# Patient Record
Sex: Male | Born: 1935 | Race: White | Hispanic: No | Marital: Married | Smoking: Never smoker
Health system: Southern US, Community
[De-identification: ages and names within clinical notes are randomized; demographics above are authoritative.]

## PROBLEM LIST (undated history)

## (undated) DIAGNOSIS — I2510 Atherosclerotic heart disease of native coronary artery without angina pectoris: Secondary | ICD-10-CM

## (undated) DIAGNOSIS — D696 Thrombocytopenia, unspecified: Secondary | ICD-10-CM

## (undated) DIAGNOSIS — C4492 Squamous cell carcinoma of skin, unspecified: Secondary | ICD-10-CM

## (undated) DIAGNOSIS — C61 Malignant neoplasm of prostate: Secondary | ICD-10-CM

## (undated) DIAGNOSIS — D469 Myelodysplastic syndrome, unspecified: Secondary | ICD-10-CM

## (undated) DIAGNOSIS — D649 Anemia, unspecified: Secondary | ICD-10-CM

## (undated) DIAGNOSIS — E785 Hyperlipidemia, unspecified: Secondary | ICD-10-CM

## (undated) DIAGNOSIS — K760 Fatty (change of) liver, not elsewhere classified: Secondary | ICD-10-CM

## (undated) DIAGNOSIS — I951 Orthostatic hypotension: Secondary | ICD-10-CM

## (undated) DIAGNOSIS — I10 Essential (primary) hypertension: Secondary | ICD-10-CM

## (undated) HISTORY — DX: Squamous cell carcinoma of skin, unspecified: C44.92

## (undated) HISTORY — PX: APPENDECTOMY: SHX54

## (undated) HISTORY — DX: Essential (primary) hypertension: I10

## (undated) HISTORY — DX: Anemia, unspecified: D64.9

## (undated) HISTORY — DX: Malignant neoplasm of prostate: C61

## (undated) HISTORY — DX: Hyperlipidemia, unspecified: E78.5

## (undated) HISTORY — PX: CARDIAC SURGERY: SHX584

## (undated) HISTORY — DX: Orthostatic hypotension: I95.1

## (undated) HISTORY — PX: INGUINAL HERNIA REPAIR: SUR1180

## (undated) HISTORY — DX: Fatty (change of) liver, not elsewhere classified: K76.0

## (undated) HISTORY — DX: Thrombocytopenia, unspecified: D69.6

## (undated) HISTORY — DX: Atherosclerotic heart disease of native coronary artery without angina pectoris: I25.10

## (undated) HISTORY — PX: CORONARY ANGIOPLASTY WITH STENT PLACEMENT: SHX49

---

## 1997-11-18 ENCOUNTER — Encounter: Admission: RE | Admit: 1997-11-18 | Discharge: 1998-02-16 | Payer: Self-pay | Admitting: Radiation Oncology

## 2001-12-31 ENCOUNTER — Ambulatory Visit (HOSPITAL_COMMUNITY): Admission: RE | Admit: 2001-12-31 | Discharge: 2001-12-31 | Payer: Self-pay | Admitting: Family Medicine

## 2001-12-31 ENCOUNTER — Encounter: Payer: Self-pay | Admitting: Family Medicine

## 2002-02-15 ENCOUNTER — Emergency Department (HOSPITAL_COMMUNITY): Admission: EM | Admit: 2002-02-15 | Discharge: 2002-02-15 | Payer: Self-pay | Admitting: Emergency Medicine

## 2002-02-15 ENCOUNTER — Encounter: Payer: Self-pay | Admitting: Emergency Medicine

## 2003-09-29 ENCOUNTER — Ambulatory Visit (HOSPITAL_COMMUNITY): Admission: RE | Admit: 2003-09-29 | Discharge: 2003-09-29 | Payer: Self-pay | Admitting: Family Medicine

## 2003-10-08 ENCOUNTER — Ambulatory Visit (HOSPITAL_COMMUNITY): Admission: RE | Admit: 2003-10-08 | Discharge: 2003-10-08 | Payer: Self-pay | Admitting: Family Medicine

## 2008-02-23 ENCOUNTER — Emergency Department (HOSPITAL_COMMUNITY): Admission: EM | Admit: 2008-02-23 | Discharge: 2008-02-23 | Payer: Self-pay | Admitting: Emergency Medicine

## 2008-07-27 ENCOUNTER — Ambulatory Visit: Payer: Self-pay | Admitting: Gastroenterology

## 2008-07-29 ENCOUNTER — Ambulatory Visit: Payer: Self-pay | Admitting: Gastroenterology

## 2008-07-29 ENCOUNTER — Ambulatory Visit (HOSPITAL_COMMUNITY): Admission: RE | Admit: 2008-07-29 | Discharge: 2008-07-29 | Payer: Self-pay | Admitting: Gastroenterology

## 2008-12-06 ENCOUNTER — Encounter (INDEPENDENT_AMBULATORY_CARE_PROVIDER_SITE_OTHER): Payer: Self-pay | Admitting: *Deleted

## 2008-12-06 LAB — CONVERTED CEMR LAB
AST: 21 units/L
Albumin: 4.7 g/dL
CO2: 25 meq/L
Calcium: 10.2 mg/dL
Glucose, Bld: 104 mg/dL
LDL Cholesterol: 78 mg/dL
Potassium: 4.9 meq/L
Sodium: 144 meq/L
Total Protein: 7.2 g/dL

## 2009-10-02 ENCOUNTER — Encounter: Payer: Self-pay | Admitting: Emergency Medicine

## 2009-10-02 ENCOUNTER — Encounter (INDEPENDENT_AMBULATORY_CARE_PROVIDER_SITE_OTHER): Payer: Self-pay | Admitting: *Deleted

## 2009-10-02 ENCOUNTER — Ambulatory Visit: Payer: Self-pay | Admitting: Cardiology

## 2009-10-02 ENCOUNTER — Inpatient Hospital Stay (HOSPITAL_COMMUNITY): Admission: EM | Admit: 2009-10-02 | Discharge: 2009-10-10 | Payer: Self-pay | Admitting: Cardiology

## 2009-10-12 ENCOUNTER — Encounter: Payer: Self-pay | Admitting: Cardiology

## 2009-10-13 LAB — CONVERTED CEMR LAB
HCT: 30.5 % — ABNORMAL LOW (ref 39.0–52.0)
Hemoglobin: 10 g/dL — ABNORMAL LOW (ref 13.0–17.0)
MCHC: 32.8 g/dL (ref 30.0–36.0)
MCV: 101 fL — ABNORMAL HIGH (ref 78.0–100.0)
Platelets: 138 10*3/uL — ABNORMAL LOW (ref 150–400)
RBC: 3.02 M/uL — ABNORMAL LOW (ref 4.22–5.81)
RDW: 13.7 % (ref 11.5–15.5)
WBC: 5.1 10*3/uL (ref 4.0–10.5)

## 2009-10-20 ENCOUNTER — Encounter (INDEPENDENT_AMBULATORY_CARE_PROVIDER_SITE_OTHER): Payer: Self-pay | Admitting: *Deleted

## 2009-10-24 ENCOUNTER — Ambulatory Visit: Payer: Self-pay | Admitting: Cardiology

## 2009-10-24 ENCOUNTER — Encounter: Payer: Self-pay | Admitting: Adult Health

## 2009-10-24 DIAGNOSIS — N259 Disorder resulting from impaired renal tubular function, unspecified: Secondary | ICD-10-CM | POA: Insufficient documentation

## 2009-10-25 ENCOUNTER — Encounter: Payer: Self-pay | Admitting: Adult Health

## 2009-10-25 ENCOUNTER — Encounter (INDEPENDENT_AMBULATORY_CARE_PROVIDER_SITE_OTHER): Payer: Self-pay | Admitting: *Deleted

## 2009-10-25 ENCOUNTER — Telehealth (INDEPENDENT_AMBULATORY_CARE_PROVIDER_SITE_OTHER): Payer: Self-pay

## 2009-10-25 LAB — CONVERTED CEMR LAB
BUN: 19 mg/dL
Basophils Absolute: 0 10*3/uL (ref 0.0–0.1)
Basophils Relative: 0 % (ref 0–1)
Calcium: 9.2 mg/dL
Calcium: 9.2 mg/dL (ref 8.4–10.5)
Chloride: 106 meq/L
Creatinine, Ser: 1.04 mg/dL
Glucose, Bld: 88 mg/dL (ref 70–99)
HCT: 36.7 %
Hemoglobin: 11.9 g/dL
Hemoglobin: 11.9 g/dL — ABNORMAL LOW (ref 13.0–17.0)
Lymphocytes Relative: 37 % (ref 12–46)
MCHC: 32.4 g/dL (ref 30.0–36.0)
MCV: 100.8 fL
Monocytes Absolute: 0.5 10*3/uL (ref 0.1–1.0)
Neutro Abs: 3.1 10*3/uL (ref 1.7–7.7)
Neutrophils Relative %: 52 % (ref 43–77)
Platelets: 172 10*3/uL
Potassium: 4.6 meq/L (ref 3.5–5.3)
RBC: 3.64 M/uL — ABNORMAL LOW (ref 4.22–5.81)
RDW: 14 % (ref 11.5–15.5)
Sodium: 141 meq/L (ref 135–145)
WBC: 6.1 10*3/uL

## 2009-11-07 ENCOUNTER — Ambulatory Visit: Payer: Self-pay | Admitting: Cardiology

## 2009-11-08 ENCOUNTER — Encounter: Payer: Self-pay | Admitting: Cardiology

## 2009-11-10 ENCOUNTER — Encounter (INDEPENDENT_AMBULATORY_CARE_PROVIDER_SITE_OTHER): Payer: Self-pay | Admitting: *Deleted

## 2009-11-21 DIAGNOSIS — I447 Left bundle-branch block, unspecified: Secondary | ICD-10-CM | POA: Insufficient documentation

## 2009-11-21 DIAGNOSIS — I2510 Atherosclerotic heart disease of native coronary artery without angina pectoris: Secondary | ICD-10-CM

## 2009-11-22 ENCOUNTER — Ambulatory Visit: Payer: Self-pay | Admitting: Cardiology

## 2009-12-12 ENCOUNTER — Encounter (HOSPITAL_COMMUNITY): Admission: RE | Admit: 2009-12-12 | Discharge: 2010-01-11 | Payer: Self-pay | Admitting: Cardiology

## 2009-12-16 ENCOUNTER — Encounter: Payer: Self-pay | Admitting: Cardiology

## 2010-01-04 ENCOUNTER — Telehealth (INDEPENDENT_AMBULATORY_CARE_PROVIDER_SITE_OTHER): Payer: Self-pay | Admitting: *Deleted

## 2010-02-09 ENCOUNTER — Encounter: Payer: Self-pay | Admitting: Cardiology

## 2010-07-20 ENCOUNTER — Ambulatory Visit (HOSPITAL_COMMUNITY)
Admission: RE | Admit: 2010-07-20 | Discharge: 2010-07-20 | Payer: Self-pay | Source: Home / Self Care | Admitting: Ophthalmology

## 2010-08-03 ENCOUNTER — Ambulatory Visit (HOSPITAL_COMMUNITY)
Admission: RE | Admit: 2010-08-03 | Discharge: 2010-08-03 | Payer: Self-pay | Source: Home / Self Care | Attending: Ophthalmology | Admitting: Ophthalmology

## 2010-08-18 ENCOUNTER — Ambulatory Visit: Payer: Self-pay | Admitting: Cardiology

## 2010-08-18 ENCOUNTER — Encounter (INDEPENDENT_AMBULATORY_CARE_PROVIDER_SITE_OTHER): Payer: Self-pay | Admitting: *Deleted

## 2010-08-22 ENCOUNTER — Encounter: Payer: Self-pay | Admitting: Cardiology

## 2010-08-22 ENCOUNTER — Encounter (INDEPENDENT_AMBULATORY_CARE_PROVIDER_SITE_OTHER): Payer: Self-pay | Admitting: *Deleted

## 2010-08-22 LAB — CONVERTED CEMR LAB
ALT: 16 units/L (ref 0–53)
AST: 16 units/L (ref 0–37)
Albumin: 4.3 g/dL (ref 3.5–5.2)
BUN: 17 mg/dL
BUN: 17 mg/dL (ref 6–23)
Basophils Relative: 1 %
Basophils Relative: 1 % (ref 0–1)
CO2: 27 meq/L
CO2: 27 meq/L (ref 19–32)
Calcium: 9.9 mg/dL (ref 8.4–10.5)
Chloride: 108 meq/L
Chloride: 108 meq/L (ref 96–112)
Cholesterol: 151 mg/dL (ref 0–200)
Creatinine, Ser: 1.09 mg/dL
Eosinophils Absolute: 0.3 10*3/uL
Glucose, Bld: 108 mg/dL
HCT: 37.2 %
HDL: 32 mg/dL — ABNORMAL LOW (ref >39)
Hemoglobin: 12.3 g/dL
Hemoglobin: 12.3 g/dL — ABNORMAL LOW (ref 13.0–17.0)
Lymphocytes Relative: 38 % (ref 12–46)
MCHC: 33.1 g/dL (ref 30.0–36.0)
MCV: 103.3 fL
Monocytes Absolute: 0.4 10*3/uL
Monocytes Absolute: 0.4 10*3/uL (ref 0.1–1.0)
Monocytes Relative: 8 %
Monocytes Relative: 8 % (ref 3–12)
Neutro Abs: 2.8 10*3/uL (ref 1.7–7.7)
Neutrophils Relative %: 49 % (ref 43–77)
Potassium: 4.1 meq/L (ref 3.5–5.3)
RBC: 3.6 M/uL — ABNORMAL LOW (ref 4.22–5.81)
TSH: 4.195 microintl units/mL (ref 0.350–4.500)
WBC: 5.7 10*3/uL
WBC: 5.7 10*3/uL (ref 4.0–10.5)

## 2010-08-23 ENCOUNTER — Other Ambulatory Visit: Payer: Self-pay | Admitting: Cardiology

## 2010-08-23 ENCOUNTER — Ambulatory Visit (HOSPITAL_COMMUNITY)
Admission: RE | Admit: 2010-08-23 | Discharge: 2010-08-23 | Payer: Self-pay | Source: Home / Self Care | Attending: Cardiology | Admitting: Cardiology

## 2010-08-31 ENCOUNTER — Encounter: Payer: Self-pay | Admitting: Cardiology

## 2010-08-31 ENCOUNTER — Ambulatory Visit
Admission: RE | Admit: 2010-08-31 | Discharge: 2010-08-31 | Payer: Self-pay | Source: Home / Self Care | Attending: Cardiology | Admitting: Cardiology

## 2010-09-15 ENCOUNTER — Encounter (INDEPENDENT_AMBULATORY_CARE_PROVIDER_SITE_OTHER): Payer: Self-pay | Admitting: *Deleted

## 2010-09-18 ENCOUNTER — Ambulatory Visit
Admission: RE | Admit: 2010-09-18 | Discharge: 2010-09-18 | Payer: Self-pay | Source: Home / Self Care | Attending: Cardiology | Admitting: Cardiology

## 2010-09-18 ENCOUNTER — Encounter (INDEPENDENT_AMBULATORY_CARE_PROVIDER_SITE_OTHER): Payer: Self-pay | Admitting: *Deleted

## 2010-09-19 NOTE — Miscellaneous (Signed)
Summary: [facility] Order/Treatment Plan   [facility] Order/Treatment Plan   Imported By: Roderic Ovens 01/18/2010 11:48:54  _____________________________________________________________________  External Attachment:    Type:   Image     Comment:   External Document

## 2010-09-19 NOTE — Miscellaneous (Signed)
Summary: labs cbcd,bmp,10/25/2009  Clinical Lists Changes  Observations: Added new observation of CALCIUM: 9.2 mg/dL (16/05/9603 54:09) Added new observation of CREATININE: 1.04 mg/dL (81/19/1478 29:56) Added new observation of BUN: 19 mg/dL (21/30/8657 84:69) Added new observation of BG RANDOM: 88 mg/dL (62/95/2841 32:44) Added new observation of CO2 PLSM/SER: 22 meq/L (10/25/2009 16:14) Added new observation of CL SERUM: 106 meq/L (10/25/2009 16:14) Added new observation of K SERUM: 4.6 meq/L (10/25/2009 16:14) Added new observation of NA: 141 meq/L (10/25/2009 16:14) Added new observation of PLATELETK/UL: 172 K/uL (10/25/2009 16:14) Added new observation of MCV: 100.8 fL (10/25/2009 16:14) Added new observation of HCT: 36.7 % (10/25/2009 16:14) Added new observation of HGB: 11.9 g/dL (08/22/7251 66:44) Added new observation of WBC COUNT: 6.1 10*3/microliter (10/25/2009 16:14)

## 2010-09-19 NOTE — Assessment & Plan Note (Signed)
Summary: 2 wk nurse visit per checkout on 10/24/09/tg  Nurse Visit   Vital Signs:  Patient profile:   77 year old male Height:      72 inches Weight:      187 pounds O2 Sat:      96 % on Room air Pulse rate:   48 / minute BP sitting:   121 / 72  (left arm)  Vitals Entered By: Teressa Lower RN (November 07, 2009 4:33 PM)  O2 Flow:  Room air  Visit Type:  2 week nurse visit Primary Provider:  Dr.Mcgough   History of Present Illness: weakness pt brought bp diary 28 readings: average bp 119/76,  sbp >130:  3, dbp>85:  3   Current Medications (verified): 1)  Lipitor 40 Mg Tabs (Atorvastatin Calcium) .... Take 1 Tab Daily 2)  Coreg 3.125 Mg Tabs (Carvedilol) .... Take 1 Tab Two Times A Day 3)  Plavix 75 Mg Tabs (Clopidogrel Bisulfate) .... Take 1 Tab Daily 4)  Vitamin B-12 100 Mcg Tabs (Cyanocobalamin) .... Take 1 Tab Daily 5)  Nitrostat 0.4 Mg Subl (Nitroglycerin) .... Take As Needed For Chest Pain 6)  Allopurinol 300 Mg Tabs (Allopurinol) .... Take 1/2 Tab Daily 7)  Gnp One Daily Plus Iron  Tabs (Multiple Vitamins-Iron) .... Take 1 Tablet By Mouth Once A Day 8)  Ambien 10 Mg Tabs (Zolpidem Tartrate) .... Take 1 Tab At Bedtime 9)  Centrum Silver  Tabs (Multiple Vitamins-Minerals) .... Take 1 Tablet By Mouth Once A Day  Allergies (verified): 1)  ! Aspir-Trin (Aspirin)

## 2010-09-19 NOTE — Miscellaneous (Signed)
Summary: DISCHARGE SUMMARY  DISCHARGE SUMMARY   Imported By: Faythe Ghee 02/09/2010 10:01:06  _____________________________________________________________________  External Attachment:    Type:   Image     Comment:   External Document

## 2010-09-19 NOTE — Progress Notes (Signed)
Summary: Lab results  Phone Note Outgoing Call   Call placed by: Larita Fife Via LPN,  October 25, 2009 8:41 AM Reason for Call: Discuss lab or test results Summary of Call: Patient's daughter advised of Mr. Sequeira improved lab results, no med change and not to drive until after OV with Dr. Dietrich Pates on 11-22-09. She states she understands info. given and will notify  pt.

## 2010-09-19 NOTE — Miscellaneous (Signed)
Summary: LABS CMP,LIPIDS 12/06/2008  Clinical Lists Changes  Observations: Added new observation of CALCIUM: 10.2 mg/dL (16/05/9603 54:09) Added new observation of ALBUMIN: 4.7 g/dL (81/19/1478 29:56) Added new observation of PROTEIN, TOT: 7.2 g/dL (21/30/8657 84:69) Added new observation of SGPT (ALT): 22 units/L (12/06/2008 16:37) Added new observation of SGOT (AST): 21 units/L (12/06/2008 16:37) Added new observation of ALK PHOS: 70 units/L (12/06/2008 16:37) Added new observation of CREATININE: 1.05 mg/dL (62/95/2841 32:44) Added new observation of BUN: 18 mg/dL (08/22/7251 66:44) Added new observation of BG RANDOM: 104 mg/dL (03/47/4259 56:38) Added new observation of CO2 PLSM/SER: 25 meq/L (12/06/2008 16:37) Added new observation of CL SERUM: 105 meq/L (12/06/2008 16:37) Added new observation of K SERUM: 4.9 meq/L (12/06/2008 16:37) Added new observation of NA: 144 meq/L (12/06/2008 16:37) Added new observation of LDL: 78 mg/dL (75/64/3329 51:88) Added new observation of HDL: 36 mg/dL (41/66/0630 16:01) Added new observation of TRIGLYC TOT: 271 mg/dL (09/32/3557 32:20) Added new observation of CHOLESTEROL: 168 mg/dL (25/42/7062 37:62)

## 2010-09-19 NOTE — Letter (Signed)
Summary: BP READINGS  BP READINGS   Imported By: Faythe Ghee 11/08/2009 10:19:28  _____________________________________________________________________  External Attachment:    Type:   Image     Comment:   External Document

## 2010-09-19 NOTE — Assessment & Plan Note (Signed)
Summary: 1 mth f/u per checkout on 10/24/09/tg   Visit Type:  Follow-up Primary Provider:  Dr. Karleen Hampshire   History of Present Illness: Return visit as scheduled for this very pleasant 77 year old gentleman with coronary artery disease, ischemic cardiomyopathy and recent admission for multivessel percutaneous intervention.  He continues to do well with no cardiopulmonary symptoms.  He has refrained from driving per instructions given to him at the time of hospital discharge.  Lifestyle is sedentary.  Peri-procedure complications included a mild elevation in creatinine following intravenous contrast and a transient thrombocytopenia.  Current Medications (verified): 1)  Lipitor 40 Mg Tabs (Atorvastatin Calcium) .... Take 1 Tab Daily 2)  Plavix 75 Mg Tabs (Clopidogrel Bisulfate) .... Take 1 Tab Daily 3)  Vitamin B-12 100 Mcg Tabs (Cyanocobalamin) .... Take 1 Tab Daily 4)  Nitrostat 0.4 Mg Subl (Nitroglycerin) .... Take As Needed For Chest Pain 5)  Allopurinol 300 Mg Tabs (Allopurinol) .... Take 1/2 Tab Daily 6)  Gnp One Daily Plus Iron  Tabs (Multiple Vitamins-Iron) .... Take 1 Tablet By Mouth Once A Day 7)  Ambien 10 Mg Tabs (Zolpidem Tartrate) .... Take 1 Tab At Bedtime 8)  Centrum Silver  Tabs (Multiple Vitamins-Minerals) .... Take 1 Tablet By Mouth Once A Day  Allergies (verified): 1)  ! Aspir-Trin (Aspirin)  Past History:  PMH, FH, and Social History reviewed and updated.  Review of Systems       The patient complains of decreased hearing.  The patient denies weight loss, weight gain, vision loss, hoarseness, chest pain, syncope, dyspnea on exertion, peripheral edema, prolonged cough, headaches, hemoptysis, abdominal pain, melena, and hematochezia.    Vital Signs:  Patient profile:   77 year old male Weight:      188 pounds Pulse rate:   76 / minute Pulse (ortho):   83 / minute BP sitting:   129 / 73  (right arm) BP standing:   89 / 64  Vitals Entered By: Dreama Saa,  CNA (November 22, 2009 12:59 PM)  Serial Vital Signs/Assessments:  Time      Position  BP       Pulse  Resp  Temp     By 1:58 PM   Lying LA  124/80   76                    Lynn Via LPN 1:91 PM   Sitting   127/72   65                    Lynn Via LPN 4:78 PM   Standing  89/64    83                    Lynn Via LPN  Comments: 2:95 PM no s/s By: Larita Fife Via LPN    Physical Exam  General:    Mildly overweight; well developed; no acute distress:   Neck-No JVD; no carotid bruits: Lungs-No tachypnea, no rales; no rhonchi; no wheezes: Cardiovascular-normal PMI; normal S1 and widely split S2; modest basilar systolic ejection murmur. Abdomen-BS normal; soft and non-tender without masses or organomegaly:  Musculoskeletal-No deformities, no cyanosis or clubbing: Neurologic-Normal cranial nerves; symmetric strength and tone:  Skin-Warm, no significant lesions; multiple keratoses Extremities-Nl distal pulses on the right; 1+ pulses on the left; no edema:     Impression & Recommendations:  Problem # 1:  ORTHOSTATIC HYPOTENSION (ICD-458.0) There is a substantial decrease in systolic blood pressure when patient  assumes the standing position, but no symptoms.  He denies any dizziness or syncope since hospital discharge.  I instructed him in the need to immediately assumed a sitting or lying position should such symptoms develop.  We discussed the need to maintain a reasonable salt and a good fluid intake.  Carvedilol, which is being given in a subtherapeutic dosage, will be discontinued.  Problem # 2:  ATHEROSCLEROTIC CARDIOVASCULAR DISEASE (ICD-429.2) Mr. Boardley has been quite stable since his stents were placed.  Therapy will continue on clopidogrel alone due to his aspirin allergy.  He will call for any recurrent symptoms.  Problem # 3:  RENAL INSUFFICIENCY (ICD-588.9)  Repeat BUN and creatinine one month ago were normal.  Problem # 4:  Thrombocytopenia Repeat platelet count was normal at  172,000 one month ago.  Problem # 5:  HYPERLIPIDEMIA (ICD-272.4) Lipid profile was good except for increased triglycerides one month ago.  I will reassess this nice gentleman in 6 months.  Patient Instructions: 1)  Your physician recommends that you schedule a follow-up appointment in: 6 months 2)  Your physician has recommended you make the following change in your medication: stop coreg 3)  [address]

## 2010-09-19 NOTE — Progress Notes (Signed)
Summary: lipitor causing weakness  Phone Note Call from Patient Call back at [phone]   Caller: bonnie Bartley "daughter" Reason for Call: Talk to Nurse Summary of Call: S: pt was put on lipitor not to long ago and now he is weak and having muscle pains. He just doesnt feel like doing anything and is hard for him to dp rehab. Is there another drug he can try? Initial call taken by: Faythe Ghee,  Jan 04, 2010 2:29 PM  Follow-up for Phone Call        B:pt was placed on lipitor 40mg  daily in Feb 2011 A:  leg cramps and weakness, unable to do his rehab, would like to be place on something else for his cholesterol R: Follow-up by: Teressa Lower RN,  Jan 04, 2010 5:38 PM  Additional Follow-up for Phone Call Additional follow up Details #1::        Change to pravachol 20 mg QD Additional Follow-up by: Kathlen Brunswick, MD, [facility],  Jan 05, 2010 8:17 AM    New/Updated Medications: PRAVASTATIN SODIUM 20 MG TABS (PRAVASTATIN SODIUM) Take one tablet by mouth daily at bedtime Prescriptions: PRAVASTATIN SODIUM 20 MG TABS (PRAVASTATIN SODIUM) Take one tablet by mouth daily at bedtime  #30 x 3   Entered by:   Teressa Lower RN   Authorized by:   Kathlen Brunswick, MD, [employer]   Signed by:   Teressa Lower RN on 01/05/2010   Method used:   Electronically to        [facility] (retail)       [address]       [place], [place]  [address]       Ph: [phone]       Fax: [phone]   RxID:   [id number]

## 2010-09-19 NOTE — Miscellaneous (Signed)
Summary: LABS TSH 10/02/2009  Clinical Lists Changes  Observations: Added new observation of TSH: 2.318 microintl units/mL (10/02/2009 16:36)

## 2010-09-19 NOTE — Assessment & Plan Note (Signed)
Summary: POST HOSP [facility] PER CHRIS BERGE/TG   Visit Type:  Follow-up Primary Provider:  Dr.Mcgough  CC:  real fatigued.  History of Present Illness: Mr. Mutschler is a pleasant 77 CM we are seeing on follow-up after admission for chest discomfort with cardiac cath showing severe CAD.  He underwent Left heart cardiac catheterization, October 03, 2009, revealing multivessel disease including a 99% stenosis in the proximal LAD,subtotal occlusion of a small ostial ramus intermedius, 90/99% stenosis in a large obtuse marginal, 95% mid and 95% distal stenoses in a small right coronary artery.  EF 35% with anterior  distal and apical akinesis. On October 03, 2009, PCI and stenting of the LAD with placement of  2.5 x a 18-mm PROMUS drug-eluting stent  On October 08, 2009, successful PCI and stenting of the first obtuse marginal with placement of a 2.5 x 18-mm PROMUS drug-elutin stent.  PCI and stenting of the mid-right coronary artery with  placement of a 2.5 x 18-mm PROMUS drug-eluting stent.  PTCA of the distal RCA as interventional team was unable to pass stents.  He was also to found to have thrombocytopenia but negative HIT.   He was found to have some iron deficiency and has been placed on iron supplements per Dr. Regino Schultze.  He has taken a fall about 2 weeks ago after returning home.  He "lost his step" while trying to sit on the commonde and fell forward onto tub.  Bruised up his face, forhead and eye orbits.  He denied loss of conciousness and dizziness.  He did not seek medical attention for this.  He said he "saw stars" for a few minutes. Has not had headache or dizziness, blurred vision of difficulty in gait since that time.  His main complaint is fatigue and being tired.  I have asked for orthostatic BP's here in the office.  His Lying BP is 107/70 HR 91:  Sitting 107/68 HR 91; Standing 89/58 HR 92.  He was asymptomatic with lowest BP.      Current Medications (verified): 1)  Lipitor 40 Mg Tabs  (Atorvastatin Calcium) .... Take 1 Tab Daily 2)  Coreg 3.125 Mg Tabs (Carvedilol) .... Take 1 Tab Two Times A Day 3)  Plavix 75 Mg Tabs (Clopidogrel Bisulfate) .... Take 1 Tab Daily 4)  Vitamin B-12 100 Mcg Tabs (Cyanocobalamin) .... Take 1 Tab Daily 5)  Nitrostat 0.4 Mg Subl (Nitroglycerin) .... Take As Needed For Chest Pain 6)  Allopurinol 300 Mg Tabs (Allopurinol) .... Take 1/2 Tab Daily 7)  Daily Multi  Tabs (Multiple Vitamins-Minerals) .... Take 1 Tab Daily 8)  Ambien 10 Mg Tabs (Zolpidem Tartrate) .... Take 1 Tab At Bedtime  Allergies (verified): No Known Drug Allergies  Past History:  Past medical, surgical, family and social histories (including risk factors) reviewed, and no changes noted (except as noted below).  Past Medical History: Reviewed history from 10/20/2009 and no changes required. Current Problems:  HYPERLIPIDEMIA (ICD-272.4) HYPERTENSION (ICD-401.9)  Past Surgical History: Reviewed history from 10/20/2009 and no changes required. Right Inguinal hernia repair appendectomy coronary angioplasty with placement of a drug eluting stent coronary angioplasty with balloon angioplasty only of the distal right coronary artery  Family History: Reviewed history and no changes required.  Social History: Reviewed history from 10/20/2009 and no changes required. Retired  Married  Tobacco Use - No.  Alcohol Use - no Regular Exercise - no Drug Use - no  Review of Systems       Fatigue  All  other systems have been reviewed and are negative unless stated above.   Vital Signs:  Patient profile:   77 year old male Height:      72 inches Weight:      188 pounds Pulse rate:   86 / minute BP sitting:   95 / 61  (right arm)  Vitals Entered By: Dreama Saa, CNA (October 24, 2009 12:48 PM)  Physical Exam  General:  Well developed, well nourished, in no acute distress. Head:  Normalcephalic. Eyes:  Ecchymosis around eye orbits bilaterally and on cheeks. Nose:   Bridge of nose with ecchymosis. Lungs:  Clear bilaterally to auscultation and percussion. Heart:  Non-displaced PMI, chest non-tender; regular rate and rhythm, S1, S2 without murmurs, rubs or gallops. Carotid upstroke normal, no bruit. Normal abdominal aortic size, no bruits. Femorals normal pulses, no bruits. Pedals normal pulses. No edema, no varicosities. Abdomen:  Bowel sounds positive; abdomen soft and non-tender without masses, organomegaly, or hernias noted. No hepatosplenomegaly. Msk:  See facial assessment Neurologic:  Alert and oriented x 3. Psych:  Normal affect.   Impression & Recommendations:  Problem # 1:  ACUTE COR OCCLUSION WITHOUT MYOCARDIAL INFARCT (ICD-411.81) Assessment Deteriorated NSTEMI in February of 2011 with PCI to LAD, Mid and distal RCA and OM.  He has been placed on plavix.  Unable to take aspirin.  I am concerned about his fall onto tub with facial trauma on Plavix.  I have counselled him on being careful with use of antithrombotic.  He is asymptomatic concerning sob or chest pain.  Problem # 2:  ORTHOSTATIC HYPOTENSION (ICD-458.0) With known history of anemia and fatigue, I want to check a CBC and evaluate for low levels.  We may need to think about lowering BB dose or changing to lopressor 12.5mg  daily.  His HR remains elevated in the 90's.,  This may be to hypovolemia.  He is given Rx for blood pressure machine, and told be very careful with postional changes.  He is not to drive until this has been evaluated further.  Problem # 3:  RENAL INSUFFICIENCY (ICD-588.9) BMET for follow-up with be completed. Orders: T-Basic Metabolic Panel [id number])  Other Orders: T-CBC w/Diff ([id number]) Durable Medical Equipment (DME)  Patient Instructions: 1)  Your physician recommends that you schedule a follow-up appointment in:2 weeks for blood pressure check and in  1 month with MD 2)  Your physician recommends that you return for lab work in: today

## 2010-09-21 NOTE — Letter (Signed)
Summary: [facility] Work [facility], agricultural at [employer]  [address] [place] [address]   Phone: [phone]  Fax: [phone]     August 18, 2010 MRN: [id number]   Lee Ramirez [address] [place], [place]  [address]      YOUR LAB WORK IS DUE   TUESDAY, August 22, 2010  Please go to [facility], located across the street from [facility] on the second floor.  Hours are Monday - Friday 7am until 7:30pm         Saturday 8am until 12noon    _X_  DO NOT EAT OR DRINK AFTER MIDNIGHT EVENING PRIOR TO LABWORK

## 2010-09-21 NOTE — Letter (Signed)
Summary: [place] [facility], agricultural at [facility]  [address] [place] [address]   Phone: [phone]  Fax: [phone]      August 22, 2010 MRN: [id number]   Lee Ramirez [address] [place], [place]  [address]   Dear [name],  Your test ordered by Selena Batten has been reviewed by your physician (or physician assistant) and was found to be normal or stable. Your physician (or physician assistant) felt no changes were needed at this time.  ____ Echocardiogram  ____ Cardiac Stress Test  __X__ Lab Work  ____ Peripheral vascular study of arms, legs or neck  ____ CT scan or X-ray  ____ Lung or Breathing test  ____ Other:  No change in medical treatment at this time, per Dr. Dietrich Pates.  Thank you, Ashdon Gillson Allyne Gee RN    Corinth Bing, MD, [facility] Arena.C.Gaylord Shih, MD, F.A.C.C Lewayne Bunting, MD, F.A.C.C Nona Dell, MD, F.A.C.C Charlton Haws, MD, [facility]ise Arena.C.C

## 2010-09-21 NOTE — Assessment & Plan Note (Signed)
Summary: NURSE VISIT ORTHO AND VIT/TMJ  Nurse Visit   Vital Signs:  Patient profile:   77 year old male Weight:      191 pounds O2 Sat:      96 % Pulse rate:   82 / minute Pulse (ortho):   86 / minute BP sitting:   148 / 85  (left arm) BP standing:   105 / 69  Vitals Entered ByLarita Fife Via LPN (August 31, 2010 4:26 PM)  Serial Vital Signs/Assessments:  Time      Position  BP       Pulse  Resp  Temp     By 4:54 PM   Lying LA  140/81   78                    Lynn Via LPN 1:61 PM   Sitting   139/77   76                    Lynn Via LPN 0:96 PM   Standing  105/69   86                    Lynn Via LPN  Comments: 0:45 PM no s/s By: Larita Fife Via LPN    Current Medications (verified): 1)  Pravastatin Sodium 20 Mg Tabs (Pravastatin Sodium) .... Take One Tablet By Mouth Daily At Bedtime 2)  Plavix 75 Mg Tabs (Clopidogrel Bisulfate) .... Take 1 Tab Daily 3)  Vitamin B-12 100 Mcg Tabs (Cyanocobalamin) .... Take 1 Tab Daily 4)  Nitrostat 0.4 Mg Subl (Nitroglycerin) .... Take As Needed For Chest Pain 5)  Allopurinol 300 Mg Tabs (Allopurinol) .... Take 1/2 Tab Daily 6)  Ambien 10 Mg Tabs (Zolpidem Tartrate) .... Take 1 Tab At Bedtime 7)  Centrum Silver  Tabs (Multiple Vitamins-Minerals) .... Take 1 Tablet By Mouth Once A Day 8)  Fludrocortisone Acetate 0.1 Mg Tabs (Fludrocortisone Acetate) .... Take 1 Tablet By Mouth Once A Day  Allergies (verified): 1)  ! Aspir-Trin  Primary Provider:  Dr. Karleen Hampshire   History of Present Illness: S: Pt. arrives in office for 2 week BP and weight check with nurse. B: On last OV with Dr. Dietrich Pates on 08-18-10 pt. was advised to start taking Fludrocortisone0.1mg  once daily for orthostatic hypotention and to monitor and record BP and weights daily.  A: Pt. brought in BP and weight diary (scanned into chart). BP today =148/85 and on 08-18-10 BP was 111/68. Weight is 191lbs. today and was 191lbs. on 12-30. Pt. c/o slight dizziness but states s/s are much  improved since starting Fludrocortisone (orthostatic BP's scanned into chart), otherwise he has no complaints at this time. Pt. is scheduled to f/u with Dr. Dietrich Pates on 09-18-10. R: Pt. advised that we will contact him with Dr. Marvel Plan recommendations, if any.   09/02/2010  Note and BP log reviewed.  Systolics above 150 approximately 50% of time with peak of 177.  Pt is still orthostatic, but sxs are acceptable.  Continue current Rx.  McKenzie Bing, M.D.   Appended Document: NURSE VISIT ORTHO AND VIT/TMJ Pt. advised.     {USER.REALNAME}  {DATETIMESTAMP()}

## 2010-09-21 NOTE — Miscellaneous (Signed)
Summary: labs cbcd,cmp,lipids,08/22/2010  Clinical Lists Changes  Observations: Added new observation of CALCIUM: 9.9 mg/dL (16/05/9603 54:09) Added new observation of ALBUMIN: 4.3 g/dL (81/19/1478 29:56) Added new observation of PROTEIN, TOT: 6.9 g/dL (21/30/8657 84:69) Added new observation of SGPT (ALT): 16 units/L (08/22/2010 15:40) Added new observation of SGOT (AST): 16 units/L (08/22/2010 15:40) Added new observation of ALK PHOS: 48 units/L (08/22/2010 15:40) Added new observation of CREATININE: 1.09 mg/dL (62/95/2841 32:44) Added new observation of BUN: 17 mg/dL (08/22/7251 66:44) Added new observation of BG RANDOM: 108 mg/dL (03/47/4259 56:38) Added new observation of CO2 PLSM/SER: 27 meq/L (08/22/2010 15:40) Added new observation of CL SERUM: 108 meq/L (08/22/2010 15:40) Added new observation of K SERUM: 4.1 meq/L (08/22/2010 15:40) Added new observation of NA: 145 meq/L (08/22/2010 15:40) Added new observation of HDL: 32 mg/dL (75/64/3329 51:88) Added new observation of TRIGLYC TOT: 411 mg/dL (41/66/0630 16:01) Added new observation of CHOLESTEROL: 151 mg/dL (09/32/3557 32:20) Added new observation of ABSOLUTE BAS: 0.0 K/uL (08/22/2010 15:40) Added new observation of BASOPHIL %: 1 % (08/22/2010 15:40) Added new observation of EOS ABSLT: 0.3 K/uL (08/22/2010 15:40) Added new observation of % EOS AUTO: 5 % (08/22/2010 15:40) Added new observation of ABSOLUTE MON: 0.4 K/uL (08/22/2010 15:40) Added new observation of MONOCYTE %: 8 % (08/22/2010 15:40) Added new observation of ABS LYMPHOCY: 2.1 K/uL (08/22/2010 15:40) Added new observation of LYMPHS %: 38 % (08/22/2010 15:40) Added new observation of PLATELETK/UL: 114 K/uL (08/22/2010 15:40) Added new observation of RDW: 13.7 % (08/22/2010 15:40) Added new observation of MCHC RBC: 33.1 g/dL (25/42/7062 37:62) Added new observation of MCV: 103.3 fL (08/22/2010 15:40) Added new observation of HCT: 37.2 % (08/22/2010 15:40) Added  new observation of HGB: 12.3 g/dL (83/15/1761 60:73) Added new observation of RBC M/UL: 3.60 M/uL (08/22/2010 15:40) Added new observation of WBC COUNT: 5.7 10*3/microliter (08/22/2010 15:40)

## 2010-09-21 NOTE — Letter (Signed)
Summary: BP LOG  BP LOG   Imported By: Faythe Ghee 08/31/2010 16:28:31  _____________________________________________________________________  External Attachment:    Type:   Image     Comment:   External Document

## 2010-09-21 NOTE — Assessment & Plan Note (Signed)
Summary: Z6X   Visit Type:  Follow-up Primary Lee Ramirez:  Dr. Karleen Hampshire   History of Present Illness: [name] returns to the office as scheduled for continued assessment and treatment of severe coronary artery disease.  He has done remarkably well following a complex multivessel multilesion intervention, denying any chest pain or dyspnea.  Lifestyle is fairly sedentary.  He experiences weakness when he walks up a flight of stairs but no breathlessness.  His major symptom continues to be orthostatic dizziness and orthostatic weakness.  Fortunately, he has experienced no loss of consciousness or falls.  He carries nitroglycerin but has not needed to use it.  Current Medications (verified): 1)  Pravastatin Sodium 20 Mg Tabs (Pravastatin Sodium) .... Take One Tablet By Mouth Daily At Bedtime 2)  Plavix 75 Mg Tabs (Clopidogrel Bisulfate) .... Take 1 Tab Daily 3)  Vitamin B-12 100 Mcg Tabs (Cyanocobalamin) .... Take 1 Tab Daily 4)  Nitrostat 0.4 Mg Subl (Nitroglycerin) .... Take As Needed For Chest Pain 5)  Allopurinol 300 Mg Tabs (Allopurinol) .... Take 1/2 Tab Daily 6)  Ambien 10 Mg Tabs (Zolpidem Tartrate) .... Take 1 Tab At Bedtime 7)  Centrum Silver  Tabs (Multiple Vitamins-Minerals) .... Take 1 Tablet By Mouth Once A Day 8)  Fludrocortisone Acetate 0.1 Mg Tabs (Fludrocortisone Acetate) .... Take 1 Tablet By Mouth Once A Day  Allergies (verified): 1)  ! Aspir-Trin  Comments:  Nurse/Medical Assistant: patient brought all meds he uses Estate agent for pharmacy  Past History:  PMH, FH, and Social History reviewed and updated.  Past Medical History: ASCVD: Sizable non-Q. myocardial infarction in 09/2009; severe three-vessel disease with an ejection fraction of 35%; three-vessel DES complicated by thrombocytopenia and mild renal insufficiency HYPERLIPIDEMIA (ICD-272.4) HYPERTENSION (ICD-401.9) Anemia Carcinoma of the prostate-status post radiation therapy  complicated by proctitis Orthostatic hypotension with lightheadedness  Review of Systems       See history of present illness.  Vital Signs:  Patient profile:   77 year old male Weight:      191 pounds BMI:     26.00 O2 Sat:      96 % on Room air Pulse rate:   80 / minute Pulse (ortho):   89 / minute BP sitting:   111 / 68  (right arm) BP standing:   97 / 63  Vitals Entered By: Dreama Saa, CNA (August 18, 2010 11:35 AM)  O2 Flow:  Room air  Serial Vital Signs/Assessments:  Time      Position  BP       Pulse  Resp  Temp     By 11:48 AM  Lying LA  150/82   81                    Lynn Via LPN 09:60 AM  Sitting   146/86   54                    Lynn Via LPN 45:40 AM  Standing  97/63    89                    Lynn Via LPN  Comments: 98:11 AM pt. co/ dizziness when standing from sitting position. By: Larita Fife Via LPN    Physical Exam  General:    Mildly overweight; well developed; no acute distress; pallor noted   Neck-No JVD; no carotid bruits: Lungs-No tachypnea, no rales; no rhonchi; no wheezes: Cardiovascular-normal PMI; normal S1& S2;  modest basilar systolic ejection murmur. Abdomen-BS normal; soft and non-tender without masses or organomegaly:  Musculoskeletal-No deformities, no cyanosis or clubbing: Neurologic-Normal cranial nerves; symmetric strength and tone:  Skin-Warm, no significant lesions; multiple keratoses Extremities-distal pulses intact; no edema:     Impression & Recommendations:  Problem # 1:  ATHEROSCLEROTIC CARDIOVASCULAR DISEASE (ICD-429.2) Patient remains asymptomatic, now 9 months following percutaneous intervention.  His antiplatelet regimen will be continued for at least a total of one year. An echocardiogram will be performed to reassess left ventricular systolic function now that adequate blood flow has been restored.  Problem # 2:  ORTHOSTATIC HYPOTENSION (ICD-458.0) Patient remains orthostatic and symptomatic.  Unfortunately, he is  currently being treated with no medications that would likely exacerbate this process except for p.r.n. nitroglycerin.  Florinef 0.1 mg q.d. will be added to his medical regime.  Salt will be liberalized his diet.  I discussed at length the importance of assuming a sitting position whenever he feels weak or dizzy and not walking until he is improved.  He recalls instructions to only take nitroglycerin if sitting or lying.  Laboratory studies will be checked including a metabolic profile, CBC, lipid profile and TSH level.  He will followup with the nurses in 2 weeks and with me in one month.  Problem # 3:  RENAL INSUFFICIENCY (ICD-588.9) BUN and creatinine will be reassessed.  Problem # 4:  ANEMIA (ICD-285.9) CBC will be reassessed.  His hemoglobin has normalized, and iron supplement can be discontinued.  Problem # 5:  HYPERTENSION (ICD-401.9) Systolic blood pressure is on the high side, but probably needs to be even a bit higher.  We will attempt to avoid significant supine hypertension while treating his orthostatic hypotension.  Other Orders: 2-D Echocardiogram (2D Echo) Future Orders: T-CBC w/Diff ([id number]) ... 08/22/2010 T-Lipid Profile [phone]) ... 08/22/2010 T-TSH [id number]) ... 08/22/2010 T-Comprehensive Metabolic Panel [id number]) ... 08/22/2010  Patient Instructions: 1)  Your physician recommends that you schedule a follow-up appointment in: 1 month 2)  Your physician recommends that you return for lab work in: next week 3)  Your physician has recommended you make the following change in your medication: Florinef 0.1mg  daily 4)  You have been referred to a nurse visit in 2 weeks.  Please bring BP and weight diary to visit. 5)  Your physician has requested that you have an echocardiogram.  Echocardiography is a painless test that uses sound waves to create images of your heart. It provides your doctor with information about the size and shape of your heart and how well  your heart's chambers and valves are working.  This procedure takes approximately one hour. There are no restrictions for this procedure. 6)  Your physician has requested that you regularly monitor and record your blood pressure readings at home.  Please use the same machine at the same time of day to check your readings and record them to bring to your follow-up visit. 7)  Your physician recommends that you weigh, daily, at the same time every day, and in the same amount of clothing.  Please record your daily weights on the handout provided and bring it to your next appointment. CALL FOR INCREASE IN WEIGHT OF GREATER THAN 5 POUNDS. 8)  NO SALT RESTRICTIONS IN YOUR DIET Prescriptions: FLUDROCORTISONE ACETATE 0.1 MG TABS (FLUDROCORTISONE ACETATE) Take 1 tablet by mouth once a day  #30 x 3   Entered by:   Fuller Plan CMA   Authorized by:   Kathlen Brunswick, MD, [facility]  Signed by:   Fuller Plan CMA on 08/18/2010   Method used:   Electronically to        [facility] (retail)       [address]       [place], [place]  [address]       Ph: [phone]       Fax: [phone]   RxID:   [id number]

## 2010-09-27 NOTE — Assessment & Plan Note (Signed)
Summary: F1M   Visit Type:  Follow-up Primary Provider:  Dr. Karleen Hampshire   History of Present Illness: [name] returns to the office for continued assessment and treatment of coronary disease, hypertension and orthostatic hypotension.  Since his last visit one month ago, he has done quite well.  He denies chest discomfort, dyspnea, syncope or falls.  He has had some minor orthostatic lightheadedness, but is generally satisfied with his current functional status and symptomatology.     Current Medications (verified): 1)  Pravastatin Sodium 40 Mg Tabs (Pravastatin Sodium) .... Take One Tablet By Mouth Daily At Bedtime 2)  Plavix 75 Mg Tabs (Clopidogrel Bisulfate) .... Take 1 Tab Daily 3)  Vitamin B-12 100 Mcg Tabs (Cyanocobalamin) .... Take 1 Tab Daily 4)  Nitrostat 0.4 Mg Subl (Nitroglycerin) .... Take As Needed For Chest Pain 5)  Allopurinol 300 Mg Tabs (Allopurinol) .... Take 1/2 Tab Daily 6)  Ambien 10 Mg Tabs (Zolpidem Tartrate) .... Take 1 Tab At Bedtime 7)  Centrum Silver  Tabs (Multiple Vitamins-Minerals) .... Take 1 Tablet By Mouth Once A Day 8)  Fludrocortisone Acetate 0.1 Mg Tabs (Fludrocortisone Acetate) .... Take 1 Tablet By Mouth Once A Day  Allergies (verified): 1)  ! Aspirin  Comments:  Nurse/Medical Assistant: patient brought meds reviewded with patient and last ov pharmacy is Estate agent  Past History:  PMH, FH, and Social History reviewed and updated.  Review of Systems       See history of present illness.  Vital Signs:  Patient profile:   77 year old male Weight:      193 pounds BMI:     26.27 Pulse rate:   79 / minute Pulse (ortho):   79 / minute BP sitting:   146 / 82  (left arm) BP standing:   130 / 75  Vitals Entered By: Dreama Saa, CNA (September 18, 2010 12:56 PM)  Serial Vital Signs/Assessments:  Time      Position  BP       Pulse  Resp  Temp     By 1:46 PM   Lying LA  159/94   75                    Tammy Sanders  RN 1:46 PM   Sitting   151/94   79                    Tammy Sanders RN 1:46 PM   Standing  130/75   79                    Tammy Sanders RN  Comments: 1:46 PM lightheaded lying and sitting, no c/o upon standing By: Teressa Lower RN    Physical Exam  General:  Proportionate weight and height; well developed; no acute distress:   Neck-No JVD; no carotid bruits: Lungs-No tachypnea, no rales; no rhonchi; no wheezes: Cardiovascular-normal PMI; normal S1 and S2; modest systolic murmur Abdomen-BS normal; soft and non-tender without masses or organomegaly:  Musculoskeletal-No deformities, no cyanosis or clubbing: Neurologic-Normal cranial nerves; symmetric strength and tone:  Skin-Warm, no significant lesions: Extremities-Distal pulses intact; no edema:     Impression & Recommendations:  Problem # 1:  ATHEROSCLEROTIC CARDIOVASCULAR DISEASE (ICD-429.2) Patient continues to do well following complex multivessel percutaneous intervention.  We will attempt to optimally control risk factors in order to maximize the likelihood that this state of affairs will continue.  Problem # 2:  ORTHOSTATIC  HYPOTENSION (ICD-458.0) Blood pressure values are improved with low dose Florinef.  Symptoms persist, but are mild and tolerable.  Patient is advised to call the office or seek immediate medical attention should he develop chest discomfort, dyspnea or experienced severe dizziness or syncope.  Problem # 3:  HYPERLIPIDEMIA (ICD-272.4) Recent lipid profile is generally good except for a moderately elevated level of triglycerides, which prevents accurate calculation of LDL cholesterol.  Patient's dose of statin will be doubled and a lipid profile as well as a direct measurement of LDL repeated in one month.  CHOL: 151 (08/22/2010)   LDL: See Comment mg/dL (81/19/1478)   HDL: 32 (08/22/2010)   TG: 411 (08/22/2010)  Problem # 4:  HYPERTENSION (ICD-401.9) Systolic blood pressures are mildly elevated, but  tighter control cannot be achieved, as this would create symptomatic orthostasis.  Patient is currently taking no antihypertensive medications and does not appear to require drugs from that class.  I will plan to reassess this nice gentleman in 3 months.  If he experiences any problems in the interim, he is encouraged to call.  Other Orders: Future Orders: T-Lipid Profile ([id number]) ... 10/18/2010 T-Basic Metabolic Panel [id number]) ... 10/18/2010 T- * Misc. Laboratory test [id number]) ... 10/18/2010  Patient Instructions: 1)  Your physician recommends that you schedule a follow-up appointment in: 3 months 2)  Your physician recommends that you return for lab work in:1 month 3)  Your physician has recommended you make the following change in your medication: increase pravastatin to 40mg  daily Prescriptions: PRAVASTATIN SODIUM 40 MG TABS (PRAVASTATIN SODIUM) Take one tablet by mouth daily at bedtime  #30 x 3   Entered by:   Teressa Lower RN   Authorized by:   Kathlen Brunswick, MD, [facility]   Signed by:   Teressa Lower RN on 09/18/2010   Method used:   Electronically to        [facility] (retail)       [address]       [place], [place]  [address]       Ph: [phone]       Fax: [phone]   RxID:   [id number]

## 2010-09-27 NOTE — Letter (Signed)
Summary: [facility], [facility] at [employer]  [address] [place] [address]   Phone: [phone]  Fax: [phone]     September 18, 2010 MRN: [id number]   Lee Ramirez [address] [place], [place]  [address]      YOUR LAB WORK IS DUE   October 18, 2010  Please go to [facility], located across the street from [facility] on the second floor.  Hours are Monday - Friday 7am until 7:30pm         Saturday 8am until 12noon    _X_  DO NOT EAT OR DRINK AFTER MIDNIGHT EVENING PRIOR TO LABWORK

## 2010-10-18 ENCOUNTER — Encounter: Payer: Self-pay | Admitting: Cardiology

## 2010-10-18 LAB — CONVERTED CEMR LAB
BUN: 19 mg/dL (ref 6–23)
Calcium: 9.8 mg/dL (ref 8.4–10.5)
Cholesterol: 169 mg/dL (ref 0–200)
Creatinine, Ser: 1.27 mg/dL (ref 0.40–1.50)
Glucose, Bld: 109 mg/dL — ABNORMAL HIGH (ref 70–99)
Triglycerides: 256 mg/dL — ABNORMAL HIGH (ref <150)
VLDL: 51 mg/dL — ABNORMAL HIGH (ref 0–40)

## 2010-10-31 LAB — BASIC METABOLIC PANEL
CO2: 29 mEq/L (ref 19–32)
Calcium: 9.6 mg/dL (ref 8.4–10.5)
Chloride: 106 mEq/L (ref 96–112)
GFR calc Af Amer: >60 mL/min (ref >60)
Glucose, Bld: 98 mg/dL (ref 70–99)
Potassium: 4.1 mEq/L (ref 3.5–5.1)
Sodium: 144 mEq/L (ref 135–145)

## 2010-10-31 LAB — HEMOGLOBIN AND HEMATOCRIT, BLOOD: HCT: 33.4 % — ABNORMAL LOW (ref 39.0–52.0)

## 2010-11-09 LAB — BASIC METABOLIC PANEL
BUN: 15 mg/dL (ref 6–23)
BUN: 18 mg/dL (ref 6–23)
BUN: 22 mg/dL (ref 6–23)
BUN: 22 mg/dL (ref 6–23)
CO2: 25 mEq/L (ref 19–32)
CO2: 26 mEq/L (ref 19–32)
CO2: 26 mEq/L (ref 19–32)
CO2: 27 mEq/L (ref 19–32)
CO2: 27 mEq/L (ref 19–32)
Calcium: 8.6 mg/dL (ref 8.4–10.5)
Calcium: 8.7 mg/dL (ref 8.4–10.5)
Calcium: 8.7 mg/dL (ref 8.4–10.5)
Calcium: 8.9 mg/dL (ref 8.4–10.5)
Calcium: 8.9 mg/dL (ref 8.4–10.5)
Calcium: 8.9 mg/dL (ref 8.4–10.5)
Chloride: 102 mEq/L (ref 96–112)
Chloride: 105 mEq/L (ref 96–112)
Chloride: 107 mEq/L (ref 96–112)
Chloride: 108 mEq/L (ref 96–112)
Creatinine, Ser: 1.09 mg/dL (ref 0.4–1.5)
Creatinine, Ser: 1.24 mg/dL (ref 0.4–1.5)
Creatinine, Ser: 1.32 mg/dL (ref 0.4–1.5)
Creatinine, Ser: 1.51 mg/dL — ABNORMAL HIGH (ref 0.4–1.5)
Creatinine, Ser: 1.66 mg/dL — ABNORMAL HIGH (ref 0.4–1.5)
GFR calc Af Amer: 48 mL/min — ABNORMAL LOW (ref >60)
GFR calc Af Amer: >60 mL/min (ref >60)
GFR calc Af Amer: >60 mL/min (ref >60)
GFR calc Af Amer: >60 mL/min (ref >60)
GFR calc non Af Amer: 44 mL/min — ABNORMAL LOW (ref >60)
GFR calc non Af Amer: 48 mL/min — ABNORMAL LOW (ref >60)
GFR calc non Af Amer: 53 mL/min — ABNORMAL LOW (ref >60)
GFR calc non Af Amer: 55 mL/min — ABNORMAL LOW (ref >60)
GFR calc non Af Amer: >60 mL/min (ref >60)
Glucose, Bld: 111 mg/dL — ABNORMAL HIGH (ref 70–99)
Glucose, Bld: 111 mg/dL — ABNORMAL HIGH (ref 70–99)
Glucose, Bld: 114 mg/dL — ABNORMAL HIGH (ref 70–99)
Glucose, Bld: 117 mg/dL — ABNORMAL HIGH (ref 70–99)
Glucose, Bld: 119 mg/dL — ABNORMAL HIGH (ref 70–99)
Glucose, Bld: 119 mg/dL — ABNORMAL HIGH (ref 70–99)
Glucose, Bld: 123 mg/dL — ABNORMAL HIGH (ref 70–99)
Potassium: 3.8 mEq/L (ref 3.5–5.1)
Potassium: 3.9 mEq/L (ref 3.5–5.1)
Potassium: 4.1 mEq/L (ref 3.5–5.1)
Potassium: 4.9 mEq/L (ref 3.5–5.1)
Sodium: 136 mEq/L (ref 135–145)
Sodium: 137 mEq/L (ref 135–145)
Sodium: 138 mEq/L (ref 135–145)
Sodium: 140 mEq/L (ref 135–145)
Sodium: 140 mEq/L (ref 135–145)
Sodium: 141 mEq/L (ref 135–145)
Sodium: 141 mEq/L (ref 135–145)

## 2010-11-09 LAB — CARDIAC PANEL(CRET KIN+CKTOT+MB+TROPI)
CK, MB: 2.1 ng/mL (ref 0.3–4.0)
CK, MB: 39.4 ng/mL (ref 0.3–4.0)
CK, MB: 64 ng/mL (ref 0.3–4.0)
Relative Index: 12.1 — ABNORMAL HIGH (ref 0.0–2.5)
Relative Index: 13.6 — ABNORMAL HIGH (ref 0.0–2.5)
Relative Index: 15.5 — ABNORMAL HIGH (ref 0.0–2.5)
Total CK: 289 U/L — ABNORMAL HIGH (ref 7–232)
Total CK: 529 U/L — ABNORMAL HIGH (ref 7–232)
Total CK: 91 U/L (ref 7–232)
Troponin I: 1.93 ng/mL (ref 0.00–0.06)
Troponin I: 10.99 ng/mL (ref 0.00–0.06)
Troponin I: 3.57 ng/mL (ref 0.00–0.06)

## 2010-11-09 LAB — GLUCOSE, CAPILLARY: Glucose-Capillary: 112 mg/dL — ABNORMAL HIGH (ref 70–99)

## 2010-11-09 LAB — FOLATE: Folate: >20 ng/mL

## 2010-11-09 LAB — CBC
HCT: 25.8 % — ABNORMAL LOW (ref 39.0–52.0)
HCT: 28.9 % — ABNORMAL LOW (ref 39.0–52.0)
HCT: 31.3 % — ABNORMAL LOW (ref 39.0–52.0)
HCT: 33 % — ABNORMAL LOW (ref 39.0–52.0)
Hemoglobin: 10.1 g/dL — ABNORMAL LOW (ref 13.0–17.0)
Hemoglobin: 10.9 g/dL — ABNORMAL LOW (ref 13.0–17.0)
Hemoglobin: 11.3 g/dL — ABNORMAL LOW (ref 13.0–17.0)
Hemoglobin: 11.7 g/dL — ABNORMAL LOW (ref 13.0–17.0)
Hemoglobin: 8.5 g/dL — ABNORMAL LOW (ref 13.0–17.0)
Hemoglobin: 8.8 g/dL — ABNORMAL LOW (ref 13.0–17.0)
Hemoglobin: 9.7 g/dL — ABNORMAL LOW (ref 13.0–17.0)
Hemoglobin: 9.9 g/dL — ABNORMAL LOW (ref 13.0–17.0)
MCHC: 34 g/dL (ref 30.0–36.0)
MCHC: 34.2 g/dL (ref 30.0–36.0)
MCHC: 34.5 g/dL (ref 30.0–36.0)
MCHC: 34.8 g/dL (ref 30.0–36.0)
MCHC: 34.9 g/dL (ref 30.0–36.0)
MCHC: 35 g/dL (ref 30.0–36.0)
MCHC: 35 g/dL (ref 30.0–36.0)
MCV: 103.3 fL — ABNORMAL HIGH (ref 78.0–100.0)
MCV: 103.4 fL — ABNORMAL HIGH (ref 78.0–100.0)
MCV: 103.7 fL — ABNORMAL HIGH (ref 78.0–100.0)
MCV: 103.8 fL — ABNORMAL HIGH (ref 78.0–100.0)
MCV: 103.9 fL — ABNORMAL HIGH (ref 78.0–100.0)
MCV: 104 fL — ABNORMAL HIGH (ref 78.0–100.0)
MCV: 104.6 fL — ABNORMAL HIGH (ref 78.0–100.0)
Platelets: 116 10*3/uL — ABNORMAL LOW (ref 150–400)
Platelets: 57 10*3/uL — ABNORMAL LOW (ref 150–400)
Platelets: 78 10*3/uL — ABNORMAL LOW (ref 150–400)
Platelets: 79 10*3/uL — ABNORMAL LOW (ref 150–400)
Platelets: 81 10*3/uL — ABNORMAL LOW (ref 150–400)
Platelets: 89 10*3/uL — ABNORMAL LOW (ref 150–400)
Platelets: 89 10*3/uL — ABNORMAL LOW (ref 150–400)
RBC: 2.46 MIL/uL — ABNORMAL LOW (ref 4.22–5.81)
RBC: 2.74 MIL/uL — ABNORMAL LOW (ref 4.22–5.81)
RBC: 2.78 MIL/uL — ABNORMAL LOW (ref 4.22–5.81)
RBC: 3.01 MIL/uL — ABNORMAL LOW (ref 4.22–5.81)
RBC: 3.16 MIL/uL — ABNORMAL LOW (ref 4.22–5.81)
RBC: 3.31 MIL/uL — ABNORMAL LOW (ref 4.22–5.81)
RDW: 14.2 % (ref 11.5–15.5)
RDW: 14.3 % (ref 11.5–15.5)
RDW: 14.3 % (ref 11.5–15.5)
RDW: 14.4 % (ref 11.5–15.5)
RDW: 14.4 % (ref 11.5–15.5)
RDW: 14.4 % (ref 11.5–15.5)
RDW: 14.5 % (ref 11.5–15.5)
RDW: 14.5 % (ref 11.5–15.5)
WBC: 5.9 10*3/uL (ref 4.0–10.5)
WBC: 6.1 10*3/uL (ref 4.0–10.5)
WBC: 7.3 10*3/uL (ref 4.0–10.5)
WBC: 7.6 10*3/uL (ref 4.0–10.5)
WBC: 8.6 10*3/uL (ref 4.0–10.5)

## 2010-11-09 LAB — DIFFERENTIAL
Basophils Absolute: 0 10*3/uL (ref 0.0–0.1)
Basophils Absolute: 0 10*3/uL (ref 0.0–0.1)
Basophils Absolute: 0 10*3/uL (ref 0.0–0.1)
Basophils Absolute: 0.1 10*3/uL (ref 0.0–0.1)
Basophils Absolute: 0.1 10*3/uL (ref 0.0–0.1)
Basophils Relative: 0 % (ref 0–1)
Basophils Relative: 0 % (ref 0–1)
Basophils Relative: 0 % (ref 0–1)
Basophils Relative: 0 % (ref 0–1)
Basophils Relative: 1 % (ref 0–1)
Eosinophils Absolute: 0.1 10*3/uL (ref 0.0–0.7)
Eosinophils Absolute: 0.2 10*3/uL (ref 0.0–0.7)
Eosinophils Absolute: 0.2 10*3/uL (ref 0.0–0.7)
Eosinophils Absolute: 0.2 10*3/uL (ref 0.0–0.7)
Eosinophils Absolute: 0.2 10*3/uL (ref 0.0–0.7)
Eosinophils Relative: 1 % (ref 0–5)
Eosinophils Relative: 3 % (ref 0–5)
Eosinophils Relative: 4 % (ref 0–5)
Eosinophils Relative: 4 % (ref 0–5)
Lymphocytes Relative: 28 % (ref 12–46)
Lymphocytes Relative: 30 % (ref 12–46)
Lymphocytes Relative: 36 % (ref 12–46)
Lymphocytes Relative: 46 % (ref 12–46)
Lymphs Abs: 1.5 10*3/uL (ref 0.7–4.0)
Lymphs Abs: 2.1 10*3/uL (ref 0.7–4.0)
Monocytes Absolute: 0.5 10*3/uL (ref 0.1–1.0)
Monocytes Absolute: 0.5 10*3/uL (ref 0.1–1.0)
Monocytes Absolute: 0.5 10*3/uL (ref 0.1–1.0)
Monocytes Absolute: 0.6 10*3/uL (ref 0.1–1.0)
Monocytes Relative: 12 % (ref 3–12)
Monocytes Relative: 8 % (ref 3–12)
Monocytes Relative: 9 % (ref 3–12)
Neutro Abs: 2.6 10*3/uL (ref 1.7–7.7)
Neutro Abs: 3.3 10*3/uL (ref 1.7–7.7)
Neutro Abs: 3.4 10*3/uL (ref 1.7–7.7)
Neutro Abs: 3.6 10*3/uL (ref 1.7–7.7)
Neutro Abs: 3.7 10*3/uL (ref 1.7–7.7)
Neutro Abs: 4.1 10*3/uL (ref 1.7–7.7)
Neutrophils Relative %: 43 % (ref 43–77)
Neutrophils Relative %: 49 % (ref 43–77)
Neutrophils Relative %: 51 % (ref 43–77)
Neutrophils Relative %: 60 % (ref 43–77)
Neutrophils Relative %: 64 % (ref 43–77)

## 2010-11-09 LAB — HEPARIN LEVEL (UNFRACTIONATED)
Heparin Unfractionated: 0.28 IU/mL — ABNORMAL LOW (ref 0.30–0.70)
Heparin Unfractionated: <0.1 IU/mL — ABNORMAL LOW (ref 0.30–0.70)

## 2010-11-09 LAB — COMPREHENSIVE METABOLIC PANEL
BUN: 18 mg/dL (ref 6–23)
Calcium: 9.1 mg/dL (ref 8.4–10.5)
Glucose, Bld: 118 mg/dL — ABNORMAL HIGH (ref 70–99)
Total Protein: 6.4 g/dL (ref 6.0–8.3)

## 2010-11-09 LAB — PROTIME-INR
INR: 1.01 (ref 0.00–1.49)
Prothrombin Time: 13.2 seconds (ref 11.6–15.2)

## 2010-11-09 LAB — HEPARIN INDUCED THROMBOCYTOPENIA PNL
Heparin Induced Plt Ab: NEGATIVE
Patient O.D.: 0.165

## 2010-11-09 LAB — RETICULOCYTES: Retic Ct Pct: 1.2 % (ref 0.4–3.1)

## 2010-11-09 LAB — POCT CARDIAC MARKERS: Myoglobin, poc: 482 ng/mL (ref 12–200)

## 2010-11-09 LAB — VITAMIN B12
Vitamin B-12: 252 pg/mL (ref 211–911)
Vitamin B-12: 263 pg/mL (ref 211–911)

## 2010-11-09 LAB — TSH: TSH: 2.318 u[IU]/mL (ref 0.350–4.500)

## 2010-11-09 LAB — FOLATE RBC: RBC Folate: 763 ng/mL — ABNORMAL HIGH (ref 180–600)

## 2010-11-09 LAB — MRSA PCR SCREENING: MRSA by PCR: NEGATIVE

## 2010-11-09 LAB — LIPID PANEL
Cholesterol: 151 mg/dL (ref 0–200)
HDL: 30 mg/dL — ABNORMAL LOW (ref >39)
Triglycerides: 190 mg/dL — ABNORMAL HIGH (ref <150)

## 2010-11-09 LAB — TECHNOLOGIST SMEAR REVIEW

## 2010-11-09 LAB — FERRITIN: Ferritin: 166 ng/mL (ref 22–322)

## 2010-12-01 ENCOUNTER — Other Ambulatory Visit: Payer: Self-pay | Admitting: Cardiology

## 2010-12-05 ENCOUNTER — Ambulatory Visit: Payer: Self-pay | Admitting: Cardiology

## 2010-12-05 ENCOUNTER — Encounter: Payer: Self-pay | Admitting: Cardiology

## 2010-12-21 ENCOUNTER — Ambulatory Visit (INDEPENDENT_AMBULATORY_CARE_PROVIDER_SITE_OTHER): Payer: Medicare Other | Admitting: Cardiology

## 2010-12-21 ENCOUNTER — Encounter: Payer: Self-pay | Admitting: Cardiology

## 2010-12-21 DIAGNOSIS — I447 Left bundle-branch block, unspecified: Secondary | ICD-10-CM

## 2010-12-21 DIAGNOSIS — D6959 Other secondary thrombocytopenia: Secondary | ICD-10-CM

## 2010-12-21 DIAGNOSIS — I951 Orthostatic hypotension: Secondary | ICD-10-CM | POA: Insufficient documentation

## 2010-12-21 DIAGNOSIS — I2510 Atherosclerotic heart disease of native coronary artery without angina pectoris: Secondary | ICD-10-CM

## 2010-12-21 DIAGNOSIS — I10 Essential (primary) hypertension: Secondary | ICD-10-CM

## 2010-12-21 DIAGNOSIS — D539 Nutritional anemia, unspecified: Secondary | ICD-10-CM | POA: Insufficient documentation

## 2010-12-21 DIAGNOSIS — C61 Malignant neoplasm of prostate: Secondary | ICD-10-CM

## 2010-12-21 DIAGNOSIS — D649 Anemia, unspecified: Secondary | ICD-10-CM

## 2010-12-21 DIAGNOSIS — E785 Hyperlipidemia, unspecified: Secondary | ICD-10-CM

## 2010-12-21 DIAGNOSIS — D696 Thrombocytopenia, unspecified: Secondary | ICD-10-CM | POA: Insufficient documentation

## 2010-12-21 NOTE — Assessment & Plan Note (Signed)
Patient has good exercise tolerance and no symptoms to suggest myocardial ischemia.  It appears that his complex percutaneous procedure was quite successful.  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

## 2010-12-21 NOTE — Assessment & Plan Note (Signed)
Patient is essentially asymptomatic with respect to this problem, which appears very well controlled with a minimal dose of medication.  Since he is experiencing no adverse effects, fludrocortisone will be continued.  A recent chemistry profile shows normal serum sodium and potassium as well as normal BUN and creatinine.

## 2010-12-21 NOTE — Progress Notes (Signed)
HPI : [name] returns to the office as scheduled for continued assessment and treatment of coronary disease, cardiovascular risk factors and orthostatic hypotension.  Since his last visit, he has done quite well.  He remains active for his age without any dyspnea, chest discomfort or lightheadedness.  He does experience dizziness when he bends over for a prolonged period of time, but has had no falls and no apparent loss of consciousness.  Current Outpatient Prescriptions on File Prior to Visit  Medication Sig Dispense Refill  . allopurinol (ZYLOPRIM) 300 MG tablet Take 300 mg by mouth daily.        . Multiple Vitamins-Minerals (MULTIVITAMIN WITH MINERALS) tablet Take 1 tablet by mouth daily.        . nitroGLYCERIN (NITROSTAT) 0.4 MG SL tablet Place 0.4 mg under the tongue every 5 (five) minutes as needed.        Marland Kitchen PLAVIX 75 MG tablet TAKE 1 TABLET BY MOUTH   ONCE A DAY WITH A MEAL.  30 each  1  . pravastatin (PRAVACHOL) 40 MG tablet Take 40 mg by mouth daily.        Marland Kitchen zolpidem (AMBIEN) 10 MG tablet Take 10 mg by mouth at bedtime as needed.        Marland Kitchen DISCONTD: cyanocobalamin 100 MCG tablet Take 100 mcg by mouth daily.        Marland Kitchen DISCONTD: fludrocortisone (FLORINEF) 0.1 MG tablet Take 0.1 mg by mouth daily.           Allergies  Allergen Reactions  . Aspirin       Past medical history, social history, and family history reviewed and updated.  ROS: See history of present illness.  PHYSICAL EXAM: BP 125/72  Pulse 108  Ht 6\' 1"  (1.854 m)  Wt 190 lb (86.183 kg)  BMI 25.07 kg/m2  SpO2 95% ; no significant orthostatic change in blood pressure General-Well developed; no acute distress; impaired hearing Body habitus-proportionate weight and height Neck-No JVD; no carotid bruits Lungs-clear lung fields; resonant to percussion Cardiovascular-normal PMI; normal S1; increased intensity of S2; modest early systolic ejection murmur Abdomen-normal bowel sounds; soft and non-tender without masses  or organomegaly Musculoskeletal-No deformities, no cyanosis or clubbing Neurologic-Normal cranial nerves; symmetric strength and tone Skin-Warm, no significant lesions Extremities-distal pulses intact; trace edema  EKG (Rhythm Strip): Sinus tachycardia with first degree AV block and a heart rate of 104 bpm; IVCD  ASSESSMENT AND PLAN:

## 2010-12-21 NOTE — Patient Instructions (Signed)
Your physician recommends that you schedule a follow-up appointment in: 9 months Stool cards- follow instructions

## 2010-12-21 NOTE — Assessment & Plan Note (Signed)
Blood pressure control is good despite treatment with fludrocortisone for orthostatic hypotension.

## 2010-12-21 NOTE — Assessment & Plan Note (Addendum)
Lipids improved dramatically with an increase from 20 to 40 mg of pravastatin, probably more than would be expected from the pharmacologic effect of this agent.  Nonetheless, his most recent profile is good, and current therapy will be continued.

## 2010-12-25 ENCOUNTER — Encounter: Payer: Self-pay | Admitting: Cardiology

## 2010-12-29 ENCOUNTER — Other Ambulatory Visit: Payer: Self-pay | Admitting: *Deleted

## 2010-12-29 MED ORDER — NITROGLYCERIN 0.4 MG SL SUBL: 0.4000 mg | SUBLINGUAL_TABLET | SUBLINGUAL | Status: DC | PRN

## 2011-01-02 NOTE — Consult Note (Signed)
NAME:  JAIMES, ECKERT              ACCOUNT NO.:  [id number]   MEDICAL RECORD NO.:  [id number]          PATIENT TYPE:  AMB   LOCATION:  DAY                           FACILITY:  [facility]   PHYSICIAN:  Kassie Mends, M.D.      DATE OF BIRTH:  11-23-1921   DATE OF CONSULTATION:  DATE OF DISCHARGE:                                 CONSULTATION   REFERRING PHYSICIAN:  [name], M.D.   REASON FOR CONSULTATION:  Rectal bleeding.   HISTORY OF PRESENT ILLNESS:  Mr. Gerke is an 77 year old male who had  prostate cancer 5 years ago treated with radiation.  He has never had a  colonoscopy.  He has been having intermittent blood in his stools since  November of 2009.  He does complain of some abdominal pain across the  top of his abdomen.  He also complains of a change in his bowel habits.  He had loose bowels all his life but now he has problems with  constipation.  He describes this constipation as his bowels become  locked up and he has hard stools like a brick.  He denies any nausea,  vomiting, or problems swallowing.  He has heartburn 2 to 3 times a week  and eats Tums.  His appetite has been good.  He denies any weight  loss.  He has had some mild rectal discomfort after bowel movements.  He  initially thought it was hemorrhoids.  He was seen by Dr. Regino Schultze and  given suppositories.   PAST MEDICAL HISTORY:  Per the HPI.   PAST SURGICAL HISTORY:  1. Bilateral hernia repair.  2. Appendectomy in the 1940s.   ALLERGIES:  ASPIRIN causes his lips and tongue to swell.   MEDICATIONS:  None.   FAMILY HISTORY:  Denies any family history of colon cancer or colon  polyps, ulcerative colitis, or Crohn's disease.   SOCIAL HISTORY:  He has been married for 63 years.  He is retired from  [employer].  He used to make [employer].  He denies any tobacco or  alcohol use.   REVIEW OF SYSTEMS:  As per the HPI; otherwise, all systems are negative.   PHYSICAL EXAMINATION:  VITAL SIGNS:   Weight 195 pounds, height 6 feet 2  inches, BMI 25.  Temperature 97.4, blood pressure 140/80, pulse  72.GENERAL:  He is in no apparent distress, alert and oriented x4.HEENT:  Atraumatic and normocephalic.  Pupils equal and reactive to light.  Mouth:  No oral lesions.  Posterior pharynx:  Without erythema or  exudates.  NECK:  Full range of motion.  No lymphadenopathy.LUNGS:  Clear to auscultation bilaterally.CARDIOVASCULAR:  Regular rhythm,  unable to appreciate a murmur.  Normal S1 and S2.ABDOMEN:  Bowel sounds  are present, soft, nontender, nondistended, no rebound or guarding, no  hepatosplenomegaly, no abdominal bruits.  EXTREMITIES:  No cyanosis or edema. NEUROLOGIC:  He has no focal  neurologic deficits.   ASSESSMENT:  Mr. Ripple is an 77 year old male who presents with  intermittent rectal bleeding associated with mild abdominal pain and  rectal discomfort.  The differential  diagnosis includes colorectal polyp  or cancer, radiation proctitis, inflammatory bowel disease.  Should also  consider ischemic colitis. Thank you for allowing me to see Mr. Swiatek  in consultation.  My recommendations follow.   RECOMMENDATIONS:  1. We will perform colonoscopy with probable APC on Thursday, December      the 10th.  He will have a HalfLytely bowel prep.  Could consider      EGD if no source for his rectal bleeding has been identified.  2. We will add Prilosec once daily after his colonoscopy is complete.  3. We will schedule his follow-up appointment after the results of his      endoscopy is known.      Kassie Mends, M.D.  Electronically Signed     SM/MEDQ  D:  07/27/2008  T:  07/27/2008  Job:  [id number]   cc:   [name], M.D.  Fax: [phone]

## 2011-01-02 NOTE — Op Note (Signed)
NAME:  Lee Ramirez, Lee Ramirez              ACCOUNT NO.:  [id number]   MEDICAL RECORD NO.:  [id number]          PATIENT TYPE:  AMB   LOCATION:  DAY                           FACILITY:  [facility]   PHYSICIAN:  Kassie Mends, M.D.      DATE OF BIRTH:  November 19, 1921   DATE OF PROCEDURE:  07/29/2008  DATE OF DISCHARGE:                               OPERATIVE REPORT   REFERRING Lurae Hornbrook:  [name], MD   PROCEDURE:  Ileocolonoscopy with ablation of radiation proctitis via  BICAP.   INDICATION FOR EXAM:  Mr. Antonini is an 77 year old male who presents  with intermittent rectal bleeding.  He was given suppositories by Dr.  Regino Schultze, and has a significant past medical history of prostate cancer  requiring radiation 5 years ago.   FINDINGS:  1. Multiple areas of hypervascularity seen in the rectum.  The areas      were ablated using a 7-French BICAP probe (25 watts).  Moderate      internal hemorrhoids.  2. Frequent sigmoid colon diverticula.  Otherwise, no polyps, masses,      inflammatory changes, or AVMs.  3. Normal distal terminal ileum, approximately 5 cm visualized.   DIAGNOSES:  1. Radiation proctitis, ablated.  2. Moderate internal hemorrhoids.   RECOMMENDATIONS:  1. No aspirin, NSAIDs, or anticoagulation for 7 days.  2. He should follow a high-fiber diet.  He was given a handout on high-      fiber diet, diverticulosis, and hemorrhoids.   MEDICATIONS:  1. Demerol 50 mg IV.  2. Versed 4 mg IV.   PROCEDURE TECHNIQUE:  Physical exam was performed.  Informed consent was  obtained from the patient after explaining the benefits, risks, and  alternatives to the procedure.  The patient was connected to the monitor  and placed in the left lateral position.  Continuous oxygen was provided  by nasal cannula and IV medicine administered through an indwelling  cannula.  After administration of sedation and rectal exam, the  patient's rectum was intubated  and the scope was advanced under  direct visualization to the distal  terminal ileum.  The scope was removed slowly by carefully examining the  color, texture, anatomy, and integrity of the mucosa on the way out.  The patient was recovered in endoscopy and discharged to home in  satisfactory condition.      Kassie Mends, M.D.  Electronically Signed     SM/MEDQ  D:  07/29/2008  T:  07/30/2008  Job:  [id number]   cc:   [name], M.D.  Fax: [phone]

## 2011-01-26 ENCOUNTER — Other Ambulatory Visit: Payer: Self-pay | Admitting: Cardiology

## 2011-03-22 ENCOUNTER — Other Ambulatory Visit: Payer: Self-pay | Admitting: Cardiology

## 2011-06-12 ENCOUNTER — Encounter: Payer: Self-pay | Admitting: Adult Health

## 2011-06-12 ENCOUNTER — Ambulatory Visit (INDEPENDENT_AMBULATORY_CARE_PROVIDER_SITE_OTHER): Payer: Medicare Other | Admitting: Adult Health

## 2011-06-12 VITALS — BP 89/63 | HR 89 | Ht 73.0 in | Wt 187.0 lb

## 2011-06-12 DIAGNOSIS — I2510 Atherosclerotic heart disease of native coronary artery without angina pectoris: Secondary | ICD-10-CM

## 2011-06-12 DIAGNOSIS — D649 Anemia, unspecified: Secondary | ICD-10-CM

## 2011-06-12 DIAGNOSIS — I951 Orthostatic hypotension: Secondary | ICD-10-CM

## 2011-06-12 MED ORDER — ZOLPIDEM TARTRATE 5 MG PO TABS: 5.0000 mg | ORAL_TABLET | Freq: Every evening | ORAL | Status: DC | PRN

## 2011-06-12 NOTE — Assessment & Plan Note (Signed)
He is asymptomatic with chest discomfort but continues DOE.  This may be related to the ischemic systolic dysfx. He is advised to take his time and not to over exert himself if he becomes tired.  Will repeat ECHO soon for re-evaluation of his LV fx.

## 2011-06-12 NOTE — Patient Instructions (Signed)
Your physician has recommended you make the following change in your medication: decrease Ambien to 5 mg at bedtime as needed  Your physician recommends that you return for lab work in: when you have labs drawn at Dr. Edison Simon office  Your physician recommends that you schedule a follow-up appointment in: 1 month

## 2011-06-12 NOTE — Assessment & Plan Note (Signed)
These BP are checked in the office today. He remains orthostatic. He is not on any medications that will affect BP with the exception of the allopurinol. Will need to decrease this does if CBC is not indicative of significant anemia.  Consider TED hose on next visit as well.  His dizziness may be part of the orthostasis, but also from the [place]. I have asked him to decrease the dose to 5 mg HS to avoid the next day sluggishness for someone his age.  He will follow-up with Dr. Dietrich Pates.

## 2011-06-12 NOTE — Assessment & Plan Note (Signed)
I have reviewed most recent labs that were drawn in July of 2012.  Hgb:11.5 Hct: 34.1.  Platlets 110. MCV 103.6, MCH 35.0 RBC's 3.29.  I will have another CBC done. He denies bleeding. I have given him hemoccult cards to return to [place].

## 2011-06-12 NOTE — Progress Notes (Signed)
HPI: Lee Ramirez is a very pleasant 77 y/o patient of Dr. Dietrich Pates we are seeing for ongoing assessment and treatment of CAD with history of ischemic cardimyopathy with EF of 35% per cath in 2011, stents to RCA and mid LAD in 2011, orthostatic hypotension, and hypercholesterolemia.  He has been doing well with the exception of chronic dizziness and weakness which occurs on a daily basis, most notably with standing, bending or climbing steps. He is not on any antihypertensives or BB.  No chest pain, no severe DOE, no syncope or edema.    Allergies  Allergen Reactions  . Aspirin     Current Outpatient Prescriptions  Medication Sig Dispense Refill  . allopurinol (ZYLOPRIM) 300 MG tablet Take 300 mg by mouth daily.        . meclizine (ANTIVERT) 12.5 MG tablet Take 12.5 mg by mouth every 6 (six) hours as needed.        . Multiple Vitamins-Minerals (MULTIVITAMIN WITH MINERALS) tablet Take 1 tablet by mouth daily.        . nitroGLYCERIN (NITROSTAT) 0.4 MG SL tablet Place 1 tablet (0.4 mg total) under the tongue every 5 (five) minutes as needed.  25 tablet  10  . PLAVIX 75 MG tablet TAKE 1 TABLET BY MOUTH   ONCE A DAY WITH A MEAL.  30 each  8  . PRAVACHOL 40 MG tablet TAKE (1) TABLET BY MOUTH AT BEDTIME FORCHOLESTEROL.  30 each  6  . zolpidem (AMBIEN) 5 MG tablet Take 1 tablet (5 mg total) by mouth at bedtime as needed. Dose decrease/pt. Does not need refill at this time  30 tablet  3    Past Medical History  Diagnosis Date  . ASCVD (arteriosclerotic cardiovascular disease)     Sizable non-Q. myocardial infarction in 09/2009; severe three-vessel disease with an ejection fraction of 35%; three-vessel DES complicated by thrombocytopenia and mild renal insufficiency  . Hyperlipidemia     09/2010: TC-151, TG-411, H.-32, L.-not calculated   . Anemia     H/H -12.3/37.2; MCV-103; platelets-114 (08/2010)   . 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 Hypertension     . Orthostatic hypotension     Lightheaded; no syncope  . Thrombocytopenia, acquired     09/2010-114,000    Past Surgical History  Procedure Date  . Inguinal hernia repair     Right  . Appendectomy     ZOX:WRUEAV of systems complete and found to be negative unless listed above PHYSICAL EXAM BP 89/63  Pulse 89  Ht 6\' 1"  (1.854 m)  Wt 187 lb (84.823 kg)  BMI 24.67 kg/m2  SpO2 95%  General: Well developed, well nourished, in no acute distress. Skin is pale, he is HOH. Head: Eyes PERRLA, No xanthomas.   Normal cephalic and atramatic  Lungs: Clear bilaterally to auscultation and percussion. Heart: HRRR S1 S2, 1/6 systolic.  Pulses are 2+ & equal.            No carotid bruit. No JVD.  No abdominal bruits. No femoral bruits. Abdomen: Bowel sounds are positive, abdomen soft and non-tender without masses or                  Hernia's noted. Msk:  Back normal, normal gait.  Diminished strength and tone for age. Extremities: No clubbing, cyanosis or edema.  DP +1 Neuro: Alert and oriented X 3. Psych:  Good affect, responds appropriately  ASSESSMENT AND PLAN

## 2011-06-13 LAB — CBC WITH DIFFERENTIAL/PLATELET
Eosinophils Relative: 4 % (ref 0–5)
HCT: 34 % — ABNORMAL LOW (ref 39.0–52.0)
Hemoglobin: 11.3 g/dL — ABNORMAL LOW (ref 13.0–17.0)
Lymphocytes Relative: 60 % — ABNORMAL HIGH (ref 12–46)
Lymphs Abs: 5 10*3/uL — ABNORMAL HIGH (ref 0.7–4.0)
MCV: 109.3 fL — ABNORMAL HIGH (ref 78.0–100.0)
Monocytes Absolute: 0.5 10*3/uL (ref 0.1–1.0)
Monocytes Relative: 6 % (ref 3–12)
Neutro Abs: 2.6 10*3/uL (ref 1.7–7.7)
RBC: 3.11 MIL/uL — ABNORMAL LOW (ref 4.22–5.81)
WBC: 8.4 10*3/uL (ref 4.0–10.5)

## 2011-07-03 ENCOUNTER — Ambulatory Visit (INDEPENDENT_AMBULATORY_CARE_PROVIDER_SITE_OTHER): Payer: Medicare Other

## 2011-07-03 DIAGNOSIS — D649 Anemia, unspecified: Secondary | ICD-10-CM

## 2011-07-05 ENCOUNTER — Encounter: Payer: Self-pay | Admitting: Cardiology

## 2011-07-16 ENCOUNTER — Ambulatory Visit (INDEPENDENT_AMBULATORY_CARE_PROVIDER_SITE_OTHER): Payer: Medicare Other | Admitting: Cardiology

## 2011-07-16 ENCOUNTER — Encounter: Payer: Self-pay | Admitting: Cardiology

## 2011-07-16 VITALS — BP 96/65 | HR 94 | Ht 73.0 in | Wt 189.0 lb

## 2011-07-16 DIAGNOSIS — D649 Anemia, unspecified: Secondary | ICD-10-CM

## 2011-07-16 DIAGNOSIS — I10 Essential (primary) hypertension: Secondary | ICD-10-CM

## 2011-07-16 DIAGNOSIS — E785 Hyperlipidemia, unspecified: Secondary | ICD-10-CM

## 2011-07-16 DIAGNOSIS — D696 Thrombocytopenia, unspecified: Secondary | ICD-10-CM

## 2011-07-16 DIAGNOSIS — I447 Left bundle-branch block, unspecified: Secondary | ICD-10-CM

## 2011-07-16 DIAGNOSIS — I951 Orthostatic hypotension: Secondary | ICD-10-CM

## 2011-07-16 DIAGNOSIS — I2510 Atherosclerotic heart disease of native coronary artery without angina pectoris: Secondary | ICD-10-CM

## 2011-07-16 MED ORDER — ZOLPIDEM TARTRATE 5 MG PO TABS: 5.0000 mg | ORAL_TABLET | Freq: Every evening | ORAL | Status: DC | PRN

## 2011-07-16 MED ORDER — FLUDROCORTISONE 0.1 MG/ML ORAL SUSPENSION: 0.1000 mg | Freq: Every day | ORAL | Status: DC

## 2011-07-16 NOTE — Assessment & Plan Note (Signed)
Mild thrombocytopenia is stable to improved.

## 2011-07-16 NOTE — Patient Instructions (Addendum)
Your physician recommends that you schedule a follow-up appointment in: 4 months.  Your physician has recommended you make the following change in your medication: discontinue Plavix, discontinue meclizine (Antivert).  Start taking Florinef 0.1 mg once daily.  Use Ambien as needed rather than each night.  Your physician recommends that you return for lab work in 6 weeks for a Basic Metabolic Panel.

## 2011-07-16 NOTE — Assessment & Plan Note (Signed)
With recurrent mild anemia, macrocytosis and thrombocytopenia as well as a lymphocytosis, evaluation by a hematologist may be warranted.

## 2011-07-16 NOTE — Assessment & Plan Note (Addendum)
No symptoms at present to suggest symptomatic coronary artery disease.  Our focus will continue to be on optimal control of cardiovascular risk factors.  Plavix typically could be discontinued after a 2 year course following MI and three-vessel intervention, but will be necessary indefinitely due to aspirin allergy.

## 2011-07-16 NOTE — Assessment & Plan Note (Addendum)
Blood pressure is only slightly elevated in the absence of any antihypertensive therapy.  It does not appear that addition of a pharmacologic agent to lower blood pressure is necessary.

## 2011-07-16 NOTE — Progress Notes (Signed)
Patient ID: Lee Ramirez, male   DOB: 12-Oct-1921, 77 y.o.   MRN: [id number] HPI: Lee Ramirez returns the office as scheduled for continued assessment and treatment of near syncope with orthostatic hypotension, left bundle branch block and multiple additional medical problems.  Since his last visit, he has done fairly well.  He reports episodes of lightheadedness and weakness without falls or syncope occurring a few times per week.  He is no longer taking fludrocortisone and does not know how this medication came to be stopped.  He describes no seizure activity, no incontinence, no injury including oral injury and no orthopnea nor PND.  Sitting blood pressures at home have all been less than 140 systolic and less than 90 diastolic, but no low systolics have been identified.  Prior to Admission medications   Medication Sig Start Date End Date Taking? Authorizing Provider  allopurinol (ZYLOPRIM) 300 MG tablet Take 300 mg by mouth daily.     Yes Historical Provider, MD  cyanocobalamin 100 MCG tablet Take 100 mcg by mouth daily.     Yes Historical Provider, MD  Multiple Vitamins-Minerals (MULTIVITAMIN WITH MINERALS) tablet Take 1 tablet by mouth daily.     Yes Historical Provider, MD  nitroGLYCERIN (NITROSTAT) 0.4 MG SL tablet Place 1 tablet (0.4 mg total) under the tongue every 5 (five) minutes as needed. 12/29/10  Yes Micheline Chapman, MD  PRAVACHOL 40 MG tablet TAKE (1) TABLET BY MOUTH AT BEDTIME FORCHOLESTEROL. 03/22/11  Yes Gerrit Friends. Tuana Hoheisel, MD  zolpidem (AMBIEN) 5 MG tablet Take 1 tablet (5 mg total) by mouth at bedtime as needed. Dose decrease/pt. Does not need refill at this time 07/16/11  Yes Gerrit Friends. Kahlel Peake, MD  fludrocortisone (FLORINEF) 0.1mg /mL SUSP Take 1 mL (0.1 mg total) by mouth daily. 07/16/11   Gerrit Friends. Dietrich Pates, MD   Allergies  Allergen Reactions  . Aspirin   Past medical history, social history, and family history reviewed and updated.  ROS: See history of present  illness  PHYSICAL EXAM: BP 96/65  Pulse 94  Ht 6\' 1"  (1.854 m)  Wt 85.73 kg (189 lb)  BMI 24.94 kg/m2; blood pressure 135/90 lying and 95/65 standing with a 10 bpm increase in heart rate General-Well developed; no acute distress Body habitus-proportionate weight and height Neck-No JVD; no carotid bruits Lungs-clear lung fields; resonant to percussion Cardiovascular-normal PMI; normal S1 and S2; minor systolic ejection murmur Abdomen-normal bowel sounds; soft and non-tender without masses or organomegaly Musculoskeletal-No deformities, no cyanosis or clubbing Neurologic-Normal cranial nerves; symmetric strength and tone Skin-Warm, no significant lesions Extremities-distal pulses intact; no edema  Rhythm Strip: Normal sinus rhythm, first degree AV block, IVCD.  ASSESSMENT AND PLAN:  [place] Bing, MD 07/16/2011 7:15 PM

## 2011-07-16 NOTE — Assessment & Plan Note (Signed)
Orthostatic hypotension has become more severe and more symptomatic following discontinuation of fludrocortisone for unclear reasons.  That medication will be resumed at the prior effective dose of only 0.1 mg per day.

## 2011-07-16 NOTE — Assessment & Plan Note (Signed)
Lipid profile is relatively good with moderate dose pravastatin.  Consideration will be given to a dose increase or change to a more potent lipid-lowering agent.

## 2011-07-18 ENCOUNTER — Encounter: Payer: Self-pay | Admitting: Cardiology

## 2011-07-18 ENCOUNTER — Other Ambulatory Visit: Payer: Self-pay | Admitting: *Deleted

## 2011-07-18 DIAGNOSIS — I454 Nonspecific intraventricular block: Secondary | ICD-10-CM

## 2011-08-09 ENCOUNTER — Other Ambulatory Visit: Payer: Self-pay | Admitting: Cardiology

## 2011-08-10 LAB — BASIC METABOLIC PANEL
BUN: 11 mg/dL (ref 6–23)
CO2: 25 mEq/L (ref 19–32)
Chloride: 107 mEq/L (ref 96–112)
Creat: 1.11 mg/dL (ref 0.50–1.35)

## 2011-08-21 DIAGNOSIS — K760 Fatty (change of) liver, not elsewhere classified: Secondary | ICD-10-CM

## 2011-08-21 HISTORY — DX: Fatty (change of) liver, not elsewhere classified: K76.0

## 2011-09-04 ENCOUNTER — Encounter: Payer: Self-pay | Admitting: *Deleted

## 2011-09-07 ENCOUNTER — Telehealth: Payer: Self-pay | Admitting: Cardiology

## 2011-09-07 NOTE — Telephone Encounter (Signed)
States he received letter saying that he is due for lab work but there was no order form in it.  I can not find where he is due for any. / tg

## 2011-09-10 ENCOUNTER — Telehealth: Payer: Self-pay | Admitting: *Deleted

## 2011-09-10 NOTE — Telephone Encounter (Signed)
Advised patient to disregard letter, as it was not done when expected, however results were received on 15th.

## 2011-10-18 ENCOUNTER — Emergency Department (HOSPITAL_COMMUNITY): Payer: Medicare Other

## 2011-10-18 ENCOUNTER — Encounter (HOSPITAL_COMMUNITY): Payer: Self-pay

## 2011-10-18 ENCOUNTER — Emergency Department (HOSPITAL_COMMUNITY)
Admission: EM | Admit: 2011-10-18 | Discharge: 2011-10-18 | Disposition: A | Discharge status: Home / Self Care | Payer: Medicare Other | Attending: Emergency Medicine | Admitting: Emergency Medicine

## 2011-10-18 ENCOUNTER — Other Ambulatory Visit: Payer: Self-pay

## 2011-10-18 DIAGNOSIS — R05 Cough: Secondary | ICD-10-CM | POA: Insufficient documentation

## 2011-10-18 DIAGNOSIS — R110 Nausea: Secondary | ICD-10-CM | POA: Insufficient documentation

## 2011-10-18 DIAGNOSIS — R5381 Other malaise: Secondary | ICD-10-CM | POA: Insufficient documentation

## 2011-10-18 DIAGNOSIS — J189 Pneumonia, unspecified organism: Secondary | ICD-10-CM | POA: Insufficient documentation

## 2011-10-18 DIAGNOSIS — R0789 Other chest pain: Secondary | ICD-10-CM

## 2011-10-18 DIAGNOSIS — I2510 Atherosclerotic heart disease of native coronary artery without angina pectoris: Secondary | ICD-10-CM | POA: Insufficient documentation

## 2011-10-18 DIAGNOSIS — E785 Hyperlipidemia, unspecified: Secondary | ICD-10-CM | POA: Insufficient documentation

## 2011-10-18 DIAGNOSIS — R059 Cough, unspecified: Secondary | ICD-10-CM | POA: Insufficient documentation

## 2011-10-18 DIAGNOSIS — R42 Dizziness and giddiness: Secondary | ICD-10-CM | POA: Insufficient documentation

## 2011-10-18 LAB — COMPREHENSIVE METABOLIC PANEL
AST: 15 U/L (ref 0–37)
Albumin: 3.7 g/dL (ref 3.5–5.2)
Calcium: 10.1 mg/dL (ref 8.4–10.5)
Creatinine, Ser: 1.17 mg/dL (ref 0.50–1.35)

## 2011-10-18 LAB — CARDIAC PANEL(CRET KIN+CKTOT+MB+TROPI)
CK, MB: 2 ng/mL (ref 0.3–4.0)
Total CK: 66 U/L (ref 7–232)
Troponin I: <0.3 ng/mL (ref <0.30)

## 2011-10-18 LAB — CBC
MCH: 37.5 pg — ABNORMAL HIGH (ref 26.0–34.0)
MCHC: 34.6 g/dL (ref 30.0–36.0)
Platelets: 88 10*3/uL — ABNORMAL LOW (ref 150–400)

## 2011-10-18 LAB — POCT I-STAT TROPONIN I: Troponin i, poc: 0.01 ng/mL (ref 0.00–0.08)

## 2011-10-18 MED ORDER — ONDANSETRON HCL 4 MG PO TABS: 4.0000 mg | ORAL_TABLET | Freq: Four times a day (QID) | ORAL | Status: AC

## 2011-10-18 MED ORDER — AZITHROMYCIN 250 MG PO TABS: 250.0000 mg | ORAL_TABLET | Freq: Every day | ORAL | Status: AC

## 2011-10-18 NOTE — Discharge Instructions (Signed)
Chest Pain (Nonspecific) It is often hard to give a specific diagnosis for the cause of chest pain. There is always a chance that your pain could be related to something serious, such as a heart attack or a blood clot in the lungs. You need to follow up with your caregiver for further evaluation. CAUSES   Heartburn.   Pneumonia or bronchitis.   Anxiety and stress.   Inflammation around your heart (pericarditis) or lung (pleuritis or pleurisy).   A blood clot in the lung.   A collapsed lung (pneumothorax). It can develop suddenly on its own (spontaneous pneumothorax) or from injury (trauma) to the chest.  The chest wall is composed of bones, muscles, and cartilage. Any of these can be the source of the pain.  The bones can be bruised by injury.   The muscles or cartilage can be strained by coughing or overwork.   The cartilage can be affected by inflammation and become sore (costochondritis).  DIAGNOSIS  Lab tests or other studies, such as X-rays, an EKG, stress testing, or cardiac imaging, may be needed to find the cause of your pain.  TREATMENT   Treatment depends on what may be causing your chest pain. Treatment may include:   Acid blockers for heartburn.   Anti-inflammatory medicine.   Pain medicine for inflammatory conditions.   Antibiotics if an infection is present.   You may be advised to change lifestyle habits. This includes stopping smoking and avoiding caffeine and chocolate.   You may be advised to keep your head raised (elevated) when sleeping. This reduces the chance of acid going backward from your stomach into your esophagus.   Most of the time, nonspecific chest pain will improve within 2 to 3 days with rest and mild pain medicine.  HOME CARE INSTRUCTIONS   If antibiotics were prescribed, take the full amount even if you start to feel better.   For the next few days, avoid physical activities that bring on chest pain. Continue physical activities as  directed.   Do not smoke cigarettes or drink alcohol until your symptoms are gone.   Only take over-the-counter or prescription medicine for pain, discomfort, or fever as directed by your caregiver.   Follow your caregiver's suggestions for further testing if your chest pain does not go away.   Keep any follow-up appointments you made. If you do not go to an appointment, you could develop lasting (chronic) problems with pain. If there is any problem keeping an appointment, you must call to reschedule.  SEEK MEDICAL CARE IF:   You think you are having problems from the medicine you are taking. Read your medicine instructions carefully.   Your chest pain does not go away, even after treatment.   You develop a rash with blisters on your chest.  SEEK IMMEDIATE MEDICAL CARE IF:   You have increased chest pain or pain that spreads to your arm, neck, jaw, back, or belly (abdomen).   You develop shortness of breath, an increasing cough, or you are coughing up blood.   You have severe back or abdominal pain, feel sick to your stomach (nauseous) or throw up (vomit).   You develop severe weakness, fainting, or chills.   You have an oral temperature above 102 F (38.9 C), not controlled by medicine.  THIS IS AN EMERGENCY. Do not wait to see if the pain will go away. Get medical help at once. Call your local emergency services (911 in [place] Do not drive yourself to   the hospital. MAKE SURE YOU:   Understand these instructions.   Will watch your condition.   Will get help right away if you are not doing well or get worse.  Document Released: 05/16/2005 Document Revised: 04/18/2011 Document Reviewed: 03/11/2008 [employer] Patient Information 2012 [employer]. 

## 2011-10-18 NOTE — ED Notes (Signed)
Complain of chest pain and SOB

## 2011-10-18 NOTE — ED Notes (Signed)
Pt reports eating spicy pizza for dinner last night, awoke at midnight with mild chest pain and SOB. Pt states was diaphoretic secondary to pain, and "spit-up " his coffee this morning. Pt has history of prior MI's and stents placed.  Pt appears to be in no distress at this time. VS WNL.

## 2011-10-18 NOTE — ED Provider Notes (Signed)
History     CSN: [id number]  Arrival date & time 10/18/11  1135   First MD Initiated Contact with Patient 10/18/11 1325      Chief Complaint  Patient presents with  . Chest Pain    (Consider location/radiation/quality/duration/timing/severity/associated sxs/prior treatment) HPI Comments: Patient has history of CAD, with stents.  Started feeling badly last night.  Patient is a 78 y.o. male presenting with chest pain. The history is provided by the patient.  Chest Pain The chest pain began yesterday. Chest pain occurs constantly. The chest pain is unchanged. The severity of the pain is moderate. The quality of the pain is described as aching. The pain does not radiate. Chest pain is worsened by certain positions and deep breathing. Primary symptoms include fatigue, cough, nausea and dizziness. Pertinent negatives for primary symptoms include no fever, no wheezing, no palpitations and no vomiting.  Dizziness also occurs with nausea. Dizziness does not occur with vomiting or diaphoresis.   Pertinent negatives for associated symptoms include no diaphoresis.     Past Medical History  Diagnosis Date  . ASCVD (arteriosclerotic cardiovascular disease)     Sizable non-Q. myocardial infarction in 09/2009; severe three-vessel disease with an ejection fraction of 35%; three-vessel DES complicated by thrombocytopenia and mild renal insufficiency  . Hyperlipidemia     09/2010: TC-151, TG-411, H.-32, L.-not calculated   . Anemia     H/H -12.3/37.2; MCV-103; platelets-114 (08/2010)   . 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 Hypertension   . Orthostatic hypotension     Lightheaded; no syncope  . Thrombocytopenia, acquired     09/2010-114,000    Past Surgical History  Procedure Date  . Inguinal hernia repair     Right  . Appendectomy     No family history on file.  History  Substance Use Topics  . Smoking status: Never Smoker   . Smokeless  tobacco: Never Used  . Alcohol Use: No      Review of Systems  Constitutional: Positive for fatigue. Negative for fever, chills and diaphoresis.  Respiratory: Positive for cough. Negative for wheezing.   Cardiovascular: Positive for chest pain. Negative for palpitations.  Gastrointestinal: Positive for nausea. Negative for vomiting.  Neurological: Positive for dizziness.  All other systems reviewed and are negative.    Allergies  Aspirin  Home Medications   Current Outpatient Rx  Name Route Sig Dispense Refill  . ACETAMINOPHEN 500 MG PO TABS Oral Take 1,000 mg by mouth every 6 (six) hours as needed. Pain    . ALLOPURINOL 300 MG PO TABS Oral Take 300 mg by mouth daily.      . CYANOCOBALAMIN 100 MCG PO TABS Oral Take 100 mcg by mouth daily.      Marland Kitchen FLUDROCORTISONE ACETATE 0.1 MG PO TABS Oral Take 0.1 mg by mouth daily.    Marland Kitchen MECLIZINE HCL 12.5 MG PO TABS Oral Take 12.5 mg by mouth 3 (three) times daily as needed. Dizziness    . MULTI-VITAMIN/MINERALS PO TABS Oral Take 1 tablet by mouth daily.     Marland Kitchen PRAVASTATIN SODIUM 40 MG PO TABS      . TRAVOPROST (BAK FREE) 0.004 % OP SOLN  1 drop at bedtime.    Marland Kitchen ZOLPIDEM TARTRATE 5 MG PO TABS Oral Take 5 mg by mouth at bedtime as needed. Sleep    . NITROGLYCERIN 0.4 MG SL SUBL Sublingual Place 1 tablet (0.4 mg total) under the tongue every  5 (five) minutes as needed. 25 tablet 10    BP 151/90  Pulse 96  Temp(Src) 98.4 F (36.9 C) (Oral)  Resp 16  Ht 6\' 2"  (1.88 m)  Wt 183 lb (83.008 kg)  BMI 23.50 kg/m2  SpO2 96%  Physical Exam  Nursing note and vitals reviewed. Constitutional: He is oriented to person, place, and time. He appears well-developed and well-nourished. No distress.  HENT:  Head: Normocephalic and atraumatic.  Mouth/Throat: Oropharynx is clear and moist.  Eyes: Pupils are equal, round, and reactive to light.  Neck: Normal range of motion. Neck supple.  Cardiovascular: Normal rate and regular rhythm.   No murmur  heard. Pulmonary/Chest: Effort normal and breath sounds normal. No respiratory distress. He has no wheezes.  Abdominal: Soft. Bowel sounds are normal. He exhibits no distension. There is no tenderness.  Musculoskeletal: Normal range of motion. He exhibits no edema.  Neurological: He is alert and oriented to person, place, and time.  Skin: Skin is warm and dry. He is not diaphoretic.    ED Course  Procedures (including critical care time)  Labs Reviewed  CBC - Abnormal; Notable for the following:    RBC 2.69 (*)    Hemoglobin 10.1 (*)    HCT 29.2 (*)    MCV 108.6 (*)    MCH 37.5 (*)    Platelets 88 (*)    All other components within normal limits  COMPREHENSIVE METABOLIC PANEL - Abnormal; Notable for the following:    Potassium 3.1 (*)    Glucose, Bld 112 (*)    GFR calc non Af Amer 54 (*)    GFR calc Af Amer 63 (*)    All other components within normal limits  CARDIAC PANEL(CRET KIN+CKTOT+MB+TROPI)  POCT I-STAT TROPONIN I   Dg Chest Portable 1 View  10/18/2011  *RADIOLOGY REPORT*  Clinical Data: Chest pain, cough, dizziness, history prostate cancer, hypertension  PORTABLE CHEST - 1 VIEW  Comparison: Portable exam 1208 hours compared to 10/02/2009  Findings: Enlargement of cardiac silhouette. Slight pulmonary vascular congestion. Atherosclerotic calcification of thoracic aorta. Chronic bronchitic changes. Right basilar atelectasis with left lower lobe atelectasis versus infiltrate. Upper lungs clear. Bones demineralized.  IMPRESSION: Enlargement of cardiac silhouette. Chronic bronchitic changes with right basilar atelectasis and either atelectasis or infiltrate in left lower lobe.  Original Report Authenticated By: Lollie Marrow, M.D.     No diagnosis found.   Date: 10/18/2011  Rate: 103  Rhythm: sinus tachycardia  QRS Axis: left  Intervals: normal  ST/T Wave abnormalities: nonspecific ST changes  Conduction Disutrbances:nonspecific intraventricular conduction delay   Narrative Interpretation:   Old EKG Reviewed: unchanged    MDM  The patient appears to have a pneumonia on his xray.  He is afebrile and oxygen saturations are adequate.  He has a history of CAD with stents but the ekg and enzymes are normal.  He tells me his symptoms are unlike his prior cardiac sytmptoms.  I had discussed admission with the patient and daughter, however they tell me he has a wife with dementia he absolutely needs to be home with.  I will prescribe zithromax and zofran.  He promises to return if his symptoms worsen.        Geoffery Lyons, MD 10/18/11 [phone]

## 2011-10-21 ENCOUNTER — Emergency Department (HOSPITAL_COMMUNITY): Payer: Medicare Other

## 2011-10-21 ENCOUNTER — Encounter (HOSPITAL_COMMUNITY): Payer: Self-pay

## 2011-10-21 ENCOUNTER — Emergency Department (HOSPITAL_COMMUNITY)
Admission: EM | Admit: 2011-10-21 | Discharge: 2011-10-21 | Disposition: A | Discharge status: Home / Self Care | Payer: Medicare Other | Attending: Emergency Medicine | Admitting: Emergency Medicine

## 2011-10-21 DIAGNOSIS — R0602 Shortness of breath: Secondary | ICD-10-CM | POA: Insufficient documentation

## 2011-10-21 DIAGNOSIS — M25519 Pain in unspecified shoulder: Secondary | ICD-10-CM | POA: Insufficient documentation

## 2011-10-21 DIAGNOSIS — I2510 Atherosclerotic heart disease of native coronary artery without angina pectoris: Secondary | ICD-10-CM | POA: Insufficient documentation

## 2011-10-21 DIAGNOSIS — I10 Essential (primary) hypertension: Secondary | ICD-10-CM | POA: Insufficient documentation

## 2011-10-21 DIAGNOSIS — Z8546 Personal history of malignant neoplasm of prostate: Secondary | ICD-10-CM | POA: Insufficient documentation

## 2011-10-21 DIAGNOSIS — Z79899 Other long term (current) drug therapy: Secondary | ICD-10-CM | POA: Insufficient documentation

## 2011-10-21 DIAGNOSIS — E785 Hyperlipidemia, unspecified: Secondary | ICD-10-CM | POA: Insufficient documentation

## 2011-10-21 DIAGNOSIS — M25511 Pain in right shoulder: Secondary | ICD-10-CM

## 2011-10-21 DIAGNOSIS — R05 Cough: Secondary | ICD-10-CM | POA: Insufficient documentation

## 2011-10-21 DIAGNOSIS — R059 Cough, unspecified: Secondary | ICD-10-CM | POA: Insufficient documentation

## 2011-10-21 DIAGNOSIS — J40 Bronchitis, not specified as acute or chronic: Secondary | ICD-10-CM

## 2011-10-21 MED ORDER — IPRATROPIUM BROMIDE 0.02 % IN SOLN
0.5000 mg | Freq: Once | RESPIRATORY_TRACT | Status: AC
  Administered 2011-10-21: 0.5 mg via RESPIRATORY_TRACT
  Filled 2011-10-21: qty 2.5

## 2011-10-21 MED ORDER — ALBUTEROL SULFATE HFA 108 (90 BASE) MCG/ACT IN AERS
2.0000 | INHALATION_SPRAY | RESPIRATORY_TRACT | Status: DC | PRN
  Filled 2011-10-21: qty 6.7

## 2011-10-21 MED ORDER — HYDROCODONE-ACETAMINOPHEN 5-325 MG PO TABS: ORAL_TABLET | ORAL | Status: DC

## 2011-10-21 MED ORDER — ALBUTEROL SULFATE HFA 108 (90 BASE) MCG/ACT IN AERS
1.0000 | INHALATION_SPRAY | Freq: Four times a day (QID) | RESPIRATORY_TRACT | Status: DC | PRN
  Administered 2011-10-21: 2 via RESPIRATORY_TRACT

## 2011-10-21 MED ORDER — DOCUSATE SODIUM 100 MG PO CAPS: 100.0000 mg | ORAL_CAPSULE | Freq: Two times a day (BID) | ORAL | Status: AC

## 2011-10-21 MED ORDER — ALBUTEROL SULFATE (5 MG/ML) 0.5% IN NEBU
5.0000 mg | INHALATION_SOLUTION | Freq: Once | RESPIRATORY_TRACT | Status: AC
  Administered 2011-10-21: 5 mg via RESPIRATORY_TRACT
  Filled 2011-10-21: qty 1

## 2011-10-21 MED ORDER — HYDROCODONE-ACETAMINOPHEN 5-325 MG PO TABS
1.0000 | ORAL_TABLET | Freq: Once | ORAL | Status: AC
  Administered 2011-10-21: 1 via ORAL
  Filled 2011-10-21: qty 1

## 2011-10-21 NOTE — Discharge Instructions (Signed)
 Bronchitis Bronchitis is the body's way of reacting to injury and/or infection (inflammation) of the bronchi. Bronchi are the air tubes that extend from the windpipe into the lungs. If the inflammation becomes severe, it may cause shortness of breath. CAUSES  Inflammation may be caused by:  A virus.   Germs (bacteria).   Dust.   Allergens.   Pollutants and many other irritants.  The cells lining the bronchial tree are covered with tiny hairs (cilia). These constantly beat upward, away from the lungs, toward the mouth. This keeps the lungs free of pollutants. When these cells become too irritated and are unable to do their job, mucus begins to develop. This causes the characteristic cough of bronchitis. The cough clears the lungs when the cilia are unable to do their job. Without either of these protective mechanisms, the mucus would settle in the lungs. Then you would develop pneumonia. Smoking is a common cause of bronchitis and can contribute to pneumonia. Stopping this habit is the single most important thing you can do to help yourself. TREATMENT   Your caregiver may prescribe an antibiotic if the cough is caused by bacteria. Also, medicines that open up your airways make it easier to breathe. Your caregiver may also recommend or prescribe an expectorant. It will loosen the mucus to be coughed up. Only take over-the-counter or prescription medicines for pain, discomfort, or fever as directed by your caregiver.   Removing whatever causes the problem (smoking, for example) is critical to preventing the problem from getting worse.   Cough suppressants may be prescribed for relief of cough symptoms.   Inhaled medicines may be prescribed to help with symptoms now and to help prevent problems from returning.   For those with recurrent (chronic) bronchitis, there may be a need for steroid medicines.  SEEK IMMEDIATE MEDICAL CARE IF:   During treatment, you develop more pus-like mucus  (purulent sputum).   You have a fever.   Your baby is older than 3 months with a rectal temperature of 102 F (38.9 C) or higher.   Your baby is 75 months old or younger with a rectal temperature of 100.4 F (38 C) or higher.   You become progressively more ill.   You have increased difficulty breathing, wheezing, or shortness of breath.  It is necessary to seek immediate medical care if you are elderly or sick from any other disease. MAKE SURE YOU:   Understand these instructions.   Will watch your condition.   Will get help right away if you are not doing well or get worse.  Document Released: 08/06/2005 Document Revised: 07/26/2011 Document Reviewed: 06/15/2008 [facility] Patient Information 2012 Agency, [place].   Narcotic and benzodiazepine use may cause drowsiness, slowed breathing or dependence.  Please use with caution and do not drive, operate machinery or watch young children alone while taking them.  Taking combinations of these medications or drinking alcohol will potentiate these effects.  I suggest taking a stool softener while taking narcotic pain medications.

## 2011-10-21 NOTE — ED Notes (Signed)
Ambulated to bathroom.  Beginning Sat 94-95 had just removed 02.  Lowest Sat 92% which returned to 94% when back in room sitting on stretcher.  Tolerated well.

## 2011-10-21 NOTE — ED Notes (Signed)
RT in to educate patient on inhaler with spacer.  DC home with family.  Feels much better

## 2011-10-21 NOTE — ED Notes (Addendum)
Pt presents with SOB since this afternoon. Pt states he took someone else pain medication and SOB started 2 hrs after taking pain medication. Pt also has right sided shoulder pain.

## 2011-10-21 NOTE — ED Notes (Signed)
Paged RT.

## 2011-10-21 NOTE — ED Notes (Signed)
Pt c/o some sob yesterday and today.  Pt reports being dx with pna here last week.  Pt reports taking his antibiotics as prescribed at home.  nad noted

## 2011-10-21 NOTE — ED Provider Notes (Signed)
History     CSN: [id number]  Arrival date & time 10/21/11  1704   First MD Initiated Contact with Patient 10/21/11 1740      Chief Complaint  Patient presents with  . Shortness of Breath    (Consider location/radiation/quality/duration/timing/severity/associated sxs/prior treatment) HPI Comments: Patient developed coughing without chest pain and a little shortness of breath about 3 days ago, was seen in the emergency department and had a chest x-ray which suggested pneumonia. The patient did not want to be admitted at the time primarily because he takes care of his wife who has Alzheimer's disease. He felt well enough to be go home. He was given a prescription for a Z-Pak. He has taken a couple days of antibiotic. However he has also been complaining of about 2 or 3 days of intense right shoulder pain which is sharp in nature and worse with certain movements of his shoulder. He has never had pain in his shoulder in the past. He does have a history of gout but always affects his great toe. He had taken some Tylenol for the pain without significant improvement. He denied any chest pain again denied sweating or vomiting. He denies numbness or weakness to his right arm or hand. He denied neck pain. He denies any recent trauma. He did take one of his family members Percocet which didn't relieve his pain. However today following the second dose of Percocet he did develop some shortness of breath and therefore was brought to the emergency department for evaluation. He does not have a history of asthma or emphysema, no history of congestive heart failure. He does not use home oxygen. He denies any pleurisy. He denies any lower leg extremity swelling or weakness. Of note, about a week ago he did have some skin cancer lesions removed. One of these lesions removed was in his posterior right shoulder, however that area is not the area of discomfort per the patient.  Patient is a 78 y.o. male presenting with  shortness of breath. The history is provided by the patient and a relative.  Shortness of Breath  Associated symptoms include shortness of breath.    Past Medical History  Diagnosis Date  . ASCVD (arteriosclerotic cardiovascular disease)     Sizable non-Q. myocardial infarction in 09/2009; severe three-vessel disease with an ejection fraction of 35%; three-vessel DES complicated by thrombocytopenia and mild renal insufficiency  . Hyperlipidemia     09/2010: TC-151, TG-411, H.-32, L.-not calculated   . Anemia     H/H -12.3/37.2; MCV-103; platelets-114 (08/2010)   . 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 Hypertension   . Orthostatic hypotension     Lightheaded; no syncope  . Thrombocytopenia, acquired     09/2010-114,000    Past Surgical History  Procedure Date  . Inguinal hernia repair     Right  . Appendectomy     History reviewed. No pertinent family history.  History  Substance Use Topics  . Smoking status: Never Smoker   . Smokeless tobacco: Never Used  . Alcohol Use: No      Review of Systems  Constitutional: Negative.   HENT: Negative for neck pain.   Respiratory: Positive for shortness of breath. Negative for chest tightness.   Musculoskeletal: Positive for arthralgias. Negative for back pain and joint swelling.  Skin: Positive for wound. Negative for color change and rash.  Neurological: Negative for weakness, numbness and headaches.    Allergies  Aspirin  Home Medications   Current Outpatient Rx  Name Route Sig Dispense Refill  . ACETAMINOPHEN 500 MG PO TABS Oral Take 1,000 mg by mouth every 6 (six) hours as needed. Pain    . ALLOPURINOL 300 MG PO TABS Oral Take 300 mg by mouth daily.      . AZITHROMYCIN 250 MG PO TABS Oral Take 1 tablet (250 mg total) by mouth daily. Take first 2 tablets together, then 1 every day until finished. 6 tablet 0  . CYANOCOBALAMIN 100 MCG PO TABS Oral Take 100 mcg by mouth daily.       Marland Kitchen FLUDROCORTISONE ACETATE 0.1 MG PO TABS Oral Take 0.1 mg by mouth daily.    Marland Kitchen MECLIZINE HCL 12.5 MG PO TABS Oral Take 12.5 mg by mouth 3 (three) times daily as needed. Dizziness    . MULTI-VITAMIN/MINERALS PO TABS Oral Take 1 tablet by mouth daily.     Marland Kitchen NITROGLYCERIN 0.4 MG SL SUBL Sublingual Place 1 tablet (0.4 mg total) under the tongue every 5 (five) minutes as needed. 25 tablet 10  . ONDANSETRON HCL 4 MG PO TABS Oral Take 1 tablet (4 mg total) by mouth every 6 (six) hours. 12 tablet 0  . PRAVASTATIN SODIUM 40 MG PO TABS      . TRAVOPROST (BAK FREE) 0.004 % OP SOLN  1 drop at bedtime.    Marland Kitchen ZOLPIDEM TARTRATE 5 MG PO TABS Oral Take 5 mg by mouth at bedtime as needed. Sleep      BP 144/94  Pulse 90  Temp(Src) 97.6 F (36.4 C) (Oral)  Resp 18  Ht 6\' 2"  (1.88 m)  Wt 185 lb (83.915 kg)  BMI 23.75 kg/m2  SpO2 94%  Physical Exam  Nursing note and vitals reviewed. Constitutional: He appears well-developed and well-nourished.  HENT:  Head: Normocephalic and atraumatic.  Eyes: Conjunctivae are normal. Pupils are equal, round, and reactive to light.  Neck: Normal range of motion. Neck supple.  Cardiovascular: Normal rate and regular rhythm.   Pulmonary/Chest: Effort normal and breath sounds normal.  Abdominal: Soft. Bowel sounds are normal.  Musculoskeletal: He exhibits tenderness. He exhibits no edema.       Arms:      Area of skin CA removed from posterior right shoulder is not warm, no redness, some discharge is noted locally, but not locally tender  Neurological: He is alert.  Skin: Skin is warm and dry.    ED Course  Procedures (including critical care time)  Labs Reviewed - No data to display Dg Chest 2 View  10/21/2011  *RADIOLOGY REPORT*  Clinical Data: Shortness of breath.  Right shoulder pain.  CHEST - 2 VIEW  Comparison: 10/18/2011 and chest CT dated 10/09/2003  Findings: The patient has chronic but progressive interstitial lung disease at both lung bases.  There is  a vague 9 mm area of ill-defined density in the left midzone.  This may represent a confluence of normal vascular structures but the possibility of a subtle nodule should be considered.  IT Is not visible on the prior exams.  No acute osseous abnormality.  Heart size and pulmonary vascularity are normal.  IMPRESSION:  1.  Probable chronic progressive interstitial disease primarily at the lung bases. 2.  Possible 9 mm nodule in the left midzone.  Original Report Authenticated By: Gwynn Burly, M.D.   Dg Shoulder Right  10/21/2011  *RADIOLOGY REPORT*  Clinical Data: Right shoulder pain.  RIGHT SHOULDER - 2+ VIEW  Comparison:  None.  Findings: There is no fracture, dislocation, soft tissue calcification or significant arthritis.  IMPRESSION: No acute abnormality of the right shoulder.  Original Report Authenticated By: Gwynn Burly, M.D.     No diagnosis found.    MDM   Patient was given a nebulizer treatment and he reports that the shortness of breath is much improved.  Pt is able to ambulate and holds his O2 sats on RA at 93-96%.  He felt fine walking.  Also norco given and shoulder pain is improved.  I reviewed CXR and shoulder Xray myself.  Pt is comfortable and family ok with d/c home with inhaler and instructions for close follow up.          Gavin Pound. Oletta Lamas, MD 10/21/11 2101

## 2011-10-21 NOTE — ED Notes (Signed)
Respiratory at bedside for treatment.  No distress noted.  Pt denies complaints at present time.

## 2011-10-24 ENCOUNTER — Other Ambulatory Visit (HOSPITAL_COMMUNITY): Payer: Self-pay | Admitting: Internal Medicine

## 2011-10-24 DIAGNOSIS — R9389 Abnormal findings on diagnostic imaging of other specified body structures: Secondary | ICD-10-CM

## 2011-10-24 DIAGNOSIS — J189 Pneumonia, unspecified organism: Secondary | ICD-10-CM

## 2011-10-29 ENCOUNTER — Ambulatory Visit (HOSPITAL_COMMUNITY)
Admission: RE | Admit: 2011-10-29 | Discharge: 2011-10-29 | Disposition: A | Discharge status: Home / Self Care | Payer: Medicare Other | Source: Ambulatory Visit | Attending: Internal Medicine | Admitting: Internal Medicine

## 2011-10-29 DIAGNOSIS — R9389 Abnormal findings on diagnostic imaging of other specified body structures: Secondary | ICD-10-CM

## 2011-10-29 DIAGNOSIS — R918 Other nonspecific abnormal finding of lung field: Secondary | ICD-10-CM | POA: Insufficient documentation

## 2011-10-29 DIAGNOSIS — J90 Pleural effusion, not elsewhere classified: Secondary | ICD-10-CM | POA: Insufficient documentation

## 2011-10-29 DIAGNOSIS — J189 Pneumonia, unspecified organism: Secondary | ICD-10-CM | POA: Insufficient documentation

## 2011-10-29 MED ORDER — IOHEXOL 300 MG/ML  SOLN
80.0000 mL | Freq: Once | INTRAMUSCULAR | Status: AC | PRN
  Administered 2011-10-29: 80 mL via INTRAVENOUS

## 2011-11-09 ENCOUNTER — Emergency Department (HOSPITAL_COMMUNITY): Payer: Medicare Other

## 2011-11-09 ENCOUNTER — Emergency Department (HOSPITAL_COMMUNITY)
Admission: EM | Admit: 2011-11-09 | Discharge: 2011-11-09 | Disposition: A | Discharge status: Home / Self Care | Payer: Medicare Other | Attending: Emergency Medicine | Admitting: Emergency Medicine

## 2011-11-09 ENCOUNTER — Encounter (HOSPITAL_COMMUNITY): Payer: Self-pay | Admitting: *Deleted

## 2011-11-09 DIAGNOSIS — E785 Hyperlipidemia, unspecified: Secondary | ICD-10-CM | POA: Insufficient documentation

## 2011-11-09 DIAGNOSIS — J90 Pleural effusion, not elsewhere classified: Secondary | ICD-10-CM | POA: Insufficient documentation

## 2011-11-09 DIAGNOSIS — I517 Cardiomegaly: Secondary | ICD-10-CM | POA: Insufficient documentation

## 2011-11-09 DIAGNOSIS — M549 Dorsalgia, unspecified: Secondary | ICD-10-CM | POA: Insufficient documentation

## 2011-11-09 DIAGNOSIS — K5900 Constipation, unspecified: Secondary | ICD-10-CM | POA: Insufficient documentation

## 2011-11-09 DIAGNOSIS — I10 Essential (primary) hypertension: Secondary | ICD-10-CM | POA: Insufficient documentation

## 2011-11-09 DIAGNOSIS — R109 Unspecified abdominal pain: Secondary | ICD-10-CM | POA: Insufficient documentation

## 2011-11-09 DIAGNOSIS — Z79899 Other long term (current) drug therapy: Secondary | ICD-10-CM | POA: Insufficient documentation

## 2011-11-09 LAB — URINALYSIS, ROUTINE W REFLEX MICROSCOPIC
Bilirubin Urine: NEGATIVE
Hgb urine dipstick: NEGATIVE
Ketones, ur: NEGATIVE mg/dL
Nitrite: NEGATIVE
Protein, ur: NEGATIVE mg/dL
Specific Gravity, Urine: 1.01 (ref 1.005–1.030)
Urobilinogen, UA: 1 mg/dL (ref 0.0–1.0)

## 2011-11-09 LAB — CBC
HCT: 31.4 % — ABNORMAL LOW (ref 39.0–52.0)
MCH: 36.9 pg — ABNORMAL HIGH (ref 26.0–34.0)
MCHC: 32.8 g/dL (ref 30.0–36.0)
RDW: 14.2 % (ref 11.5–15.5)

## 2011-11-09 LAB — BASIC METABOLIC PANEL
BUN: 14 mg/dL (ref 6–23)
Calcium: 9.9 mg/dL (ref 8.4–10.5)
GFR calc Af Amer: 58 mL/min — ABNORMAL LOW (ref >90)
GFR calc non Af Amer: 50 mL/min — ABNORMAL LOW (ref >90)
Glucose, Bld: 113 mg/dL — ABNORMAL HIGH (ref 70–99)
Potassium: 3.9 mEq/L (ref 3.5–5.1)

## 2011-11-09 LAB — DIFFERENTIAL
Basophils Absolute: 0 10*3/uL (ref 0.0–0.1)
Basophils Relative: 1 % (ref 0–1)
Eosinophils Absolute: 0.2 10*3/uL (ref 0.0–0.7)
Monocytes Absolute: 0.2 10*3/uL (ref 0.1–1.0)
Neutro Abs: 2.2 10*3/uL (ref 1.7–7.7)
Neutrophils Relative %: 55 % (ref 43–77)

## 2011-11-09 NOTE — ED Provider Notes (Signed)
This chart was scribed for Lee Melter, MD by Williemae Natter. The patient was seen in room [facility] at 2:46 PM.  CSN: [id number]  Arrival date & time 11/09/11  1142   First MD Initiated Contact with Patient 11/09/11 1435      Chief Complaint  Patient presents with  . Abdominal Pain    (Consider location/radiation/quality/duration/timing/severity/associated sxs/prior treatment) Patient is a 78 y.o. male presenting with abdominal pain. The history is provided by the patient and a relative.  Abdominal Pain The primary symptoms of the illness include abdominal pain. The current episode started more than 2 days ago. The onset of the illness was sudden. The problem has not changed since onset. The illness is associated with laxative use. The patient has had a change in bowel habit. Risk factors for an acute abdominal problem include being elderly and a history of abdominal surgery. Additional symptoms associated with the illness include constipation and back pain. Significant associated medical issues do not include PUD, GERD or diabetes.   Lee Ramirez is a 78 y.o. male who presents to the Emergency Department complaining of abdominal pain and decreased stool for 2 weeks. Iron and potassium was low on last visit to ED. Pt has been constipated and treated with Metamucil. Last bm was yesterday evening. Some dizziness and trouble walking. Pt lives with wife and daughter. Intermittent brief episodes of chest pain today PCP-Dr. Regino Schultze Past Medical History  Diagnosis Date  . ASCVD (arteriosclerotic cardiovascular disease)     Sizable non-Q. myocardial infarction in 09/2009; severe three-vessel disease with an ejection fraction of 35%; three-vessel DES complicated by thrombocytopenia and mild renal insufficiency  . Hyperlipidemia     09/2010: TC-151, TG-411, H.-32, L.-not calculated   . Anemia     H/H -12.3/37.2; MCV-103; platelets-114 (08/2010)   . Malignant neoplasm of prostate     s/p  radiation therapy complicated by proctitis  . Hypertension   . Orthostatic hypotension     Lightheaded; no syncope  . Thrombocytopenia, acquired     09/2010-114,000    Past Surgical History  Procedure Date  . Inguinal hernia repair     Right  . Appendectomy   . Coronary angioplasty with stent placement     History reviewed. No pertinent family history.  History  Substance Use Topics  . Smoking status: Never Smoker   . Smokeless tobacco: Never Used  . Alcohol Use: No      Review of Systems  Gastrointestinal: Positive for abdominal pain and constipation.  Musculoskeletal: Positive for back pain.  10 Systems reviewed and are negative for acute change except as noted in the HPI.   Allergies  Aspirin  Home Medications   Current Outpatient Rx  Name Route Sig Dispense Refill  . ALBUTEROL SULFATE HFA 108 (90 BASE) MCG/ACT IN AERS Inhalation Inhale 2 puffs into the lungs every 6 (six) hours as needed. For shortness of breath    . ALLOPURINOL 300 MG PO TABS Oral Take 300 mg by mouth daily.      Marland Kitchen CRANBERRY PO Oral Take 1 capsule by mouth daily.    . CYANOCOBALAMIN 100 MCG PO TABS Oral Take 100 mcg by mouth daily.      . IRON 28 MG PO TABS Oral Take 1 tablet by mouth daily.    Marland Kitchen FLUDROCORTISONE ACETATE 0.1 MG PO TABS Oral Take 0.1 mg by mouth daily.    Marland Kitchen HYDROCODONE-ACETAMINOPHEN 5-325 MG PO TABS  1-2 tablets po q 6 hours prn moderate  to severe pain 20 tablet 0  . MULTI-VITAMIN/MINERALS PO TABS Oral Take 1 tablet by mouth daily.     Marland Kitchen NITROGLYCERIN 0.4 MG SL SUBL Sublingual Place 1 tablet (0.4 mg total) under the tongue every 5 (five) minutes as needed. 25 tablet 10  . OMEPRAZOLE 20 MG PO CPDR Oral Take 20 mg by mouth daily.    Marland Kitchen ONDANSETRON HCL 4 MG PO TABS Oral Take 4 mg by mouth every 6 (six) hours.    Marland Kitchen PRAVASTATIN SODIUM 40 MG PO TABS Oral Take 40 mg by mouth at bedtime.     . PSYLLIUM 95 % PO PACK Oral Take 1 packet by mouth daily.    . TRAVOPROST (BAK FREE) 0.004 % OP  SOLN Both Eyes Place 1 drop into both eyes at bedtime.     Marland Kitchen ZOLPIDEM TARTRATE 10 MG PO TABS Oral Take 5 mg by mouth at bedtime as needed. For sleep    . ACETAMINOPHEN 500 MG PO TABS Oral Take 1,000 mg by mouth every 6 (six) hours as needed. Pain    . MECLIZINE HCL 12.5 MG PO TABS Oral Take 12.5 mg by mouth 3 (three) times daily as needed. Dizziness      BP 141/85  Pulse 83  Temp(Src) 97.9 F (36.6 C) (Oral)  Resp 18  Ht 6\' 2"  (1.88 m)  Wt 195 lb (88.451 kg)  BMI 25.04 kg/m2  SpO2 98%  Physical Exam  Nursing note and vitals reviewed. Constitutional: He is oriented to person, place, and time. He appears well-developed and well-nourished.  HENT:  Head: Normocephalic and atraumatic.  Neck: Normal range of motion. Neck supple.  Cardiovascular: Normal rate, regular rhythm and normal heart sounds.   Pulmonary/Chest: Effort normal. He has rales (rales bilaterally).       tender on left posterior chest  Abdominal: Soft. Bowel sounds are normal. There is tenderness (diffuse abdominal tenderness).       Left lumbar tenderness  Genitourinary:       Possible swollen bladder Penis and left testicle are normal. Right testicle is not palpable Rectal exam-No rectal mass, brown stool, normal sphincter tone. No impaction  Neurological: He is alert and oriented to person, place, and time.  Skin: Skin is warm and dry.  Psychiatric: He has a normal mood and affect. His behavior is normal. Judgment and thought content normal.    ED Course  Procedures (including critical care time) DIAGNOSTIC STUDIES: Oxygen Saturation is 98% on room air, normal by my interpretation.    6:18 PM Reevaluation with update and discussion. After initial assessment and treatment, an updated evaluation reveals  patient is eating, and comfortable. Repeat vital signs, normal.. Teaghan Formica L     COORDINATION OF CARE:  Medications  albuterol (PROAIR HFA) 108 (90 BASE) MCG/ACT inhaler (not administered)  ondansetron  (ZOFRAN) 4 MG tablet (not administered)  omeprazole (PRILOSEC) 20 MG capsule (not administered)  zolpidem (AMBIEN) 10 MG tablet (not administered)  CRANBERRY PO (not administered)  Ferrous Sulfate (IRON) 28 MG TABS (not administered)  psyllium (HYDROCIL/METAMUCIL) 95 % PACK (not administered)      Labs Reviewed  CBC - Abnormal; Notable for the following:    WBC 3.9 (*)    RBC 2.79 (*)    Hemoglobin 10.3 (*)    HCT 31.4 (*)    MCV 112.5 (*)    MCH 36.9 (*)    Platelets 83 (*)    All other components within normal limits  BASIC METABOLIC PANEL - Abnormal;  Notable for the following:    Glucose, Bld 113 (*)    GFR calc non Af Amer 50 (*)    GFR calc Af Amer 58 (*)    All other components within normal limits  URINALYSIS, ROUTINE W REFLEX MICROSCOPIC - Abnormal; Notable for the following:    Color, Urine AMBER (*) BIOCHEMICALS MAY BE AFFECTED BY COLOR   All other components within normal limits  DIFFERENTIAL  URINE CULTURE   Component     Latest Ref Rng 06/12/2011 10/18/2011 11/09/2011  WBC     4.0 - 10.5 K/uL 8.4 5.4 3.9 (L)  RBC     4.22 - 5.81 MIL/uL 3.11 (L) 2.69 (L) 2.79 (L)  HGB     13.0 - 17.0 g/dL 16.1 (L) 09.6 (L) 04.5 (L)  HCT     39.0 - 52.0 % 34.0 (L) 29.2 (L) 31.4 (L)  MCV     78.0 - 100.0 fL 109.3 (H) 108.6 (H) 112.5 (H)  MCH     26.0 - 34.0 pg 36.3 (H) 37.5 (H) 36.9 (H)  MCHC     30.0 - 36.0 g/dL 40.9 81.1 91.4  RDW     11.5 - 15.5 % 13.7 14.1 14.2  Platelets     150 - 400 K/uL 125 (L) 88 (L) 83 (L)      Dg Chest 2 View  11/09/2011  *RADIOLOGY REPORT*  Clinical Data: Chest pain.  Abdominal pain.  History of prostate cancer.  CHEST - 2 VIEW 11/09/2011:  Comparison: CT chest 10/29/2011, 10/08/2003 [facility]. Two-view chest x-ray 10/21/2011 [facility].  Findings: Cardiac silhouette enlarged but stable.  Thoracic aorta tortuous atherosclerotic, unchanged.  Pulmonary vascularity normal without evidence of interstitial pulmonary edema  currently. Persistent bilateral pleural effusions, stable to slightly decreased in size in the interval.  Associated passive atelectasis in the lower lobes, unchanged.  No new pulmonary parenchymal abnormalities.  Degenerative changes throughout the thoracic spine.  IMPRESSION: Bilateral pleural effusions, left greater than right, and associated passive atelectasis in the lower lobes, unchanged and minimally improved since the examination 3 weeks ago.  No new abnormalities.  Stable cardiomegaly without pulmonary edema.  Original Report Authenticated By: Arnell Sieving, M.D.   Dg Abd 2 Views  11/09/2011  *RADIOLOGY REPORT*  Clinical Data: Abdominal pain for 2 weeks, mid chest pain, history of prostate carcinoma  ABDOMEN - 2 VIEW  Comparison: CT abdomen pelvis of 09/29/2003  Findings: Supine and erect views the abdomen show no bowel obstruction.  No free air is seen.  No opaque calculi noted.  There are degenerative changes throughout the lumbar spine.  There is opacity at the left lung base most consistent with left effusion and possibly atelectasis.  IMPRESSION:  1.  No bowel obstruction.  No free air. 2.  Probable left pleural effusion and left basilar atelectasis.  Original Report Authenticated By: Juline Patch, M.D.     1. Abdominal pain   2. Constipation       MDM  Nonspecific pain, with reassuring. Evaluation ED. Hemoglobin is stable, low. Patient stable for discharge with outpatient management. Doubt Colitis, metabolic instability, vascular collapse, occult infection,      I personally performed the services described in this documentation, which was scribed in my presence. The recorded information has been reviewed and considered.          Lee Melter, MD 11/09/11 [phone]

## 2011-11-09 NOTE — ED Notes (Signed)
Family at bedside. Patient is stating he feels a little better but still want to be seen.

## 2011-11-09 NOTE — ED Notes (Signed)
Patient is not feeling good. States she spits up blood. Patient is comfortable at this time.

## 2011-11-09 NOTE — ED Notes (Signed)
Family at bedside. Patient states he is still hurting at this time

## 2011-11-09 NOTE — ED Notes (Signed)
abd pain for 2 weeks.  sm bm yesterday, problem with constipation.  No vomiting, Nausea for 2 weeks.  Recent pneumonia.

## 2011-11-09 NOTE — Discharge Instructions (Signed)
Get repeat of rest and drink a lot of fluids.  Use a stool softener twice a day for one week. See your PCP next week for a checkup. Return here if needed for problems.  Abdominal Pain Abdominal pain can be caused by many things. Your caregiver decides the seriousness of your pain by an examination and possibly blood tests and X-rays. Many cases can be observed and treated at home. Most abdominal pain is not caused by a disease and will probably improve without treatment. However, in many cases, more time must pass before a clear cause of the pain can be found. Before that point, it may not be known if you need more testing, or if hospitalization or surgery is needed. HOME CARE INSTRUCTIONS   Do not take laxatives unless directed by your caregiver.   Take pain medicine only as directed by your caregiver.   Only take over-the-counter or prescription medicines for pain, discomfort, or fever as directed by your caregiver.   Try a clear liquid diet (broth, tea, or water) for as long as directed by your caregiver. Slowly move to a bland diet as tolerated.  SEEK IMMEDIATE MEDICAL CARE IF:   The pain does not go away.   You have a fever.   You keep throwing up (vomiting).   The pain is felt only in portions of the abdomen. Pain in the right side could possibly be appendicitis. In an adult, pain in the left lower portion of the abdomen could be colitis or diverticulitis.   You pass bloody or black tarry stools.  MAKE SURE YOU:   Understand these instructions.   Will watch your condition.   Will get help right away if you are not doing well or get worse.  Document Released: 05/16/2005 Document Revised: 07/26/2011 Document Reviewed: 03/24/2008 [facility] Patient Information 2012 [place], [place].Constipation in Adults Constipation is having fewer than 2 bowel movements per week. Usually, the stools are hard. As we grow older, constipation is more common. If you try to fix constipation with  laxatives, the problem may get worse. This is because laxatives taken over a long period of time make the colon muscles weaker. A low-fiber diet, not taking in enough fluids, and taking some medicines may make these problems worse. MEDICATIONS THAT MAY CAUSE CONSTIPATION  Water pills (diuretics).   Calcium channel blockers (used to control blood pressure and for the heart).   Certain pain medicines (narcotics).   Anticholinergics.   Anti-inflammatory agents.   Antacids that contain aluminum.  DISEASES THAT CONTRIBUTE TO CONSTIPATION  Diabetes.   Parkinson's disease.   Dementia.   Stroke.   Depression.   Illnesses that cause problems with salt and water metabolism.  HOME CARE INSTRUCTIONS   Constipation is usually best cared for without medicines. Increasing dietary fiber and eating more fruits and vegetables is the best way to manage constipation.   Slowly increase fiber intake to 25 to 38 grams per day. Whole grains, fruits, vegetables, and legumes are good sources of fiber. A dietitian can further help you incorporate high-fiber foods into your diet.   Drink enough water and fluids to keep your urine clear or pale yellow.   A fiber supplement may be added to your diet if you cannot get enough fiber from foods.   Increasing your activities also helps improve regularity.   Suppositories, as suggested by your caregiver, will also help. If you are using antacids, such as aluminum or calcium containing products, it will be helpful to switch  to products containing magnesium if your caregiver says it is okay.   If you have been given a liquid injection (enema) today, this is only a temporary measure. It should not be relied on for treatment of longstanding (chronic) constipation.   Stronger measures, such as magnesium sulfate, should be avoided if possible. This may cause uncontrollable diarrhea. Using magnesium sulfate may not allow you time to make it to the bathroom.  SEEK  IMMEDIATE MEDICAL CARE IF:   There is bright red blood in the stool.   The constipation stays for more than 4 days.   There is belly (abdominal) or rectal pain.   You do not seem to be getting better.   You have any questions or concerns.  MAKE SURE YOU:   Understand these instructions.   Will watch your condition.   Will get help right away if you are not doing well or get worse.  Document Released: 05/04/2004 Document Revised: 07/26/2011 Document Reviewed: 07/10/2011 [facility] Patient Information 2012 [place], [place].

## 2011-11-11 LAB — URINE CULTURE
Colony Count: NO GROWTH
Culture: NO GROWTH

## 2011-11-15 ENCOUNTER — Encounter: Payer: Self-pay | Admitting: Cardiology

## 2011-11-15 ENCOUNTER — Ambulatory Visit (INDEPENDENT_AMBULATORY_CARE_PROVIDER_SITE_OTHER): Payer: Medicare Other | Admitting: Cardiology

## 2011-11-15 VITALS — BP 140/84

## 2011-11-15 DIAGNOSIS — D649 Anemia, unspecified: Secondary | ICD-10-CM

## 2011-11-15 DIAGNOSIS — G4700 Insomnia, unspecified: Secondary | ICD-10-CM | POA: Insufficient documentation

## 2011-11-15 DIAGNOSIS — D696 Thrombocytopenia, unspecified: Secondary | ICD-10-CM

## 2011-11-15 DIAGNOSIS — D6959 Other secondary thrombocytopenia: Secondary | ICD-10-CM

## 2011-11-15 DIAGNOSIS — E785 Hyperlipidemia, unspecified: Secondary | ICD-10-CM

## 2011-11-15 DIAGNOSIS — I10 Essential (primary) hypertension: Secondary | ICD-10-CM

## 2011-11-15 DIAGNOSIS — I2510 Atherosclerotic heart disease of native coronary artery without angina pectoris: Secondary | ICD-10-CM

## 2011-11-15 DIAGNOSIS — I951 Orthostatic hypotension: Secondary | ICD-10-CM

## 2011-11-15 NOTE — Assessment & Plan Note (Addendum)
Anemia persists.  The patient has never undergone screening colonoscopy.  He developed severe constipation, which may have resulted from use of iron or from narcotic analgesics.  Previous iron studies indicate possible iron deficiency, but ferritin was well within the normal range.  Platelets continue to decrease.  The presence of accompanying thrombocytopenia suggests a possible bone marrow disorder.  Referral to a hematologist and gastroenterologist may be necessary.

## 2011-11-15 NOTE — Patient Instructions (Signed)
Your physician recommends that you schedule a follow-up appointment in: 9 MONTHS  STOOLS X 3 FOR BLOOD AND RETURN TO OFFICE ASAP  USE AMBIEN AS INFREQUENTLY AS YOU POSSIBLY CAN.  STARTING WITH 2.5 MG, TAKING NO MORE THAN 5 MG  Your physician recommends that you return for lab work in: TODAY

## 2011-11-15 NOTE — Progress Notes (Signed)
Patient ID: Lee Ramirez, male   DOB: [birth year], 78 y.o.   MRN: [id number] HPI: Scheduled return visit for this delightful 78 year old gentleman with a history of coronary disease, most recently followed by me for orthostatic hypotension.  Since his last visit, he has required multiple visits to the emergency department.  On one occasion, he was diagnosed with pneumonia, but refused admission and was successfully treated as an outpatient.  6 days ago, he was seen for constipation.  A few days ago, he suffered a fall at home resulting in contusions, particularly over the left upper arm.  He does not believe that he lost consciousness and attributes the episode to use of Ambien.  He experiences occasional very brief episodes of lightheadedness, sometimes associated with assuming the standing position.  At his recent emergency department evaluation, orthostatic vital signs were excellent.  Prior to Admission medications   Medication Sig Start Date End Date Taking? Authorizing Provider  acetaminophen (TYLENOL) 500 MG tablet Take 1,000 mg by mouth every 6 (six) hours as needed. Pain   Yes Historical Provider, MD  albuterol (PROAIR HFA) 108 (90 BASE) MCG/ACT inhaler Inhale 2 puffs into the lungs every 6 (six) hours as needed. For shortness of breath   Yes Historical Provider, MD  allopurinol (ZYLOPRIM) 300 MG tablet Take 150 mg by mouth daily.    Yes Historical Provider, MD  CRANBERRY PO Take 1 capsule by mouth daily.   Yes Historical Provider, MD  cyanocobalamin 100 MCG tablet Take 100 mcg by mouth daily.     Yes Historical Provider, MD  Ferrous Sulfate (IRON) 28 MG TABS Take 1 tablet by mouth daily.   Yes Historical Provider, MD  fludrocortisone (FLORINEF) 0.1 MG tablet Take 0.1 mg by mouth daily.   Yes Historical Provider, MD  meclizine (ANTIVERT) 12.5 MG tablet Take 12.5 mg by mouth 3 (three) times daily as needed. Dizziness   Yes Historical Provider, MD  Multiple Vitamins-Minerals (MULTIVITAMIN WITH  MINERALS) tablet Take 1 tablet by mouth daily.    Yes Historical Provider, MD  nitroGLYCERIN (NITROSTAT) 0.4 MG SL tablet Place 1 tablet (0.4 mg total) under the tongue every 5 (five) minutes as needed. 12/29/10  Yes Tonny Bollman, MD  omeprazole (PRILOSEC) 20 MG capsule Take 20 mg by mouth daily.   Yes Historical Provider, MD  ondansetron (ZOFRAN) 4 MG tablet Take 4 mg by mouth every 6 (six) hours.   Yes Historical Provider, MD  pravastatin (PRAVACHOL) 40 MG tablet Take 40 mg by mouth at bedtime.  03/22/11  Yes Kathlen Brunswick, MD  Travoprost, BAK Free, (TRAVATAN) 0.004 % SOLN ophthalmic solution Place 1 drop into both eyes at bedtime.    Yes Historical Provider, MD  zolpidem (AMBIEN) 10 MG tablet Take 5 mg by mouth at bedtime as needed. For sleep   Yes Historical Provider, MD   Allergies  Allergen Reactions  . Aspirin Anaphylaxis     Past medical history, social history, and family history reviewed and updated.  ROS: Denies chest discomfort, dyspnea, orthopnea or PND.  He has noted mild ankle edema over the past few days.  Although the systems reviewed and are negative.  PHYSICAL EXAM: General-Well developed; no acute distress Body habitus-proportionate weight and height Neck-No JVD; no carotid bruits Lungs-clear lung fields; resonant to percussion Cardiovascular-normal PMI; normal S1 and accentuated S2; minimal systolic murmur Abdomen-normal bowel sounds; soft and non-tender without masses or organomegaly Musculoskeletal-No deformities, no cyanosis or clubbing Neurologic-Normal cranial nerves; symmetric strength and tone Skin-Warm,  no significant lesions Extremities-distal pulses intact; 1+ ankle edema  ASSESSMENT AND PLAN:  [facility], MD 11/15/2011 2:15 PM

## 2011-11-16 ENCOUNTER — Encounter: Payer: Self-pay | Admitting: Cardiology

## 2011-11-16 ENCOUNTER — Other Ambulatory Visit: Payer: Self-pay | Admitting: Cardiology

## 2011-11-16 LAB — IRON AND TIBC
%SAT: 23 % (ref 20–55)
Iron: 70 ug/dL (ref 42–165)
TIBC: 309 ug/dL (ref 215–435)
UIBC: 239 ug/dL (ref 125–400)

## 2011-11-16 LAB — CBC
Hemoglobin: 10.6 g/dL — ABNORMAL LOW (ref 13.0–17.0)
Platelets: 105 10*3/uL — ABNORMAL LOW (ref 150–400)
RBC: 2.97 MIL/uL — ABNORMAL LOW (ref 4.22–5.81)
WBC: 5.9 10*3/uL (ref 4.0–10.5)

## 2011-11-16 NOTE — Assessment & Plan Note (Signed)
Blood pressure control has been excellent over the past month.  Continued monitoring and continuation of current therapy are appropriate.

## 2011-11-16 NOTE — Assessment & Plan Note (Signed)
The patient currently has no symptoms to suggest recurrent myocardial ischemia.  We will continue to optimally control cardiovascular risk factors

## 2011-11-16 NOTE — Assessment & Plan Note (Signed)
No significant orthostatic change in blood pressure.

## 2011-11-16 NOTE — Assessment & Plan Note (Signed)
Lipid profile was excellent one year ago except for elevated triglycerides.  We will continue to monitor effectiveness of lipid lowering therapy.

## 2011-11-19 ENCOUNTER — Encounter: Payer: Self-pay | Admitting: *Deleted

## 2011-11-24 ENCOUNTER — Emergency Department (HOSPITAL_COMMUNITY): Payer: Medicare Other

## 2011-11-24 ENCOUNTER — Observation Stay (HOSPITAL_COMMUNITY)
Admission: EM | Admit: 2011-11-24 | Discharge: 2011-11-25 | Disposition: A | Discharge status: Home / Self Care | Payer: Medicare Other | Attending: Internal Medicine | Admitting: Internal Medicine

## 2011-11-24 ENCOUNTER — Encounter (HOSPITAL_COMMUNITY): Payer: Self-pay

## 2011-11-24 ENCOUNTER — Other Ambulatory Visit: Payer: Self-pay

## 2011-11-24 DIAGNOSIS — K219 Gastro-esophageal reflux disease without esophagitis: Secondary | ICD-10-CM

## 2011-11-24 DIAGNOSIS — D696 Thrombocytopenia, unspecified: Secondary | ICD-10-CM

## 2011-11-24 DIAGNOSIS — Z888 Allergy status to other drugs, medicaments and biological substances status: Secondary | ICD-10-CM | POA: Insufficient documentation

## 2011-11-24 DIAGNOSIS — I447 Left bundle-branch block, unspecified: Principal | ICD-10-CM

## 2011-11-24 DIAGNOSIS — E785 Hyperlipidemia, unspecified: Secondary | ICD-10-CM

## 2011-11-24 DIAGNOSIS — G4700 Insomnia, unspecified: Secondary | ICD-10-CM

## 2011-11-24 DIAGNOSIS — I951 Orthostatic hypotension: Secondary | ICD-10-CM

## 2011-11-24 DIAGNOSIS — Z79899 Other long term (current) drug therapy: Secondary | ICD-10-CM | POA: Insufficient documentation

## 2011-11-24 DIAGNOSIS — I10 Essential (primary) hypertension: Secondary | ICD-10-CM

## 2011-11-24 DIAGNOSIS — D539 Nutritional anemia, unspecified: Secondary | ICD-10-CM

## 2011-11-24 DIAGNOSIS — D649 Anemia, unspecified: Secondary | ICD-10-CM

## 2011-11-24 DIAGNOSIS — I2510 Atherosclerotic heart disease of native coronary artery without angina pectoris: Secondary | ICD-10-CM

## 2011-11-24 LAB — COMPREHENSIVE METABOLIC PANEL
ALT: 16 U/L (ref 0–53)
AST: 21 U/L (ref 0–37)
Albumin: 3.8 g/dL (ref 3.5–5.2)
Chloride: 105 mEq/L (ref 96–112)
Creatinine, Ser: 1.13 mg/dL (ref 0.50–1.35)
Potassium: 3.8 mEq/L (ref 3.5–5.1)
Sodium: 142 mEq/L (ref 135–145)
Total Bilirubin: 0.8 mg/dL (ref 0.3–1.2)

## 2011-11-24 LAB — DIFFERENTIAL
Basophils Absolute: 0.1 10*3/uL (ref 0.0–0.1)
Basophils Relative: 1 % (ref 0–1)
Monocytes Absolute: 0.3 10*3/uL (ref 0.1–1.0)
Neutro Abs: 1.8 10*3/uL (ref 1.7–7.7)
Neutrophils Relative %: 47 % (ref 43–77)

## 2011-11-24 LAB — PROTIME-INR
INR: 1.13 (ref 0.00–1.49)
Prothrombin Time: 14.7 seconds (ref 11.6–15.2)

## 2011-11-24 LAB — CARDIAC PANEL(CRET KIN+CKTOT+MB+TROPI)
CK, MB: 2.2 ng/mL (ref 0.3–4.0)
Troponin I: <0.3 ng/mL (ref <0.30)

## 2011-11-24 LAB — CBC
MCHC: 32.8 g/dL (ref 30.0–36.0)
Platelets: 84 10*3/uL — ABNORMAL LOW (ref 150–400)
RDW: 14.7 % (ref 11.5–15.5)

## 2011-11-24 MED ORDER — NITROGLYCERIN 0.4 MG SL SUBL
0.4000 mg | SUBLINGUAL_TABLET | SUBLINGUAL | Status: DC | PRN
  Filled 2011-11-24: qty 25

## 2011-11-24 MED ORDER — MORPHINE SULFATE 2 MG/ML IJ SOLN
2.0000 mg | INTRAMUSCULAR | Status: DC | PRN
  Administered 2011-11-24: 2 mg via INTRAVENOUS
  Filled 2011-11-24: qty 1

## 2011-11-24 NOTE — ED Notes (Signed)
Pt with onset of left chest pain 2 hours ago, took 2 ntg at home with minimal relief.  Pt denies sob, nausea,or radiation of pain, nad

## 2011-11-24 NOTE — H&P (Signed)
PCP:   [name], MD, MD   Cardiologist: Dr. Dietrich Pates  CODE STATUS: No CPR; no chemical or electrical methods to restart his heart. Wants all other resuscitative measures, including electrical pacing and mechanical ventilation.  Chief Complaint:  Chest pain since this evening  HPI: Lee Ramirez is an 78 y.o. male.  Caucasian gentleman lives at home caring for his demented wife; coronal artery disease status post 3 stents; orthostatic hypotension on Florinef. Before today has had no chest pain since his heart attack 2 years ago. At baseline has shortness of breath climbing stairs, and has to rest for a while when he gets to the top.  While at rest developed a sharp left-sided chest pain, moving down the left side of his chest, no other radiation no nausea vomiting dizziness or diaphoresis. 9/10 pain decreased to 5-6/10 after sublingual nitroglycerin but as it persisted for about 2 hours he eventually came to the emergency room. Pain is now completely relieved after a dose of morphine.  He admits to frequent GERD-like symptoms for which she frequent doses of TUMS.  No fever cough or cold; no black or bloody stool. He has lower extremity edema from the Florinef for his orthostatic hypotension; he ambulates without the assistance of a cane. He is unsure of his medications and the records are presumed from cardiology visit 9 days ago.  Suffers with severe insomnia, takes Ambien which helps but he usually takes it in the early morning for instance 4 AM causes him to be drowsy during the day.  Rewiew of Systems:  The patient denies anorexia, fever, weight loss,, vision loss, decreased hearing, hoarseness, syncope, balance deficits, hemoptysis, abdominal pain, melena, hematochezia, severe indigestion/heartburn, hematuria, incontinence, genital sores, muscle weakness, suspicious skin lesions, transient blindness, difficulty walking, depression, unusual weight change, abnormal bleeding,  enlarged lymph nodes, angioedema, and breast masses.    Past Medical History  Diagnosis Date  . ASCVD (arteriosclerotic cardiovascular disease)     Sizable non-Q. myocardial infarction in 09/2009; severe three-vessel disease with an ejection fraction of 35%; three-vessel DES complicated by thrombocytopenia and mild renal insufficiency  . Hyperlipidemia     09/2010: TC-151, TG-411, H.-32, L.-not calculated   . Anemia     H/H -12.3/37.2; MCV-103; platelets-114 (08/2010)   . 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 Hypertension   . Orthostatic hypotension     Lightheaded; no syncope  . Thrombocytopenia, acquired     09/2010-114,000    Past Surgical History  Procedure Date  . Inguinal hernia repair     Right  . Appendectomy   . Coronary angioplasty with stent placement     Medications:  HOME MEDS: Prior to Admission medications   Medication Sig Start Date End Date Taking? Authorizing Provider  acetaminophen (TYLENOL) 500 MG tablet Take 1,000 mg by mouth every 6 (six) hours as needed. Pain    Historical Provider, MD  albuterol (PROAIR HFA) 108 (90 BASE) MCG/ACT inhaler Inhale 2 puffs into the lungs every 6 (six) hours as needed. For shortness of breath    Historical Provider, MD  allopurinol (ZYLOPRIM) 300 MG tablet Take 150 mg by mouth daily.     Historical Provider, MD  CRANBERRY PO Take 1 capsule by mouth daily.    Historical Provider, MD  cyanocobalamin 100 MCG tablet Take 100 mcg by mouth daily.      Historical Provider, MD  Ferrous Sulfate (IRON) 28 MG TABS Take 1 tablet  by mouth daily.    Historical Provider, MD  fludrocortisone (FLORINEF) 0.1 MG tablet Take 0.1 mg by mouth daily.    Historical Provider, MD  meclizine (ANTIVERT) 12.5 MG tablet Take 12.5 mg by mouth 3 (three) times daily as needed. Dizziness    Historical Provider, MD  Multiple Vitamins-Minerals (MULTIVITAMIN WITH MINERALS) tablet Take 1 tablet by mouth daily.      Historical Provider, MD  nitroGLYCERIN (NITROSTAT) 0.4 MG SL tablet Place 1 tablet (0.4 mg total) under the tongue every 5 (five) minutes as needed. 12/29/10   Tonny Bollman, MD  omeprazole (PRILOSEC) 20 MG capsule Take 20 mg by mouth daily.    Historical Provider, MD  ondansetron (ZOFRAN) 4 MG tablet Take 4 mg by mouth every 6 (six) hours.    Historical Provider, MD  PRAVACHOL 40 MG tablet TAKE (1) TABLET BY MOUTH AT BEDTIME FOR CHOLESTEROL. 11/16/11   Kathlen Brunswick, MD  Travoprost, BAK Free, (TRAVATAN) 0.004 % SOLN ophthalmic solution Place 1 drop into both eyes at bedtime.     Historical Provider, MD  zolpidem (AMBIEN) 10 MG tablet Take 5 mg by mouth at bedtime as needed. For sleep    Historical Provider, MD     Allergies:  Allergies  Allergen Reactions  . Aspirin Anaphylaxis    Social History:   reports that he has never smoked. He has never used smokeless tobacco. He reports that he does not drink alcohol or use illicit drugs.  Family History: No family history on file.   Physical Exam: Filed Vitals:   11/24/11 2100  BP: 135/87  Pulse: 87  Resp: 21  SpO2: 96%   Blood pressure 135/87, pulse 87, resp. rate 21, SpO2 96.00%.  GEN:  Pleasant Caucasian lying in the stretcher in no acute distress; cooperative with exam PSYCH:  alert and oriented x4; does not appear anxious or depressed; affect is appropriate. HEENT: Mucous membranes pink and anicteric; PERRLA; EOM intact; stained, chalky translucent teeth Breasts:: Not examined CHEST WALL: No tenderness CHEST: Normal respiration, clear to auscultation bilaterally HEART: Regular rate and rhythm; no murmurs rubs or gallops BACK:kyphosis no scoliosis; no CVA tenderness ABDOMEN: Obese, soft non-tender; no masses, no organomegaly, normal abdominal bowel sounds; no intertriginous candida. Rectal Exam: Not done EXTREMITIES: age-appropriate arthropathy of the hands and knees; trace edema; no ulcerations. Genitalia: not  examined PULSES: 2+ and symmetric SKIN: Normal hydration; typical appearance of a solar keratosis on his face; no other rash or ulceration CNS: Cranial nerves 2-12 grossly intact no focal lateralizing neurologic deficit   Labs & Imaging Results for orders placed during the hospital encounter of 11/24/11 (from the past 48 hour(s))  CBC     Status: Abnormal   Collection Time   11/24/11  8:45 PM      Component Value Range Comment   WBC 3.9 (*) 4.0 - 10.5 (K/uL)    RBC 2.62 (*) 4.22 - 5.81 (MIL/uL)    Hemoglobin 9.5 (*) 13.0 - 17.0 (g/dL)    HCT 16.1 (*) 09.6 - 52.0 (%)    MCV 110.7 (*) 78.0 - 100.0 (fL)    MCH 36.3 (*) 26.0 - 34.0 (pg)    MCHC 32.8  30.0 - 36.0 (g/dL)    RDW 04.5  40.9 - 81.1 (%)    Platelets 84 (*) 150 - 400 (K/uL)   DIFFERENTIAL     Status: Normal   Collection Time   11/24/11  8:45 PM      Component Value Range Comment  Neutrophils Relative 47  43 - 77 (%)    Neutro Abs 1.8  1.7 - 7.7 (K/uL)    Lymphocytes Relative 40  12 - 46 (%)    Lymphs Abs 1.6  0.7 - 4.0 (K/uL)    Monocytes Relative 7  3 - 12 (%)    Monocytes Absolute 0.3  0.1 - 1.0 (K/uL)    Eosinophils Relative 5  0 - 5 (%)    Eosinophils Absolute 0.2  0.0 - 0.7 (K/uL)    Basophils Relative 1  0 - 1 (%)    Basophils Absolute 0.1  0.0 - 0.1 (K/uL)   COMPREHENSIVE METABOLIC PANEL     Status: Abnormal   Collection Time   11/24/11  8:45 PM      Component Value Range Comment   Sodium 142  135 - 145 (mEq/L)    Potassium 3.8  3.5 - 5.1 (mEq/L)    Chloride 105  96 - 112 (mEq/L)    CO2 28  19 - 32 (mEq/L)    Glucose, Bld 105 (*) 70 - 99 (mg/dL)    BUN 11  6 - 23 (mg/dL)    Creatinine, Ser 1.61  0.50 - 1.35 (mg/dL)    Calcium 09.6  8.4 - 10.5 (mg/dL)    Total Protein 6.4  6.0 - 8.3 (g/dL)    Albumin 3.8  3.5 - 5.2 (g/dL)    AST 21  0 - 37 (U/L)    ALT 16  0 - 53 (U/L)    Alkaline Phosphatase 39  39 - 117 (U/L)    Total Bilirubin 0.8  0.3 - 1.2 (mg/dL)    GFR calc non Af Amer 56 (*) >90 (mL/min)    GFR calc  Af Amer 65 (*) >90 (mL/min)   TROPONIN I     Status: Normal   Collection Time   11/24/11  8:45 PM      Component Value Range Comment   Troponin I <0.30  <0.30 (ng/mL)   PROTIME-INR     Status: Normal   Collection Time   11/24/11  8:45 PM      Component Value Range Comment   Prothrombin Time 14.7  11.6 - 15.2 (seconds)    INR 1.13  0.00 - 1.49     Dg Chest 2 View  11/24/2011  *RADIOLOGY REPORT*  Clinical Data: Chest pain.  CHEST - 2 VIEW  Comparison: 11/09/2011  Findings: Mild cardiomegaly noted with atherosclerotic calcification of the aortic arch.  Thoracic spondylosis is present.  Blunting of the posterior costophrenic angles noted compatible with small bilateral pleural effusions.  Interstitial accentuation favors the lung bases.  IMPRESSION:  1.  Similar appearance compared the prior exam, a small bilateral pleural effusions with passive atelectasis, and if interstitial accentuation in the lung bases potentially representing low-level interstitial edema.  Mild cardiomegaly is present. 2.  Atherosclerosis. 3.  Thoracic spondylosis.  Original Report Authenticated By: Dellia Cloud, M.D.      Assessment Present on Admission:   .Chest pain .ATHEROSCLEROTIC CARDIOVASCULAR DISEASE .Thrombocytopenia, acquired .LEFT BUNDLE BRANCH BLOCK .GERD (gastroesophageal reflux disease) .Hyperlipidemia .Orthostatic hypotension .Anemia, macrocytic .Insomnia .Hypertension   PLAN: We'll bring this gentleman in on observation to cycle his cardiac enzymes ruled out an acute coronary syndrome, though his pain is very atypical. We'll also give double dose PPI for GERD.  He has severe aspirin allergy, but is thrombocytopenic and his cardiologist have apparently elected not to put him on Plavix;  Will give low dose  beta blocker while in hospital because she is hypertensive.  Advise to take Ambien when necessary before 12mn.    Other plans as per orders.    Dayani Winbush 11/24/2011, 10:54  PM

## 2011-11-24 NOTE — ED Provider Notes (Signed)
History     CSN: [id number]  Arrival date & time 11/24/11  2000   First MD Initiated Contact with Patient 11/24/11 2015      Chief Complaint  Patient presents with  . Chest Pain    HPI Pt was seen at 2025.  Per pt, c/o gradual onset and persistence of constant left sided chest "pain" for the past 2 hours.  Pt states he took his own SL ntg with partial relief of pain.  Denies palpitations, no SOB/cough, no back pain, no N/V/D, no radiation of pain, no fevers.    Past Medical History  Diagnosis Date  . ASCVD (arteriosclerotic cardiovascular disease)     Sizable non-Q. myocardial infarction in 09/2009; severe three-vessel disease with an ejection fraction of 35%; three-vessel DES complicated by thrombocytopenia and mild renal insufficiency  . Hyperlipidemia     09/2010: TC-151, TG-411, H.-32, L.-not calculated   . Anemia     H/H -12.3/37.2; MCV-103; platelets-114 (08/2010)   . 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 Hypertension   . Orthostatic hypotension     Lightheaded; no syncope  . Thrombocytopenia, acquired     09/2010-114,000    Past Surgical History  Procedure Date  . Inguinal hernia repair     Right  . Appendectomy   . Coronary angioplasty with stent placement     History  Substance Use Topics  . Smoking status: Never Smoker   . Smokeless tobacco: Never Used  . Alcohol Use: No    Review of Systems ROS: Statement: All systems negative except as marked or noted in the HPI; Constitutional: Negative for fever and chills. ; ; Eyes: Negative for eye pain, redness and discharge. ; ; ENMT: Negative for ear pain, hoarseness, nasal congestion, sinus pressure and sore throat. ; ; Cardiovascular: Negative for palpitations, diaphoresis, dyspnea and peripheral edema. ; ; Respiratory: Negative for cough, wheezing and stridor. ; ; Gastrointestinal: Negative for nausea, vomiting, diarrhea, abdominal pain, blood in stool, hematemesis,  jaundice and rectal bleeding. . ; ; Genitourinary: Negative for dysuria, flank pain and hematuria. ; ; Musculoskeletal: Negative for back pain and neck pain. Negative for swelling and trauma.; ; Skin: Negative for pruritus, rash, abrasions, blisters, bruising and skin lesion.; ; Neuro: Negative for headache, lightheadedness and neck stiffness. Negative for weakness, altered level of consciousness , altered mental status, extremity weakness, paresthesias, involuntary movement, seizure and syncope.     Allergies  Aspirin  Home Medications   Current Outpatient Rx  Name Route Sig Dispense Refill  . ACETAMINOPHEN 500 MG PO TABS Oral Take 1,000 mg by mouth every 6 (six) hours as needed. Pain    . ALBUTEROL SULFATE HFA 108 (90 BASE) MCG/ACT IN AERS Inhalation Inhale 2 puffs into the lungs every 6 (six) hours as needed. For shortness of breath    . ALLOPURINOL 300 MG PO TABS Oral Take 150 mg by mouth daily.     Marland Kitchen CRANBERRY PO Oral Take 1 capsule by mouth daily.    . CYANOCOBALAMIN 100 MCG PO TABS Oral Take 100 mcg by mouth daily.      . IRON 28 MG PO TABS Oral Take 1 tablet by mouth daily.    Marland Kitchen FLUDROCORTISONE ACETATE 0.1 MG PO TABS Oral Take 0.1 mg by mouth daily.    Marland Kitchen MECLIZINE HCL 12.5 MG PO TABS Oral Take 12.5 mg by mouth 3 (three) times daily as needed. Dizziness    . MULTI-VITAMIN/MINERALS  PO TABS Oral Take 1 tablet by mouth daily.     Marland Kitchen NITROGLYCERIN 0.4 MG SL SUBL Sublingual Place 1 tablet (0.4 mg total) under the tongue every 5 (five) minutes as needed. 25 tablet 10  . OMEPRAZOLE 20 MG PO CPDR Oral Take 20 mg by mouth daily.    Marland Kitchen ONDANSETRON HCL 4 MG PO TABS Oral Take 4 mg by mouth every 6 (six) hours.    Marland Kitchen PRAVACHOL 40 MG PO TABS  TAKE (1) TABLET BY MOUTH AT BEDTIME FOR CHOLESTEROL. 30 each 6  . TRAVOPROST (BAK FREE) 0.004 % OP SOLN Both Eyes Place 1 drop into both eyes at bedtime.     Marland Kitchen ZOLPIDEM TARTRATE 10 MG PO TABS Oral Take 5 mg by mouth at bedtime as needed. For sleep      There  were no vitals taken for this visit.  Physical Exam 2030: Physical examination:  Nursing notes reviewed; Vital signs and O2 SAT reviewed;  Constitutional: Well developed, Well nourished, Well hydrated, In no acute distress; Head:  Normocephalic, atraumatic; Eyes: EOMI, PERRL, No scleral icterus; ENMT: Mouth and pharynx normal, Mucous membranes moist; Neck: Supple, Full range of motion, No lymphadenopathy; Cardiovascular: Regular rate and rhythm, No murmur, rub, or gallop; Respiratory: Breath sounds clear & equal bilaterally, No rales, rhonchi, wheezes, or rub, Normal respiratory effort/excursion; Chest: Nontender, Movement normal; Abdomen: Soft, Nontender, Nondistended, Normal bowel sounds; Extremities: Pulses normal, No tenderness, No edema, No calf edema or asymmetry.; Neuro: AA&Ox3, Major CN grossly intact.  No gross focal motor or sensory deficits in extremities.; Skin: Color normal, Warm, Dry.   ED Course  Procedures    MDM  MDM Reviewed: previous chart, nursing note and vitals Reviewed previous: ECG Interpretation: labs, ECG and x-ray     Date: 11/24/2011  Rate: 98  Rhythm: normal sinus rhythm  QRS Axis: left  Intervals: PR prolonged  ST/T Wave abnormalities: normal  Conduction Disutrbances:first-degree A-V block  and nonspecific intraventricular conduction delay  Narrative Interpretation:   Old EKG Reviewed: unchanged; no significant changes from previous EKG dated 10/18/2011.   Results for orders placed during the hospital encounter of 11/24/11  CBC      Component Value Range   WBC 3.9 (*) 4.0 - 10.5 (K/uL)   RBC 2.62 (*) 4.22 - 5.81 (MIL/uL)   Hemoglobin 9.5 (*) 13.0 - 17.0 (g/dL)   HCT 91.4 (*) 78.2 - 52.0 (%)   MCV 110.7 (*) 78.0 - 100.0 (fL)   MCH 36.3 (*) 26.0 - 34.0 (pg)   MCHC 32.8  30.0 - 36.0 (g/dL)   RDW 95.6  21.3 - 08.6 (%)   Platelets 84 (*) 150 - 400 (K/uL)  DIFFERENTIAL      Component Value Range   Neutrophils Relative 47  43 - 77 (%)   Neutro Abs  1.8  1.7 - 7.7 (K/uL)   Lymphocytes Relative 40  12 - 46 (%)   Lymphs Abs 1.6  0.7 - 4.0 (K/uL)   Monocytes Relative 7  3 - 12 (%)   Monocytes Absolute 0.3  0.1 - 1.0 (K/uL)   Eosinophils Relative 5  0 - 5 (%)   Eosinophils Absolute 0.2  0.0 - 0.7 (K/uL)   Basophils Relative 1  0 - 1 (%)   Basophils Absolute 0.1  0.0 - 0.1 (K/uL)  COMPREHENSIVE METABOLIC PANEL      Component Value Range   Sodium 142  135 - 145 (mEq/L)   Potassium 3.8  3.5 - 5.1 (mEq/L)  Chloride 105  96 - 112 (mEq/L)   CO2 28  19 - 32 (mEq/L)   Glucose, Bld 105 (*) 70 - 99 (mg/dL)   BUN 11  6 - 23 (mg/dL)   Creatinine, Ser 4.09  0.50 - 1.35 (mg/dL)   Calcium 81.1  8.4 - 10.5 (mg/dL)   Total Protein 6.4  6.0 - 8.3 (g/dL)   Albumin 3.8  3.5 - 5.2 (g/dL)   AST 21  0 - 37 (U/L)   ALT 16  0 - 53 (U/L)   Alkaline Phosphatase 39  39 - 117 (U/L)   Total Bilirubin 0.8  0.3 - 1.2 (mg/dL)   GFR calc non Af Amer 56 (*) >90 (mL/min)   GFR calc Af Amer 65 (*) >90 (mL/min)  TROPONIN I      Component Value Range   Troponin I <0.30  <0.30 (ng/mL)  PROTIME-INR      Component Value Range   Prothrombin Time 14.7  11.6 - 15.2 (seconds)   INR 1.13  0.00 - 1.49     Dg Chest 2 View 11/24/2011  *RADIOLOGY REPORT*  Clinical Data: Chest pain.  CHEST - 2 VIEW  Comparison: 11/09/2011  Findings: Mild cardiomegaly noted with atherosclerotic calcification of the aortic arch.  Thoracic spondylosis is present.  Blunting of the posterior costophrenic angles noted compatible with small bilateral pleural effusions.  Interstitial accentuation favors the lung bases.  IMPRESSION:  1.  Similar appearance compared the prior exam, a small bilateral pleural effusions with passive atelectasis, and if interstitial accentuation in the lung bases potentially representing low-level interstitial edema.  Mild cardiomegaly is present. 2.  Atherosclerosis. 3.  Thoracic spondylosis.  Original Report Authenticated By: Dellia Cloud, M.D.   Results for  ABDULAI, Lee Ramirez (MRN [id number]) as of 11/24/2011 22:58  Ref. Range 10/18/2011 12:25 11/09/2011 15:24 11/15/2011 14:53 11/24/2011 20:45  HGB Latest Range: 13.0-17.0 g/dL 21.3 (L) 08.6 (L) 57.8 (L) 9.5 (L)  HCT Latest Range: 39.0-52.0 % 29.2 (L) 31.4 (L) 33.8 (L) 29.0 (L)     9:59 PM:  Pt is allergic to ASA, given ntg and morphine with improvement in symptoms.  Dx testing d/w pt and family.  Questions answered.  Verb understanding, agreeable to admit.  T/C to [facility] Dr. Orvan Falconer, case discussed, including:  HPI, pertinent PM/SHx, VS/PE, dx testing, ED course and treatment:  Agreeable to admit, requests to obtain full cardiac panel now.            Laray Anger, DO 11/26/11 1358

## 2011-11-24 NOTE — ED Notes (Signed)
PATIENT STATES HE TOOK NITRO PTA AND DOESN'T WANT ANY NOW.

## 2011-11-25 ENCOUNTER — Encounter (HOSPITAL_COMMUNITY): Payer: Self-pay | Admitting: General Practice

## 2011-11-25 LAB — COMPREHENSIVE METABOLIC PANEL
Albumin: 3.7 g/dL (ref 3.5–5.2)
BUN: 11 mg/dL (ref 6–23)
Creatinine, Ser: 1.21 mg/dL (ref 0.50–1.35)
Potassium: 4 mEq/L (ref 3.5–5.1)
Total Protein: 5.9 g/dL — ABNORMAL LOW (ref 6.0–8.3)

## 2011-11-25 LAB — CBC
Hemoglobin: 10 g/dL — ABNORMAL LOW (ref 13.0–17.0)
MCH: 36.8 pg — ABNORMAL HIGH (ref 26.0–34.0)
MCHC: 33.2 g/dL (ref 30.0–36.0)
MCV: 110.7 fL — ABNORMAL HIGH (ref 78.0–100.0)

## 2011-11-25 LAB — TSH: TSH: 4.343 u[IU]/mL (ref 0.350–4.500)

## 2011-11-25 LAB — CARDIAC PANEL(CRET KIN+CKTOT+MB+TROPI): Troponin I: <0.3 ng/mL (ref <0.30)

## 2011-11-25 MED ORDER — FLUDROCORTISONE ACETATE 0.1 MG PO TABS
0.1000 mg | ORAL_TABLET | Freq: Every day | ORAL | Status: DC
  Filled 2011-11-25 (×2): qty 1

## 2011-11-25 MED ORDER — ZOLPIDEM TARTRATE 5 MG PO TABS: 5.0000 mg | ORAL_TABLET | Freq: Every evening | ORAL | Status: DC | PRN

## 2011-11-25 MED ORDER — SODIUM CHLORIDE 0.9 % IV SOLN: 250.0000 mL | INTRAVENOUS | Status: DC | PRN

## 2011-11-25 MED ORDER — ONDANSETRON HCL 4 MG PO TABS: 4.0000 mg | ORAL_TABLET | Freq: Four times a day (QID) | ORAL | Status: DC | PRN

## 2011-11-25 MED ORDER — ALBUTEROL SULFATE HFA 108 (90 BASE) MCG/ACT IN AERS: 2.0000 | INHALATION_SPRAY | Freq: Four times a day (QID) | RESPIRATORY_TRACT | Status: DC | PRN

## 2011-11-25 MED ORDER — ACETAMINOPHEN 500 MG PO TABS: 1000.0000 mg | ORAL_TABLET | Freq: Four times a day (QID) | ORAL | Status: DC | PRN

## 2011-11-25 MED ORDER — SODIUM CHLORIDE 0.9 % IJ SOLN
3.0000 mL | Freq: Two times a day (BID) | INTRAMUSCULAR | Status: DC
  Administered 2011-11-25: 3 mL via INTRAVENOUS
  Filled 2011-11-25: qty 3

## 2011-11-25 MED ORDER — ONDANSETRON HCL 4 MG/2ML IJ SOLN: 4.0000 mg | Freq: Four times a day (QID) | INTRAMUSCULAR | Status: DC | PRN

## 2011-11-25 MED ORDER — TRAZODONE HCL 50 MG PO TABS
25.0000 mg | ORAL_TABLET | Freq: Every evening | ORAL | Status: DC | PRN
  Filled 2011-11-25: qty 1

## 2011-11-25 MED ORDER — METOPROLOL TARTRATE 25 MG PO TABS
12.5000 mg | ORAL_TABLET | Freq: Two times a day (BID) | ORAL | Status: DC
  Administered 2011-11-25: 12.5 mg via ORAL
  Filled 2011-11-25 (×5): qty 1

## 2011-11-25 MED ORDER — MORPHINE SULFATE 2 MG/ML IJ SOLN: 2.0000 mg | INTRAMUSCULAR | Status: DC | PRN

## 2011-11-25 MED ORDER — VITAMIN B-12 100 MCG PO TABS
100.0000 ug | ORAL_TABLET | Freq: Every day | ORAL | Status: DC
  Filled 2011-11-25 (×2): qty 1

## 2011-11-25 MED ORDER — FLEET ENEMA 7-19 GM/118ML RE ENEM: 1.0000 | ENEMA | Freq: Once | RECTAL | Status: DC | PRN

## 2011-11-25 MED ORDER — TRAVOPROST (BAK FREE) 0.004 % OP SOLN
1.0000 [drp] | Freq: Every day | OPHTHALMIC | Status: DC
  Filled 2011-11-25: qty 2.5

## 2011-11-25 MED ORDER — ENOXAPARIN SODIUM 40 MG/0.4ML ~~LOC~~ SOLN
40.0000 mg | SUBCUTANEOUS | Status: DC
  Administered 2011-11-25: 40 mg via SUBCUTANEOUS
  Filled 2011-11-25: qty 0.4

## 2011-11-25 MED ORDER — BISACODYL 10 MG RE SUPP: 10.0000 mg | Freq: Every day | RECTAL | Status: DC | PRN

## 2011-11-25 MED ORDER — ALLOPURINOL 300 MG PO TABS
150.0000 mg | ORAL_TABLET | Freq: Every day | ORAL | Status: DC
  Filled 2011-11-25 (×2): qty 1

## 2011-11-25 MED ORDER — SIMVASTATIN 20 MG PO TABS: 20.0000 mg | ORAL_TABLET | Freq: Every day | ORAL | Status: DC

## 2011-11-25 MED ORDER — PANTOPRAZOLE SODIUM 40 MG PO TBEC
40.0000 mg | DELAYED_RELEASE_TABLET | Freq: Two times a day (BID) | ORAL | Status: DC
  Administered 2011-11-25: 40 mg via ORAL
  Filled 2011-11-25: qty 1

## 2011-11-25 NOTE — Discharge Summary (Signed)
Physician Discharge Summary  Patient ID: Lee Ramirez MRN: [id number] DOB/AGE: Oct 11, 1921 78 y.o.  Admit date: 11/24/2011 Discharge date: 11/25/2011  Discharge Diagnoses:   Chest pain  LEFT BUNDLE BRANCH BLOCK  ATHEROSCLEROTIC CARDIOVASCULAR DISEASE  Thrombocytopenia, acquired  Hyperlipidemia  Hypertension  Orthostatic hypotension  Anemia, macrocytic  Insomnia   GERD (gastroesophageal reflux disease)   Medication List  As of 11/25/2011  8:35 AM   TAKE these medications         acetaminophen 500 MG tablet   Commonly known as: TYLENOL   Take 1,000 mg by mouth every 6 (six) hours as needed. Pain      allopurinol 300 MG tablet   Commonly known as: ZYLOPRIM   Take 150 mg by mouth daily.      CRANBERRY PO   Take 1 capsule by mouth daily.      cyanocobalamin 100 MCG tablet   Take 100 mcg by mouth daily.      fludrocortisone 0.1 MG tablet   Commonly known as: FLORINEF   Take 0.1 mg by mouth daily.      Iron 28 MG Tabs   Take 1 tablet by mouth daily.      meclizine 12.5 MG tablet   Commonly known as: ANTIVERT   Take 12.5 mg by mouth 3 (three) times daily as needed. Dizziness      multivitamin with minerals tablet   Take 1 tablet by mouth daily.      nitroGLYCERIN 0.4 MG SL tablet   Commonly known as: NITROSTAT   Place 1 tablet (0.4 mg total) under the tongue every 5 (five) minutes as needed.      omeprazole 20 MG capsule   Commonly known as: PRILOSEC   Take 20 mg by mouth daily.      ondansetron 4 MG tablet   Commonly known as: ZOFRAN   Take 4 mg by mouth every 6 (six) hours.      PRAVACHOL 40 MG tablet   Generic drug: pravastatin   TAKE (1) TABLET BY MOUTH AT BEDTIME FOR CHOLESTEROL.      PROAIR HFA 108 (90 BASE) MCG/ACT inhaler   Generic drug: albuterol   Inhale 2 puffs into the lungs every 6 (six) hours as needed. For shortness of breath      Travoprost (BAK Free) 0.004 % Soln ophthalmic solution   Commonly known as: TRAVATAN   Place 1 drop into both  eyes at bedtime.      zolpidem 10 MG tablet   Commonly known as: AMBIEN   Take 5 mg by mouth at bedtime as needed. For sleep            Discharge Orders    Future Orders Please Complete By Expires   Diet - low sodium heart healthy      Activity as tolerated - No restrictions         Follow-up Information    Call [place] Bing, MD. (tomorrow)    Contact information:   [address] [place] [address] [phone]          Disposition: 01-Home or Self Care  Discharged Condition:  Stable  Consults:  none  Labs:   Results for orders placed during the hospital encounter of 11/24/11 (from the past 48 hour(s))  CBC     Status: Abnormal   Collection Time   11/24/11  8:45 PM      Component Value Range Comment   WBC 3.9 (*) 4.0 - 10.5 (  K/uL)    RBC 2.62 (*) 4.22 - 5.81 (MIL/uL)    Hemoglobin 9.5 (*) 13.0 - 17.0 (g/dL)    HCT 16.1 (*) 09.6 - 52.0 (%)    MCV 110.7 (*) 78.0 - 100.0 (fL)    MCH 36.3 (*) 26.0 - 34.0 (pg)    MCHC 32.8  30.0 - 36.0 (g/dL)    RDW 04.5  40.9 - 81.1 (%)    Platelets 84 (*) 150 - 400 (K/uL)   DIFFERENTIAL     Status: Normal   Collection Time   11/24/11  8:45 PM      Component Value Range Comment   Neutrophils Relative 47  43 - 77 (%)    Neutro Abs 1.8  1.7 - 7.7 (K/uL)    Lymphocytes Relative 40  12 - 46 (%)    Lymphs Abs 1.6  0.7 - 4.0 (K/uL)    Monocytes Relative 7  3 - 12 (%)    Monocytes Absolute 0.3  0.1 - 1.0 (K/uL)    Eosinophils Relative 5  0 - 5 (%)    Eosinophils Absolute 0.2  0.0 - 0.7 (K/uL)    Basophils Relative 1  0 - 1 (%)    Basophils Absolute 0.1  0.0 - 0.1 (K/uL)   COMPREHENSIVE METABOLIC PANEL     Status: Abnormal   Collection Time   11/24/11  8:45 PM      Component Value Range Comment   Sodium 142  135 - 145 (mEq/L)    Potassium 3.8  3.5 - 5.1 (mEq/L)    Chloride 105  96 - 112 (mEq/L)    CO2 28  19 - 32 (mEq/L)    Glucose, Bld 105 (*) 70 - 99 (mg/dL)    BUN 11  6 - 23 (mg/dL)    Creatinine, Ser  9.14  0.50 - 1.35 (mg/dL)    Calcium 78.2  8.4 - 10.5 (mg/dL)    Total Protein 6.4  6.0 - 8.3 (g/dL)    Albumin 3.8  3.5 - 5.2 (g/dL)    AST 21  0 - 37 (U/L)    ALT 16  0 - 53 (U/L)    Alkaline Phosphatase 39  39 - 117 (U/L)    Total Bilirubin 0.8  0.3 - 1.2 (mg/dL)    GFR calc non Af Amer 56 (*) >90 (mL/min)    GFR calc Af Amer 65 (*) >90 (mL/min)   TROPONIN I     Status: Normal   Collection Time   11/24/11  8:45 PM      Component Value Range Comment   Troponin I <0.30  <0.30 (ng/mL)   PROTIME-INR     Status: Normal   Collection Time   11/24/11  8:45 PM      Component Value Range Comment   Prothrombin Time 14.7  11.6 - 15.2 (seconds)    INR 1.13  0.00 - 1.49    CARDIAC PANEL(CRET KIN+CKTOT+MB+TROPI)     Status: Normal   Collection Time   11/24/11 10:13 PM      Component Value Range Comment   Total CK 57  7 - 232 (U/L)    CK, MB 2.2  0.3 - 4.0 (ng/mL)    Troponin I <0.30  <0.30 (ng/mL)    Relative Index RELATIVE INDEX IS INVALID  0.0 - 2.5    CARDIAC PANEL(CRET KIN+CKTOT+MB+TROPI)     Status: Normal   Collection Time   11/25/11  5:55 AM  Component Value Range Comment   Total CK 41  7 - 232 (U/L)    CK, MB 1.8  0.3 - 4.0 (ng/mL)    Troponin I <0.30  <0.30 (ng/mL)    Relative Index RELATIVE INDEX IS INVALID  0.0 - 2.5    CBC     Status: Abnormal   Collection Time   11/25/11  5:55 AM      Component Value Range Comment   WBC 4.1  4.0 - 10.5 (K/uL)    RBC 2.72 (*) 4.22 - 5.81 (MIL/uL)    Hemoglobin 10.0 (*) 13.0 - 17.0 (g/dL)    HCT 30.8 (*) 65.7 - 52.0 (%)    MCV 110.7 (*) 78.0 - 100.0 (fL)    MCH 36.8 (*) 26.0 - 34.0 (pg)    MCHC 33.2  30.0 - 36.0 (g/dL)    RDW 84.6  96.2 - 95.2 (%)    Platelets 80 (*) 150 - 400 (K/uL)   COMPREHENSIVE METABOLIC PANEL     Status: Abnormal   Collection Time   11/25/11  5:55 AM      Component Value Range Comment   Sodium 145  135 - 145 (mEq/L)    Potassium 4.0  3.5 - 5.1 (mEq/L)    Chloride 107  96 - 112 (mEq/L)    CO2 31  19 - 32 (mEq/L)      Glucose, Bld 99  70 - 99 (mg/dL)    BUN 11  6 - 23 (mg/dL)    Creatinine, Ser 8.41  0.50 - 1.35 (mg/dL)    Calcium 9.9  8.4 - 10.5 (mg/dL)    Total Protein 5.9 (*) 6.0 - 8.3 (g/dL)    Albumin 3.7  3.5 - 5.2 (g/dL)    AST 14  0 - 37 (U/L)    ALT 13  0 - 53 (U/L)    Alkaline Phosphatase 39  39 - 117 (U/L)    Total Bilirubin 1.0  0.3 - 1.2 (mg/dL)    GFR calc non Af Amer 52 (*) >90 (mL/min)    GFR calc Af Amer 60 (*) >90 (mL/min)     Diagnostics:  Dg Chest 2 View  11/24/2011  *RADIOLOGY REPORT*  Clinical Data: Chest pain.  CHEST - 2 VIEW  Comparison: 11/09/2011  Findings: Mild cardiomegaly noted with atherosclerotic calcification of the aortic arch.  Thoracic spondylosis is present.  Blunting of the posterior costophrenic angles noted compatible with small bilateral pleural effusions.  Interstitial accentuation favors the lung bases.  IMPRESSION:  1.  Similar appearance compared the prior exam, a small bilateral pleural effusions with passive atelectasis, and if interstitial accentuation in the lung bases potentially representing low-level interstitial edema.  Mild cardiomegaly is present. 2.  Atherosclerosis. 3.  Thoracic spondylosis.  Original Report Authenticated By: Dellia Cloud, M.D.   Dg Chest 2 View  11/09/2011  *RADIOLOGY REPORT*  Clinical Data: Chest pain.  Abdominal pain.  History of prostate cancer.  CHEST - 2 VIEW 11/09/2011:  Comparison: CT chest 10/29/2011, 10/08/2003 [facility]. Two-view chest x-ray 10/21/2011 [facility].  Findings: Cardiac silhouette enlarged but stable.  Thoracic aorta tortuous atherosclerotic, unchanged.  Pulmonary vascularity normal without evidence of interstitial pulmonary edema currently. Persistent bilateral pleural effusions, stable to slightly decreased in size in the interval.  Associated passive atelectasis in the lower lobes, unchanged.  No new pulmonary parenchymal abnormalities.  Degenerative changes throughout the thoracic  spine.  IMPRESSION: Bilateral pleural effusions, left greater than right, and associated  passive atelectasis in the lower lobes, unchanged and minimally improved since the examination 3 weeks ago.  No new abnormalities.  Stable cardiomegaly without pulmonary edema.  Original Report Authenticated By: Arnell Sieving, M.D.   Ct Chest W Contrast  10/29/2011  *RADIOLOGY REPORT*  Clinical Data: Pneumonia.  Prostate cancer 5 years ago.  Possible left upper lobe lung nodule.  CT CHEST WITH CONTRAST  Technique:  Multidetector CT imaging of the chest was performed following the standard protocol during bolus administration of intravenous contrast.  Contrast: 80mL OMNIPAQUE IOHEXOL 300 MG/ML IJ SOLN  Comparison: 10/21/2011 plain film.  Chest CT of 10/08/2003.  Findings: Lung windows demonstrate probable subpleural lymph node along the right minor fissure at 3 mm on image 36.  Mild bibasilar atelectasis. No pulmonary correlate for the questioned plain film abnormality at approximately the level of the carina. A left lower lobe lung nodule measures a 11 mm nodule on image 49. The nodule on image 46 of the prior exam is felt to be the same nodule (displaced more posteriorly on that exam secondary to lack of atelectasis and pleural fluid.) 3 mm more cephalad left lower lobe nodule on image 45 is indeterminate. 4 mm left lower lobe nodule on image 43 likely corresponds to a 4 mm nodule on image 39 of the prior exam.  Soft tissue windows demonstrate tortuous thoracic aorta.  Mild cardiomegaly with multivessel coronary artery atherosclerosis. Small bilateral pleural effusions are simple in appearance.  Pulmonary outflow tract enlarged, 3.4 cm.  Right paratracheal node measures 1.2 cm on image 21 versus 4 mm on the prior. Subcarinal/azygo-esophageal recess node measures 1.0 cm today versus 9 mm on the prior.  No hilar adenopathy.  Limited abdominal imaging demonstrates mild to moderate hepatic steatosis.  Incompletely imaged  adrenal glands.  Old granulomatous disease in the liver. No acute osseous abnormality.  IMPRESSION:  1.  No upper lobe/upper lung nodule to correspond to the plain film abnormality. 2.  Left lower lobe lung nodules, measuring up to 1.1 cm.  Felt to present on 10/08/2003 (consistent with benignity), but more anteriorly and superiorly positioned today secondary to volume loss and pleural fluid. 3.  Cardiomegaly with small bilateral pleural effusions.  Question fluid overload/CHF.  4.  Mild mediastinal adenopathy.  Most likely related to CHF. Consider follow-up with chest CT at 3 months. At this time, pulmonary nodules could also be reevaluated. 5. Pulmonary artery enlargement suggests pulmonary arterial hypertension. 6.  Hepatic steatosis.  Original Report Authenticated By: Consuello Bossier, M.D.   Dg Abd 2 Views  11/09/2011  *RADIOLOGY REPORT*  Clinical Data: Abdominal pain for 2 weeks, mid chest pain, history of prostate carcinoma  ABDOMEN - 2 VIEW  Comparison: CT abdomen pelvis of 09/29/2003  Findings: Supine and erect views the abdomen show no bowel obstruction.  No free air is seen.  No opaque calculi noted.  There are degenerative changes throughout the lumbar spine.  There is opacity at the left lung base most consistent with left effusion and possibly atelectasis.  IMPRESSION:  1.  No bowel obstruction.  No free air. 2.  Probable left pleural effusion and left basilar atelectasis.  Original Report Authenticated By: Juline Patch, M.D.    Procedures: none  EKG: NSR with old LBBB  Partial Code   Hospital Course: Mr. Venable is an 78 year old white male with a history of heart disease who presented with chest pain. It was left-sided. He took 2 sublingual nitroglycerin, without relief. He subsequently took  3 TUMSwithout relief so came to the emergency room. The pain was sharp. He has a history of acid reflux. He had no other symptoms. His EKG was unchanged. His physical examination was unremarkable. He  was observed overnight on telemetry. His EKG shows unchanged left bundle branch block. He's had no recurrence of the pain. Apparently it resolved after receiving nitroglycerin in the emergency room. He just saw Dr. Dietrich Pates recently, but I've asked the patient to call tomorrow (Monday).  His chest pain is atypical. It would be reasonable to do an outpatient stress test.  Discharge Exam:  Blood pressure 134/78, pulse 83, temperature 97.3 F (36.3 C), temperature source Oral, resp. rate 18, height 6\' 2"  (1.88 m), weight 83.915 kg (185 lb), SpO2 94.00%.  Lungs clear to auscultation bilaterally without wheeze rhonchi or rales Cardiovascular regular rate rhythm without murmurs gallops rubs Musculoskeletal no chest wall tenderness Abdomen soft nontender nondistended Extremities no clubbing cyanosis or edema   Signed: Robie Mcniel L 11/25/2011, 8:35 AM

## 2011-11-25 NOTE — Plan of Care (Signed)
Problem: Phase I Progression Outcomes Goal: Aspirin unless contraindicated Outcome: Not Applicable Date Met:  11/25/11 Patient allergic to aspirin

## 2011-11-29 NOTE — Progress Notes (Signed)
UR Chart Review Completed  

## 2011-12-03 ENCOUNTER — Encounter: Payer: Self-pay | Admitting: *Deleted

## 2011-12-10 ENCOUNTER — Encounter (INDEPENDENT_AMBULATORY_CARE_PROVIDER_SITE_OTHER): Payer: Medicare Other

## 2011-12-10 DIAGNOSIS — D539 Nutritional anemia, unspecified: Secondary | ICD-10-CM

## 2011-12-10 NOTE — Progress Notes (Signed)
Addended by: Demetrios Loll on: 12/10/2011 03:28 PM   Modules accepted: Orders

## 2011-12-17 ENCOUNTER — Encounter: Payer: Self-pay | Admitting: Cardiology

## 2011-12-21 ENCOUNTER — Other Ambulatory Visit: Payer: Self-pay | Admitting: Cardiology

## 2011-12-31 ENCOUNTER — Encounter (HOSPITAL_COMMUNITY): Payer: Self-pay

## 2011-12-31 ENCOUNTER — Encounter (HOSPITAL_COMMUNITY): Payer: Medicare Other | Attending: Hematology and Oncology

## 2011-12-31 ENCOUNTER — Other Ambulatory Visit (HOSPITAL_COMMUNITY): Payer: Self-pay | Admitting: Hematology and Oncology

## 2011-12-31 VITALS — BP 128/85 | HR 96 | Temp 97.8°F | Ht 72.0 in | Wt 193.0 lb

## 2011-12-31 DIAGNOSIS — K219 Gastro-esophageal reflux disease without esophagitis: Secondary | ICD-10-CM | POA: Insufficient documentation

## 2011-12-31 DIAGNOSIS — D539 Nutritional anemia, unspecified: Secondary | ICD-10-CM

## 2011-12-31 DIAGNOSIS — D649 Anemia, unspecified: Secondary | ICD-10-CM

## 2011-12-31 DIAGNOSIS — M109 Gout, unspecified: Secondary | ICD-10-CM | POA: Insufficient documentation

## 2011-12-31 DIAGNOSIS — D696 Thrombocytopenia, unspecified: Secondary | ICD-10-CM | POA: Insufficient documentation

## 2011-12-31 DIAGNOSIS — Z8546 Personal history of malignant neoplasm of prostate: Secondary | ICD-10-CM | POA: Insufficient documentation

## 2011-12-31 DIAGNOSIS — I10 Essential (primary) hypertension: Secondary | ICD-10-CM | POA: Insufficient documentation

## 2011-12-31 DIAGNOSIS — E538 Deficiency of other specified B group vitamins: Secondary | ICD-10-CM

## 2011-12-31 DIAGNOSIS — R109 Unspecified abdominal pain: Secondary | ICD-10-CM

## 2011-12-31 DIAGNOSIS — I7090 Unspecified atherosclerosis: Secondary | ICD-10-CM | POA: Insufficient documentation

## 2011-12-31 DIAGNOSIS — E785 Hyperlipidemia, unspecified: Secondary | ICD-10-CM | POA: Insufficient documentation

## 2011-12-31 LAB — COMPREHENSIVE METABOLIC PANEL
ALT: 12 U/L (ref 0–53)
Alkaline Phosphatase: 51 U/L (ref 39–117)
CO2: 29 mEq/L (ref 19–32)
GFR calc Af Amer: 59 mL/min — ABNORMAL LOW (ref >90)
GFR calc non Af Amer: 51 mL/min — ABNORMAL LOW (ref >90)
Glucose, Bld: 100 mg/dL — ABNORMAL HIGH (ref 70–99)
Potassium: 4 mEq/L (ref 3.5–5.1)
Sodium: 142 mEq/L (ref 135–145)

## 2011-12-31 LAB — LACTATE DEHYDROGENASE: LDH: 225 U/L (ref 94–250)

## 2011-12-31 NOTE — Patient Instructions (Addendum)
Lee Ramirez  [id number] Lee Ramirez  [id number] [birth year] Dr. Glenford Peers  [facility] Discharge Instructions  RECOMMENDATIONS MADE BY THE CONSULTANT AND ANY TEST RESULTS WILL BE SENT TO YOUR REFERRING DOCTOR.  We will do labs today.  We will schedule you for a ultrasound of abdomen  We will have you come back to see Jenita Seashore PA-C next week.  Will plan on a Bone Marrow in 2-3 weeks  EXAM FINDINGS BY MD TODAY AND SIGNS AND SYMPTOMS TO REPORT TO CLINIC OR PRIMARY MD:   Your current list of medications are: Current Outpatient Prescriptions  Medication Sig Dispense Refill  . acetaminophen (TYLENOL) 500 MG tablet Take 1,000 mg by mouth every 6 (six) hours as needed. Pain      . albuterol (PROAIR HFA) 108 (90 BASE) MCG/ACT inhaler Inhale 2 puffs into the lungs every 6 (six) hours as needed. For shortness of breath      . allopurinol (ZYLOPRIM) 300 MG tablet Take 150 mg by mouth daily.       Marland Kitchen CRANBERRY PO Take 1 capsule by mouth daily.      . cyanocobalamin 100 MCG tablet Take 100 mcg by mouth daily.        Marland Kitchen FLORINEF 0.1 MG tablet TAKE 1 TABLET BY MOUTH ONCE A DAY.  30 each  6  . Multiple Vitamins-Minerals (MULTIVITAMIN WITH MINERALS) tablet Take 1 tablet by mouth daily.       Marland Kitchen omeprazole (PRILOSEC) 20 MG capsule Take 20 mg by mouth daily.      . ondansetron (ZOFRAN) 4 MG tablet Take 4 mg by mouth every 6 (six) hours.      . potassium chloride (MICRO-K) 10 MEQ CR capsule Take 10 mEq by mouth daily.       Marland Kitchen PRAVACHOL 40 MG tablet TAKE (1) TABLET BY MOUTH AT BEDTIME FOR CHOLESTEROL.  30 each  6  . Travoprost, BAK Free, (TRAVATAN) 0.004 % SOLN ophthalmic solution Place 1 drop into both eyes at bedtime.       Marland Kitchen zolpidem (AMBIEN) 10 MG tablet Take 5 mg by mouth at bedtime as needed. For sleep      . Ferrous Sulfate (IRON) 28 MG TABS Take 1 tablet by mouth daily.      . meclizine (ANTIVERT) 12.5 MG tablet Take 12.5 mg by mouth 3 (three) times daily as needed.  Dizziness      . nitroGLYCERIN (NITROSTAT) 0.4 MG SL tablet Place 1 tablet (0.4 mg total) under the tongue every 5 (five) minutes as needed.  25 tablet  10     INSTRUCTIONS GIVEN AND DISCUSSED:   SPECIAL INSTRUCTIONS/FOLLOW-UP:  See above.  I acknowledge that I have been informed and understand all the instructions given to me and received a copy. I do not have any more questions at this time, but understand that I may call the [facility] at [phone] during business hours should I have any further questions or need assistance in obtaining follow-up care.

## 2011-12-31 NOTE — Progress Notes (Signed)
This office note has been dictated.

## 2011-12-31 NOTE — Progress Notes (Signed)
CC:   Lee Ramirez, M.D.  REFERRING PHYSICIAN:  [name], M.D.  IDENTIFYING STATEMENT:  The patient is an 78 year old man seen at request of Dr. Regino Ramirez with thrombocytopenia.  HISTORY OF PRESENT ILLNESS:  During a recent hospital admission, the patient was noted to be thrombocytopenic.  He was felt to have an acquired thrombocytopenia and on discharge, his levels were 87,000.  The patient himself reports no history of bleeding.  He does report easy bruisability.  He takes aspirin.  He also has a history of anemia.  Was recently placed on oral iron.  Was recently discontinued due to constipation and GI discomfort.  His last colonoscopy was years ago.  He notes abdominal discomfort periodically without nausea or vomiting.  He has never had an endoscopy.  He admits to frequent GERD-like symptoms. He uses Tums.  He denies fever, chills or night sweats.  He was also told that he had low-ish B12 levels for which he takes oral B12.  He has takes allopurinol daily for gout.  Lab results from Dr. Edison Ramirez office on '12/19/2011 notes a white cell count of 5, hemoglobin 10.3, hematocrit 33.2, platelets 92 (114,000 February 2013).  B12 of 310, iron 52, ferritin 97, folate greater than 20.  TIBC 284.  PAST MEDICAL HISTORY: 1. Arteriosclerotic vascular disease. 2. Hyperlipidemia. 3. History of anemia and thrombocytopenia. 4. Prostate cancer, status post radiation therapy 5 years ago. 5. GERD. 6. Hypertension. 7. History of hepatic steatosis. 8. Status post inguinal hernia repair. 9. Status post appendectomy. 10.Coronary angioplasty with stent. 11.History of left bundle branch block.  ALLERGIES:  Aspirin.  MEDICATIONS:  Florinef 0.1 mg, Tylenol 500 mg q.6 p.r.n. pain, albuterol 2 puffs q.6 , allopurinol 200 mg daily, iron 28 mg daily, Antivert 12.5 mg 3 times daily as needed, multivitamins, nitroglycerin 0.4 mg p.r.n., Prilosec 20 mg daily, Pravachol 40 mg q.h.s., Travatan  eye drops 1 drop q.h.s., Ambien 10 mg p.r.n.  SOCIAL HISTORY:  The patient is married with 2 children.  His wife is a patient here at the [facility].  He denies alcohol or tobacco use. He is retired from the [employer].  FAMILY HISTORY:  Negative for oncologic or hematologic malignancies.  REVIEW OF SYSTEMS:  Denies fever, chills, night sweats.  Admits to intermittent abdominal discomfort, made worse after eating.  GI:  Denies nausea, vomiting, diarrhea.  He is prone to constipation and takes laxatives.  Denies weight loss.  Cardiovascular:  Denies chest pain, PND, orthopnea, ankle swelling.  Respirations:  Denies cough, hemoptysis, wheeze, shortness of breath.  Skin:  Bruises easily but denies petechiae.  Lymph nodes:  No swelling.  Denies hearing loss. CNS:  Denies headaches, vision changes, extremity weakness.  PHYSICAL EXAMINATION:  The patient is an older man in no distress. Vitals:  Pulse 96, blood pressure 128/85, temperature 97.8, respirations 18, weight 193 pounds.  HEENT:  Head is atraumatic, normocephalic. Sclerae anicteric.  Mouth moist.  Neck:  Supple.  Chest:  Clear to percussion and auscultation.  CVS:  1st and 2nd heart sounds present. Abdomen:  Obese, soft, nontender.  Bowel sounds present.  No palpable masses.  Spleen and liver not palpable.  Extremities:  Trace edema. Pulses present and symmetrical.  Lymph nodes:  No palpable cervical, axillary or inguinal adenopathy.  CNS nonfocal.  IMPRESSION/PLAN:  Lee Ramirez is an 78 year old gentleman with anemia and thrombocytopenia.  He has 2 lines that appear to be infected and a bone marrow effect should be considered and  ruled out.  We also spent time discussing the diagnosis of anemia and thrombocytopenia.  Dr. Edison Ramirez office labs note a low normal B12 level.  He may benefit from a few doses of subcu B12.  His ferritin levels are 97 so it appears that iron deficiency is not is not the sole cause of anemia.   He may have underlying anemia of chronic disease.  So with this said, we will have the patient repeat iron studies with B12 level, check an erythropoietin level.  We will rule out hemolysis with Coombs and haptoglobin and LDH. We will also rule out myelodyscrasia with an SPEP and IFE.  We will review a smear.  With ongoing abdominal pain, we will have the patient obtain an ultrasound of the abdomen specifically focusing on the spleen to assess size.  We should also consider an upper endoscopy if workup shows no etiology for abdominal discomfort.  The patient, who is here with his daughter, agree with the recommendations and plan.  They will return to the lab for blood work and scheduled ultrasound.  We will schedule bone marrow if needed in about 2-3 weeks' time pending blood results.  He follows up in a week to 2 weeks' time to discuss results.    ______________________________ Lee Ramirez, M.D. LIO/MEDQ  D:  12/31/2011  T:  12/31/2011  Job:  [id number]

## 2011-12-31 NOTE — Progress Notes (Signed)
Lee Ramirez presented for labwork. Labs per MD order drawn via Peripheral Line 25 gauge needle inserted in rt ac.   Procedure without incident.  Needle removed intact. Patient tolerated procedure well.

## 2012-01-02 ENCOUNTER — Other Ambulatory Visit (HOSPITAL_COMMUNITY): Payer: Self-pay | Admitting: Oncology

## 2012-01-02 ENCOUNTER — Ambulatory Visit (HOSPITAL_COMMUNITY)
Admission: RE | Admit: 2012-01-02 | Discharge: 2012-01-02 | Disposition: A | Discharge status: Home / Self Care | Payer: Medicare Other | Source: Ambulatory Visit | Attending: Hematology and Oncology | Admitting: Hematology and Oncology

## 2012-01-02 DIAGNOSIS — J90 Pleural effusion, not elsewhere classified: Secondary | ICD-10-CM

## 2012-01-02 DIAGNOSIS — D539 Nutritional anemia, unspecified: Secondary | ICD-10-CM

## 2012-01-02 DIAGNOSIS — R109 Unspecified abdominal pain: Secondary | ICD-10-CM | POA: Insufficient documentation

## 2012-01-02 DIAGNOSIS — D696 Thrombocytopenia, unspecified: Secondary | ICD-10-CM

## 2012-01-02 LAB — PROTEIN ELECTROPHORESIS, SERUM
Gamma Globulin: 13.5 % (ref 11.1–18.8)
M-Spike, %: NOT DETECTED g/dL

## 2012-01-02 LAB — IMMUNOFIXATION ELECTROPHORESIS: Total Protein ELP: 6 g/dL (ref 6.0–8.3)

## 2012-01-03 ENCOUNTER — Ambulatory Visit (HOSPITAL_COMMUNITY)
Admission: RE | Admit: 2012-01-03 | Discharge: 2012-01-03 | Disposition: A | Discharge status: Home / Self Care | Payer: Medicare Other | Source: Ambulatory Visit | Attending: Oncology | Admitting: Oncology

## 2012-01-03 DIAGNOSIS — R0989 Other specified symptoms and signs involving the circulatory and respiratory systems: Secondary | ICD-10-CM | POA: Insufficient documentation

## 2012-01-03 DIAGNOSIS — I517 Cardiomegaly: Secondary | ICD-10-CM | POA: Insufficient documentation

## 2012-01-03 DIAGNOSIS — J90 Pleural effusion, not elsewhere classified: Secondary | ICD-10-CM | POA: Insufficient documentation

## 2012-01-03 LAB — HAPTOGLOBIN: Haptoglobin: 85 mg/dL (ref 45–215)

## 2012-01-07 ENCOUNTER — Encounter (HOSPITAL_BASED_OUTPATIENT_CLINIC_OR_DEPARTMENT_OTHER): Payer: Medicare Other | Admitting: Oncology

## 2012-01-07 VITALS — BP 120/74 | HR 91 | Temp 97.8°F | Wt 193.7 lb

## 2012-01-07 DIAGNOSIS — D649 Anemia, unspecified: Secondary | ICD-10-CM

## 2012-01-07 DIAGNOSIS — D696 Thrombocytopenia, unspecified: Secondary | ICD-10-CM

## 2012-01-07 DIAGNOSIS — E538 Deficiency of other specified B group vitamins: Secondary | ICD-10-CM

## 2012-01-07 DIAGNOSIS — R609 Edema, unspecified: Secondary | ICD-10-CM

## 2012-01-07 DIAGNOSIS — D539 Nutritional anemia, unspecified: Secondary | ICD-10-CM

## 2012-01-07 DIAGNOSIS — R600 Localized edema: Secondary | ICD-10-CM

## 2012-01-07 LAB — DIFFERENTIAL
Basophils Absolute: 0 10*3/uL (ref 0.0–0.1)
Basophils Relative: 1 % (ref 0–1)
Eosinophils Absolute: 0.2 10*3/uL (ref 0.0–0.7)
Lymphocytes Relative: 35 % (ref 12–46)
Neutrophils Relative %: 54 % (ref 43–77)

## 2012-01-07 LAB — CBC
Hemoglobin: 9.7 g/dL — ABNORMAL LOW (ref 13.0–17.0)
MCHC: 32.3 g/dL (ref 30.0–36.0)
Platelets: 78 10*3/uL — ABNORMAL LOW (ref 150–400)
RBC: 2.71 MIL/uL — ABNORMAL LOW (ref 4.22–5.81)

## 2012-01-07 LAB — RETICULOCYTES: Retic Ct Pct: 1.4 % (ref 0.4–3.1)

## 2012-01-07 MED ORDER — CYANOCOBALAMIN 1000 MCG/ML IJ SOLN
INTRAMUSCULAR | Status: AC
  Filled 2012-01-07: qty 1

## 2012-01-07 MED ORDER — CYANOCOBALAMIN 1000 MCG/ML IJ SOLN
1000.0000 ug | Freq: Once | INTRAMUSCULAR | Status: AC
  Administered 2012-01-07: 1000 ug via INTRAMUSCULAR

## 2012-01-07 NOTE — Patient Instructions (Signed)
Lee Ramirez  [id number] 1921/10/23 Dr. Glenford Peers   [facility]  Discharge Instructions  RECOMMENDATIONS MADE BY THE CONSULTANT AND ANY TEST RESULTS WILL BE SENT TO YOUR REFERRING DOCTOR.   EXAM FINDINGS BY MD TODAY AND SIGNS AND SYMPTOMS TO REPORT TO CLINIC OR PRIMARY MD: Exam and discussion per PA and MD.  We will draw some labwork today and start you on B12 injections.  You will get them 2 times weekly for 2 weeks, then 1 weekly for 2 weeks. We will then check labs and have you see Dr. Mariel Sleet in follow-up to see if things have improved.  MEDICATIONS PRESCRIBED: none   INSTRUCTIONS GIVEN AND DISCUSSED: Other :  Report increased fatigue or shortness of breath.  SPECIAL INSTRUCTIONS/FOLLOW-UP: Lab work Needed today and in 5 weeks and Return to Clinic for B12 injections as scheduled and to see Dr. Mariel Sleet in 5 1/2 weeks.   I acknowledge that I have been informed and understand all the instructions given to me and received a copy. I do not have any more questions at this time, but understand that I may call the [facility]ty [facility] at [facility] at [phone] during business hours should I have any further questions or need assistance in obtaining follow-up care.    __________________________________________  _____________  __________ Signature of Patient or Authorized Representative            Date                   Time    __________________________________________ Nurse's Signature

## 2012-01-07 NOTE — Progress Notes (Signed)
Lee Ramirez presents today for injection per MD orders. B12 1000 mg administered SQ in right Upper Arm. Administration without incident. Patient tolerated well.

## 2012-01-07 NOTE — Progress Notes (Signed)
Lee Ramirez presented for labwork. Labs per MD order drawn via Peripheral Line 23 gauge needle inserted in right AC  Good blood return present. Procedure without incident.  Needle removed intact. Patient tolerated procedure well.

## 2012-01-07 NOTE — Progress Notes (Signed)
Lee Ruths, MD, MD [address] [place] [address]  1. Thrombocytopenia, acquired  CBC, Differential, Reticulocytes, Vitamin B12, Folate, Methylmalonic acid, serum, CBC, Differential, Reticulocytes, Vitamin B12, Folate, Methylmalonic acid, serum, cyanocobalamin ((VITAMIN B-12)) injection 1,000 mcg  2. Anemia, macrocytic  CBC, Differential, Reticulocytes, Vitamin B12, Folate, Methylmalonic acid, serum, CBC, Differential, Reticulocytes, Vitamin B12, Folate, Methylmalonic acid, serum, cyanocobalamin ((VITAMIN B-12)) injection 1,000 mcg  3. Bilateral leg edema  2D Echocardiogram without contrast    INTERVAL HISTORY: Lee Ramirez 78 y.o. male returns for  regular  visit for followup of anemia, and thrombocytopenia with occasions of leukopenia.   Lee Ramirez is here for followup following seeing my colleague Dr. Dalene Ramirez for anemia and thrombocytopenia. A review of laboratory work and radiographic studies performed by Dr.we'll reveals an unremarkable workup thus far. Reviewing his laboratory work from 2011, it should be noted that he had a thrombocytopenia then within anemia. However his MCV has slowly been increasing over the past 2 years. He is now noted to have a macrocytic anemia.  I personally reviewed and went over laboratory results with the patient.  After reviewing all the laboratory work and the workup performed by Dr. Dalene Ramirez, the patient's picture is worrisome for an MDS-like syndrome. We spent some time discussing the role of a bone marrow biopsy and aspiration. He understands the risks and side effects associated with this. We recommended he undergo a bone marrow aspiration and biopsy. We've also offered him 4 weeks Worth a B12 injections with the hopes that this will correct his abnormalities. We did explain to the patient that this is unlikely, but we would be more in happy to try this initially. The patient has agreed with this plan and would like to  pursue B12 injections. We'll treat him aggressively with 1000 mcg of B12 twice a week for 2 weeks followed by weekly B12 injections for an additional 2 weeks. After 4 weeks worth of injections, but this week we'll perform laboratory work to see if his counts have responded to this intervention. If the failure is appreciated, the patient will then undergo bone marrow aspiration and biopsy. We spent some time discussing potential diagnosis from a bone marrow aspiration and biopsy including preleukemia, MDS, potentially treatable diseases, and potentially treatable diseases in light of his ECoG performance status and his age of 78 years old. The patient understands the above mentioned discussion.  In order to complete the hematologic workup, we will perform some laboratory work today including a CBC with differential, reticulocyte count, methylmalonic acid, and B12 level.  The patient reports bilateral lower extremity edema. This is appreciated on physical exam. He had a echocardiogram last year. We'll repeat that to evaluate for any cardiac origin of the lower extremity any edema.  He reports that the edema is much improved in the morning hours and worsens as the day progresses on. He does keep his lower tremor he is elevated as much as possible.  The patient is accompanied by his daughter, Lee Ramirez.  The patient reports that over the past 2 years his energy level has continued to dwindle. He's really been fighting this fatigue for quite some time. Initially he blamed it on his cardiac issues but he was eventually referred as mentioned above to the [facility] for further evaluation of his laboratory work.  The patient explains that he spends between 60-75% of the day in a relapse position in a recliner, couch, or bed. He and  his wife go out to dinner most the time and for lunch they have sandwiches that he prepares. Otherwise, the patient does very little activity throughout the  day.     Past Medical History  Diagnosis Date  . ASCVD (arteriosclerotic cardiovascular disease)     Sizable non-Q. myocardial infarction in 09/2009; severe three-vessel disease with an ejection fraction of 35%; three-vessel DES complicated by thrombocytopenia and mild renal insufficiency  . Hyperlipidemia     09/2010: TC-151, TG-411, H.-32, L.-not calculated   . Anemia     H/H -12.3/37.2; MCV-103; platelets-114 (08/2010)   . 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 Hypertension   . Orthostatic hypotension     Lightheaded; no syncope  . Thrombocytopenia, acquired     09/2010-114,000  . Hepatic steatosis 2013  . Thrombocytopenia     referred to hem-onc 5/13    has LEFT BUNDLE BRANCH BLOCK; ATHEROSCLEROTIC CARDIOVASCULAR DISEASE; Thrombocytopenia, acquired; Hyperlipidemia; Hypertension; Orthostatic hypotension; Anemia, macrocytic; Insomnia; Chest pain; and GERD (gastroesophageal reflux disease) on his problem list.     is allergic to aspirin.  We administered cyanocobalamin.  Past Surgical History  Procedure Date  . Inguinal hernia repair     Right  . Appendectomy   . Coronary angioplasty with stent placement     Denies any headaches, dizziness, double vision, fevers, chills, night sweats, nausea, vomiting, diarrhea, constipation, chest pain, heart palpitations, shortness of breath, blood in stool, black tarry stool, urinary pain, urinary burning, urinary frequency, hematuria.   PHYSICAL EXAMINATION  ECOG PERFORMANCE STATUS: 3 - Symptomatic, >50% confined to bed  Filed Vitals:   01/07/12 1449  BP: 120/74  Pulse: 91  Temp: 97.8 F (36.6 C)    GENERAL:alert, well nourished, well developed, comfortable, cooperative and smiling SKIN: skin color, texture, turgor are normal, no rashes or significant lesions HEAD: Normocephalic, No masses, lesions, tenderness or abnormalities EYES: normal, Conjunctiva are pink and  non-injected EARS: External ears normal OROPHARYNX:lips, buccal mucosa, and tongue normal and mucous membranes are moist  NECK: supple, no adenopathy, trachea midline LYMPH:  no palpable lymphadenopathy BREAST:not examined LUNGS: clear to auscultation  HEART: regular rate & rhythm, no murmurs, no gallops, S1 normal and S2 normal ABDOMEN:abdomen soft, non-tender and normal bowel sounds BACK: Back symmetric, no curvature., No CVA tenderness EXTREMITIES:less then 2 second capillary refill, no joint deformities, effusion, or inflammation, no skin discoloration, no clubbing, no cyanosis, positive findings:  edema 2+ LE pitting edema.  NEURO: alert & oriented x 3 with fluent speech, no focal motor/sensory deficits, gait normal   LABORATORY DATA: Results for Lee Ramirez, Lee Ramirez (MRN [id number]) as of 01/07/2012 16:57  Ref. Range 12/31/2011 13:13 12/31/2011 13:13 12/31/2011 13:30  Comment No range found  (NOTE)   LDH Latest Range: 94-250 U/L   225  Total Protein ELP Latest Range: 6.0-8.3 g/dL 6.0 5.8 (L)   Albumin ELP Latest Range: 55.8-66.1 %  60.6   Alpha-1-Globulin Latest Range: 2.9-4.9 %  5.5 (H)   Alpha-2-Globulin Latest Range: 7.1-11.8 %  11.3   Beta Globulin Latest Range: 4.7-7.2 %  5.8   Beta 2 Latest Range: 3.2-6.5 %  3.3   Gamma Globulin Latest Range: 11.1-18.8 %  13.5   M-SPIKE, % No range found  NOT DETECTED   SPE Interp. No range found  (NOTE)   IgG (Immunoglobin G), Serum Latest Range: [phone] mg/dL 409    IgA Latest Range: 68-379 mg/dL 66 (L)    IgM,  Serum Latest Range: 41-251 mg/dL 409    Haptoglobin Latest Range: 45-215 mg/dL   85  DAT, complement No range found NEG    DAT, IgG No range found NEG     Results for Lee Ramirez, Lee Ramirez (MRN [id number]) as of 01/07/2012 16:57  Ref. Range 10/18/2011 12:25 11/09/2011 15:24 11/15/2011 14:53 11/24/2011 20:45 11/25/2011 05:55 12/31/2011 13:30  WBC Latest Range: 4.0-10.5 K/uL 5.4 3.9 (L) 5.9 3.9 (L) 4.1   RBC Latest Range: 4.22-5.81 MIL/uL 2.69 (L)  2.79 (L) 2.97 (L) 2.62 (L) 2.72 (L)   Hemoglobin Latest Range: 13.0-17.0 g/dL 95.6 (L) 21.3 (L) 08.6 (L) 9.5 (L) 10.0 (L)   HCT Latest Range: 39.0-52.0 % 29.2 (L) 31.4 (L) 33.8 (L) 29.0 (L) 30.1 (L)   MCV Latest Range: 78.0-100.0 fL 108.6 (H) 112.5 (H) 113.8 (H) 110.7 (H) 110.7 (H)   MCH Latest Range: 26.0-34.0 pg 37.5 (H) 36.9 (H) 35.7 (H) 36.3 (H) 36.8 (H)   MCHC Latest Range: 30.0-36.0 g/dL 57.8 46.9 62.9 52.8 41.3   RDW Latest Range: 11.5-15.5 % 14.1 14.2 14.5 14.7 14.6   Platelets Latest Range: 150-400 K/uL 88 (L) 83 (L) 105 (L) 84 (L) 80 (L)   Neutrophils Relative Latest Range: 43-77 %  55  47    Lymphocytes Relative Latest Range: 12-46 %  35  40    Monocytes Relative Latest Range: 3-12 %  6  7    Eosinophils Relative Latest Range: 0-5 %  4  5    Basophils Relative Latest Range: 0-1 %  1  1    NEUT# Latest Range: 1.7-7.7 K/uL  2.2  1.8    Lymphocytes Absolute Latest Range: 0.7-4.0 K/uL  1.4  1.6    Monocytes Absolute Latest Range: 0.1-1.0 K/uL  0.2  0.3    Eosinophils Absolute Latest Range: 0.0-0.7 K/uL  0.2  0.2    Basophils Absolute Latest Range: 0.0-0.1 K/uL  0.0  0.1    RBC Morphology No range found  OVAL MACROCYTES      Haptoglobin Latest Range: 45-215 mg/dL      85    Results for Lee Ramirez, Lee Ramirez (MRN [id number]) as of 01/07/2012 16:57  Ref. Range 10/02/2009 00:31 10/02/2009 05:05 10/02/2009 18:39 10/03/2009 03:30 10/04/2009 03:20  WBC Latest Range: 4.0-10.5 K/uL 6.1 7.3  8.6 7.6  RBC Latest Range: 4.22-5.81 MIL/uL 3.31 (L) 3.16 (L)  3.01 (L) 2.78 (L)  Hemoglobin Latest Range: 13.0-17.0 g/dL 53.6 (L) 64.4 (L)  03.4 (L) 10.1 (L)  HCT Latest Range: 39.0-52.0 % 34.3 (L) 33.0 (L)  31.3 (L) 28.9 (L)  MCV Latest Range: 78.0-100.0 fL 103.4 (H) 104.6 (H)  103.8 (H) 104.0 (H)  MCHC Latest Range: 30.0-36.0 g/dL 74.2 59.5  63.8 75.6  RDW Latest Range: 11.5-15.5 % 14.6 14.5  14.3 14.4  Platelets Latest Range: 150-400 K/uL 106 (L) 116 (L) 109 (L) 89 (L) 78 (L)     RADIOGRAPHIC  STUDIES:  01/02/2012  *RADIOLOGY REPORT*  Clinical Data: Generalized abdominal pain. History of  thrombocytopenia.  COMPLETE ABDOMINAL ULTRASOUND  Comparison: No priors.  Findings:  Gallbladder: No shadowing gallstones or echogenic sludge. No  gallbladder wall thickening or pericholecystic fluid. Negative  sonographic Murphy's sign according to the ultrasound technologist.  Common bile duct: 4 mm within the porta hepatis.  Liver: Normal size and echotexture without focal parenchymal  abnormality. Patent portal vein with hepatopetal flow.  IVC: Patent throughout its visualized course in the abdomen.  Pancreas: Although the pancreas is difficult to visualize in its  entirety, no focal pancreatic abnormality is identified.  Spleen: Normal size and echotexture without focal parenchymal  abnormality.  Right Kidney: No hydronephrosis. Well-preserved cortex. Normal  size and parenchymal echotexture without focal abnormalities.  Left Kidney: No hydronephrosis. Well-preserved cortex. Normal  size and parenchymal echotexture without focal abnormalities.  Abdominal aorta: Cannot be visualized secondary to overlying bowel  gas.  Additional findings: Bilateral pleural effusions.  IMPRESSION:  1. No acute abnormality in the abdomen to account for the  patient's symptoms.  2. Bilateral pleural effusions.  Original Report Authenticated By: Florencia Reasons, M.D.    ASSESSMENT:  1. Anemia, macrocytic 2. Thrombocytopenia 3. Occassions of leukopenia 4. Fatigue 5. LE edema   PLAN:  1. I personally reviewed and went over laboratory results with the patient. 2. I personally reviewed and went over radiographic studies with the patient. 3. I provided the patient with an Rx for intermediate grade (20-30 mm Hg) of compression stockings. 4. Encouraged the patient to continue elevation of his LE.  5. Lab work today: CBC diff, B12, MMA, retic count 6. Discussion about options bone marrow biopsy  and aspiration vs B12 injections x 4 weeks and re-evaluate. 7. Patient has chosen B12 injections.  8. Supportive therapy plan built of B12 injections twice per week x 2 weeks and then weekly x 2 weeks. 9. CBC diff in 5 weeks.  10. Discussion regarding bone marrow aspiration and biopsy. 11. Discussion regarding potential diagnoses from such a procedure and potential interventions, including supportive care. 12. Echocardiogram to evaluate cardiac function. 13. Return in 5 weeks for follow-up.  All questions were answered. The patient knows to call the clinic with any problems, questions or concerns. We can certainly see the patient much sooner if necessary.  The patient and plan discussed with Glenford Peers, MD and he is in agreement with the aforementioned. Patient seen by Dr. Mariel Sleet as well.  More than 50% of the time spent with the patient was utilized for counseling and coordination of care.  I spent 25 minutes counseling the patient face to face. The total time spent in the appointment was 30 minutes.  Chameka Mcmullen

## 2012-01-08 ENCOUNTER — Encounter (HOSPITAL_COMMUNITY): Payer: Medicare Other | Admitting: Oncology

## 2012-01-08 LAB — VITAMIN B12: Vitamin B-12: 344 pg/mL (ref 211–911)

## 2012-01-09 ENCOUNTER — Ambulatory Visit (HOSPITAL_COMMUNITY)
Admission: RE | Admit: 2012-01-09 | Discharge: 2012-01-09 | Disposition: A | Discharge status: Home / Self Care | Payer: Medicare Other | Source: Ambulatory Visit | Attending: Oncology | Admitting: Oncology

## 2012-01-09 DIAGNOSIS — E785 Hyperlipidemia, unspecified: Secondary | ICD-10-CM | POA: Insufficient documentation

## 2012-01-09 DIAGNOSIS — I2510 Atherosclerotic heart disease of native coronary artery without angina pectoris: Secondary | ICD-10-CM | POA: Insufficient documentation

## 2012-01-09 DIAGNOSIS — R609 Edema, unspecified: Secondary | ICD-10-CM | POA: Insufficient documentation

## 2012-01-09 DIAGNOSIS — J90 Pleural effusion, not elsewhere classified: Secondary | ICD-10-CM | POA: Insufficient documentation

## 2012-01-09 DIAGNOSIS — I10 Essential (primary) hypertension: Secondary | ICD-10-CM | POA: Insufficient documentation

## 2012-01-09 DIAGNOSIS — I447 Left bundle-branch block, unspecified: Secondary | ICD-10-CM | POA: Insufficient documentation

## 2012-01-09 DIAGNOSIS — I319 Disease of pericardium, unspecified: Secondary | ICD-10-CM

## 2012-01-09 DIAGNOSIS — R0789 Other chest pain: Secondary | ICD-10-CM | POA: Insufficient documentation

## 2012-01-09 DIAGNOSIS — R600 Localized edema: Secondary | ICD-10-CM

## 2012-01-09 DIAGNOSIS — I080 Rheumatic disorders of both mitral and aortic valves: Secondary | ICD-10-CM | POA: Insufficient documentation

## 2012-01-09 NOTE — Progress Notes (Signed)
*  PRELIMINARY RESULTS* Echocardiogram 2D Echocardiogram has been performed.  Caswell Corwin 01/09/2012, 12:49 PM

## 2012-01-10 ENCOUNTER — Other Ambulatory Visit (HOSPITAL_COMMUNITY): Payer: Self-pay | Admitting: Oncology

## 2012-01-10 ENCOUNTER — Encounter (HOSPITAL_BASED_OUTPATIENT_CLINIC_OR_DEPARTMENT_OTHER): Payer: Medicare Other

## 2012-01-10 VITALS — BP 123/77 | HR 94 | Temp 97.6°F

## 2012-01-10 DIAGNOSIS — D539 Nutritional anemia, unspecified: Secondary | ICD-10-CM

## 2012-01-10 DIAGNOSIS — I447 Left bundle-branch block, unspecified: Secondary | ICD-10-CM

## 2012-01-10 DIAGNOSIS — E538 Deficiency of other specified B group vitamins: Secondary | ICD-10-CM

## 2012-01-10 DIAGNOSIS — D696 Thrombocytopenia, unspecified: Secondary | ICD-10-CM

## 2012-01-10 DIAGNOSIS — R931 Abnormal findings on diagnostic imaging of heart and coronary circulation: Secondary | ICD-10-CM

## 2012-01-10 MED ORDER — CYANOCOBALAMIN 1000 MCG/ML IJ SOLN
INTRAMUSCULAR | Status: AC
  Filled 2012-01-10: qty 1

## 2012-01-10 MED ORDER — CYANOCOBALAMIN 1000 MCG/ML IJ SOLN
1000.0000 ug | Freq: Once | INTRAMUSCULAR | Status: AC
  Administered 2012-01-10: 1000 ug via INTRAMUSCULAR

## 2012-01-10 NOTE — Progress Notes (Signed)
Lee Ramirez presents today for injection per MD orders. Vit b 12 1000 mcg administered im in right Upper Arm. Administration without incident. Patient tolerated well.

## 2012-01-11 ENCOUNTER — Encounter: Payer: Self-pay | Admitting: *Deleted

## 2012-01-11 ENCOUNTER — Other Ambulatory Visit: Payer: Self-pay | Admitting: Adult Health

## 2012-01-11 ENCOUNTER — Ambulatory Visit (HOSPITAL_COMMUNITY): Payer: Medicare Other | Admitting: Oncology

## 2012-01-11 ENCOUNTER — Encounter: Payer: Self-pay | Admitting: Adult Health

## 2012-01-11 ENCOUNTER — Ambulatory Visit (INDEPENDENT_AMBULATORY_CARE_PROVIDER_SITE_OTHER): Payer: Medicare Other | Admitting: Adult Health

## 2012-01-11 DIAGNOSIS — R0602 Shortness of breath: Secondary | ICD-10-CM

## 2012-01-11 DIAGNOSIS — I10 Essential (primary) hypertension: Secondary | ICD-10-CM

## 2012-01-11 DIAGNOSIS — R079 Chest pain, unspecified: Secondary | ICD-10-CM

## 2012-01-11 DIAGNOSIS — I2510 Atherosclerotic heart disease of native coronary artery without angina pectoris: Secondary | ICD-10-CM

## 2012-01-11 MED ORDER — FUROSEMIDE 20 MG PO TABS: 20.0000 mg | ORAL_TABLET | Freq: Every day | ORAL | Status: DC

## 2012-01-11 MED ORDER — POTASSIUM CHLORIDE ER 10 MEQ PO CPCR: 10.0000 meq | ORAL_CAPSULE | Freq: Every day | ORAL | Status: AC

## 2012-01-11 MED ORDER — CARVEDILOL 3.125 MG PO TABS: 3.1250 mg | ORAL_TABLET | Freq: Two times a day (BID) | ORAL | Status: DC

## 2012-01-11 NOTE — Patient Instructions (Signed)
Your physician recommends that you schedule a follow-up appointment in: 1 week with Dr Dietrich Pates  Your physician recommends that you return for lab work in: TODAY  Your physician has requested that you have a lexiscan myoview. For further information please visit [email or web address] Please follow instruction sheet, as given.  Your physician has recommended you make the following change in your medication:  1 - START Coreg (Carvedilol) 3.125 mg twice a day 2 - START Lasix (Furosemide) 20 mg daily 3 - START Potassium 10 meq daily

## 2012-01-11 NOTE — Progress Notes (Signed)
HPI: [name] is a 78 y/o patient of Dr. Dietrich Pates we are seeing for ongoing assessment and treatment of CAD, with stents to the RCA and LAD in 2011, hypercholesterolemia, hypertension. He is followed by Dr. Devona Konig for anemia and thrombocytopenia. He has been having continued complaint so DOE, fluid retention and overall fatigue since hospitalization in March for pneumonia. He was seen by Jenita Seashore, PA for oncology who ordered echocardiogram in the setting of his symptoms. He was found to have significant systolic dysfunction with EF of 15% with all myocardial segments virtually akinetic except the posterior base where there is moderate hypokinesis. Most recent echo prior to this was in January of 2012 demonstrated EF of 50% to 55%.    He denies chest pain, but does feel some pressure and NYHA Class 11-111 symptoms. He states that he is often too tired to get out of bed. He has noticed more fluid retention over the last 2 weeks.  Allergies  Allergen Reactions  . Aspirin Anaphylaxis    Current Outpatient Prescriptions  Medication Sig Dispense Refill  . acetaminophen (TYLENOL) 500 MG tablet Take 1,000 mg by mouth every 6 (six) hours as needed. Pain      . albuterol (PROAIR HFA) 108 (90 BASE) MCG/ACT inhaler Inhale 2 puffs into the lungs every 6 (six) hours as needed. For shortness of breath      . allopurinol (ZYLOPRIM) 300 MG tablet Take 150 mg by mouth daily.       Marland Kitchen CRANBERRY PO Take 1 capsule by mouth daily.      . Ferrous Sulfate (IRON) 28 MG TABS Take 1 tablet by mouth daily.      Marland Kitchen FLORINEF 0.1 MG tablet TAKE 1 TABLET BY MOUTH ONCE A DAY.  30 each  6  . meclizine (ANTIVERT) 12.5 MG tablet Take 12.5 mg by mouth 3 (three) times daily as needed. Dizziness      . Multiple Vitamins-Minerals (MULTIVITAMIN WITH MINERALS) tablet Take 1 tablet by mouth daily.       . nitroGLYCERIN (NITROSTAT) 0.4 MG SL tablet Place 1 tablet (0.4 mg total) under the tongue every 5 (five) minutes as needed.   25 tablet  10  . omeprazole (PRILOSEC) 20 MG capsule Take 20 mg by mouth daily.      . ondansetron (ZOFRAN) 4 MG tablet Take 4 mg by mouth every 6 (six) hours.      Marland Kitchen PRAVACHOL 40 MG tablet TAKE (1) TABLET BY MOUTH AT BEDTIME FOR CHOLESTEROL.  30 each  6  . Travoprost, BAK Free, (TRAVATAN) 0.004 % SOLN ophthalmic solution Place 1 drop into both eyes at bedtime.       Marland Kitchen VITAMIN B1-B12 IJ Inject as directed.      . zolpidem (AMBIEN) 10 MG tablet Take 5 mg by mouth at bedtime as needed. For sleep      . carvedilol (COREG) 3.125 MG tablet Take 1 tablet (3.125 mg total) by mouth 2 (two) times daily with a meal.  60 tablet  12  . furosemide (LASIX) 20 MG tablet Take 1 tablet (20 mg total) by mouth daily.  30 tablet  12  . potassium chloride (MICRO-K) 10 MEQ CR capsule Take 1 capsule (10 mEq total) by mouth daily.  30 capsule  12    Past Medical History  Diagnosis Date  . ASCVD (arteriosclerotic cardiovascular disease)     Sizable non-Q. myocardial infarction in 09/2009; severe three-vessel disease with an ejection fraction of 35%; three-vessel DES  complicated by thrombocytopenia and mild renal insufficiency  . Hyperlipidemia     09/2010: TC-151, TG-411, H.-32, L.-not calculated   . Anemia     H/H -12.3/37.2; MCV-103; platelets-114 (08/2010)   . 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 Hypertension   . Orthostatic hypotension     Lightheaded; no syncope  . Thrombocytopenia, acquired     09/2010-114,000  . Hepatic steatosis 2013  . Thrombocytopenia     referred to hem-onc 5/13    Past Surgical History  Procedure Date  . Inguinal hernia repair     Right  . Appendectomy   . Coronary angioplasty with stent placement     ZOX:WRUEAV of systems complete and found to be negative unless listed above PHYSICAL EXAM BP 125/82  Pulse 88  Resp 16  Ht 5\' 11"  (1.803 m)  Wt 192 lb (87.091 kg)  BMI 26.78 kg/m2  General: Well developed, well nourished,  in no acute distress, pale Head: Eyes PERRLA, No xanthomas.   Normal cephalic and atramatic  Lungs: Clear bilaterally to auscultation with mild crackles in the bases. Heart: HRRR S1 S2, distant,without MRG.  Pulses are 2+ & equal.            No carotid bruit.Mild JVD at 12 cm..  No abdominal bruits. No femoral bruits. Abdomen: Bowel sounds are positive, abdomen soft and non-tender without masses or                  Hernia's noted. Msk:  Back normal, normal gait. diminished strength and tone for age. Extremities: No clubbing, cyanosis 1+ pretibial and 2+ ankle edema.  DP +1 Neuro: Alert and oriented X 3. Psych:  Good affect, responds appropriately    ASSESSMENT AND PLAN:

## 2012-01-11 NOTE — Assessment & Plan Note (Signed)
He has had a significant decrease in systolic dysfunction with Ef of 50-55% in January of 2012 to 15% on 12/2011. He is symptomatic with fatigue and NYHA Class 11-111 symptoms. I will place him on low dose coreg 3.12 mg BID and lasix 20 mg daily with potassium replacement. I will check a BMET and BNP.   I have discussed the need to schedule patient for cardiac cath once diuresed with Dr Diona Browner, secondary to know CAD with stenting. He has recommended that the patient be scheduled for a stress myoview.  Will plan this using Lexiscan.   I have advised the patient to call for any BP < 100 systolic or severe dizziness or near syncope. He is to weigh daily and avoid salt. He is advised that the diuretic will cause him to urinate more and to be careful getting up from lying to standing position. He will see Dr. Dietrich Pates on follow up with further recommendations for ICD and/or cath once all data is available.

## 2012-01-12 LAB — BRAIN NATRIURETIC PEPTIDE: Brain Natriuretic Peptide: 756.2 pg/mL — ABNORMAL HIGH (ref 0.0–100.0)

## 2012-01-12 LAB — BASIC METABOLIC PANEL
BUN: 15 mg/dL (ref 6–23)
Calcium: 9.2 mg/dL (ref 8.4–10.5)
Potassium: 4.6 mEq/L (ref 3.5–5.3)
Sodium: 143 mEq/L (ref 135–145)

## 2012-01-15 ENCOUNTER — Encounter (HOSPITAL_BASED_OUTPATIENT_CLINIC_OR_DEPARTMENT_OTHER): Payer: Medicare Other

## 2012-01-15 VITALS — BP 122/80 | HR 84

## 2012-01-15 DIAGNOSIS — E538 Deficiency of other specified B group vitamins: Secondary | ICD-10-CM

## 2012-01-15 DIAGNOSIS — D539 Nutritional anemia, unspecified: Secondary | ICD-10-CM

## 2012-01-15 DIAGNOSIS — D696 Thrombocytopenia, unspecified: Secondary | ICD-10-CM

## 2012-01-15 MED ORDER — CYANOCOBALAMIN 1000 MCG/ML IJ SOLN
1000.0000 ug | Freq: Once | INTRAMUSCULAR | Status: AC
  Administered 2012-01-15: 1000 ug via INTRAMUSCULAR

## 2012-01-15 MED ORDER — CYANOCOBALAMIN 1000 MCG/ML IJ SOLN
INTRAMUSCULAR | Status: AC
  Filled 2012-01-15: qty 1

## 2012-01-15 NOTE — Progress Notes (Signed)
Lee Ramirez presents today for injection per MD orders. B12 1000 mcg administered IM in right Upper Arm. Administration without incident. Patient tolerated well.

## 2012-01-16 ENCOUNTER — Encounter (HOSPITAL_COMMUNITY)
Admission: RE | Admit: 2012-01-16 | Discharge: 2012-01-16 | Disposition: A | Discharge status: Home / Self Care | Payer: Medicare Other | Source: Ambulatory Visit | Attending: Adult Health | Admitting: Adult Health

## 2012-01-16 ENCOUNTER — Encounter (HOSPITAL_COMMUNITY): Payer: Self-pay

## 2012-01-16 ENCOUNTER — Ambulatory Visit (INDEPENDENT_AMBULATORY_CARE_PROVIDER_SITE_OTHER): Payer: Medicare Other

## 2012-01-16 ENCOUNTER — Encounter (HOSPITAL_COMMUNITY): Payer: Self-pay | Admitting: Cardiology

## 2012-01-16 DIAGNOSIS — R079 Chest pain, unspecified: Secondary | ICD-10-CM

## 2012-01-16 DIAGNOSIS — R0609 Other forms of dyspnea: Secondary | ICD-10-CM | POA: Insufficient documentation

## 2012-01-16 DIAGNOSIS — I2510 Atherosclerotic heart disease of native coronary artery without angina pectoris: Secondary | ICD-10-CM

## 2012-01-16 DIAGNOSIS — R0602 Shortness of breath: Secondary | ICD-10-CM

## 2012-01-16 DIAGNOSIS — R0989 Other specified symptoms and signs involving the circulatory and respiratory systems: Secondary | ICD-10-CM | POA: Insufficient documentation

## 2012-01-16 MED ORDER — TECHNETIUM TC 99M TETROFOSMIN IV KIT
10.0000 | PACK | Freq: Once | INTRAVENOUS | Status: AC | PRN
  Administered 2012-01-16: 9 via INTRAVENOUS

## 2012-01-16 MED ORDER — TECHNETIUM TC 99M TETROFOSMIN IV KIT
30.0000 | PACK | Freq: Once | INTRAVENOUS | Status: AC | PRN
  Administered 2012-01-16: 29.7 via INTRAVENOUS

## 2012-01-16 NOTE — Progress Notes (Signed)
Stress Lab Nurses Notes - [facility]  JAZMINE LONGSHORE 01/16/2012 Reason for doing test: CAD, Chest Pain and Dyspnea Type of test: Steffanie Dunn Nurse performing test: Parke Poisson, RN Nuclear Medicine Tech: Lyndel Pleasure Echo Tech: Not Applicable MD performing test: R. Rothbart Family MD: McGough Test explained and consent signed: yes IV started: 22g jelco, Saline lock flushed, No redness or edema and Saline lock started in radiology Symptoms: none Treatment/Intervention: None Reason test stopped: protocol completed After recovery IV was: Discontinued via X-ray tech and No redness or edema Patient to return to Nuc. Med at :  12Noon Patient discharged: Home Patient's Condition upon discharge was: stable Comments: During test BP 120/70 & HR 85.  Recovery BP 118/68 & HR 78.  Symptoms resolved in recovery. Erskine Speed T

## 2012-01-18 ENCOUNTER — Encounter (HOSPITAL_BASED_OUTPATIENT_CLINIC_OR_DEPARTMENT_OTHER): Payer: Medicare Other

## 2012-01-18 ENCOUNTER — Encounter: Payer: Self-pay | Admitting: *Deleted

## 2012-01-18 ENCOUNTER — Ambulatory Visit (INDEPENDENT_AMBULATORY_CARE_PROVIDER_SITE_OTHER): Payer: Medicare Other | Admitting: Cardiology

## 2012-01-18 ENCOUNTER — Encounter: Payer: Self-pay | Admitting: Cardiology

## 2012-01-18 VITALS — BP 126/73 | HR 80 | Ht 74.0 in | Wt 180.0 lb

## 2012-01-18 VITALS — BP 147/94 | HR 81

## 2012-01-18 DIAGNOSIS — D696 Thrombocytopenia, unspecified: Secondary | ICD-10-CM

## 2012-01-18 DIAGNOSIS — R0602 Shortness of breath: Secondary | ICD-10-CM

## 2012-01-18 DIAGNOSIS — I4891 Unspecified atrial fibrillation: Secondary | ICD-10-CM

## 2012-01-18 DIAGNOSIS — E538 Deficiency of other specified B group vitamins: Secondary | ICD-10-CM

## 2012-01-18 DIAGNOSIS — D539 Nutritional anemia, unspecified: Secondary | ICD-10-CM

## 2012-01-18 DIAGNOSIS — I10 Essential (primary) hypertension: Secondary | ICD-10-CM

## 2012-01-18 DIAGNOSIS — I2510 Atherosclerotic heart disease of native coronary artery without angina pectoris: Secondary | ICD-10-CM

## 2012-01-18 DIAGNOSIS — D6959 Other secondary thrombocytopenia: Secondary | ICD-10-CM

## 2012-01-18 DIAGNOSIS — I951 Orthostatic hypotension: Secondary | ICD-10-CM

## 2012-01-18 MED ORDER — CARVEDILOL 12.5 MG PO TABS: 12.5000 mg | ORAL_TABLET | Freq: Two times a day (BID) | ORAL | Status: DC

## 2012-01-18 MED ORDER — CYANOCOBALAMIN 1000 MCG/ML IJ SOLN
1000.0000 ug | Freq: Once | INTRAMUSCULAR | Status: AC
  Administered 2012-01-18: 1000 ug via INTRAMUSCULAR

## 2012-01-18 MED ORDER — LISINOPRIL 5 MG PO TABS: 5.0000 mg | ORAL_TABLET | Freq: Every day | ORAL | Status: DC

## 2012-01-18 MED ORDER — CYANOCOBALAMIN 1000 MCG/ML IJ SOLN
INTRAMUSCULAR | Status: AC
  Filled 2012-01-18: qty 1

## 2012-01-18 NOTE — Assessment & Plan Note (Addendum)
Dr. Mariel Sleet he is treating anemia with vitamin B injections despite conventionally normal serum level.  In light of the patient's advanced age, multiple medical problems and relatively mild and stable anemia, an empiric trial of a benign treatment appears appropriate.

## 2012-01-18 NOTE — Patient Instructions (Signed)
Your physician recommends that you schedule a follow-up appointment in: 7 weeks  Your physician has recommended you make the following change in your medication:  1 - INCREASE Coreg (Carvedilol) to 6.25 mg twice a day for 2 weeks then INCREASE to 12.5 mg twice a day thereafter      A NEW PRESCRIPTION HAS BEEN SENT FOR THE 12.5 MG TABLETS 2 - START Lisinopril 5 mg daily 3 - STOP Florinef  Your physician recommends that you return for lab work in: 3 weeks (you will receive a reminder letter)  Refer to attached references related to CHF and low sodium diet

## 2012-01-18 NOTE — Progress Notes (Signed)
Tolerated Vitamin b-12 inj. Well.

## 2012-01-18 NOTE — Progress Notes (Deleted)
Name: Lee Ramirez    DOB: 04-24-1922  Age: 78 y.o.  MR#: [id number]       PCP:  [name], MD, MD      Insurance: @PAYORNAME @   CC:    Chief Complaint  Patient presents with  . No complaints    Follow up myoview - Med list/TC    VS BP 124/84  Pulse 87  Ht 6\' 2"  (1.88 m)  Wt 180 lb (81.647 kg)  BMI 23.11 kg/m2  SpO2 95%  Weights Current Weight  01/18/12 180 lb (81.647 kg)  01/11/12 192 lb (87.091 kg)  01/07/12 193 lb 11.2 oz (87.862 kg)    Blood Pressure  BP Readings from Last 3 Encounters:  01/18/12 124/84  01/18/12 147/94  01/15/12 122/80     Admit date:  (Not on file) Last encounter with RMR:  12/21/2011   Allergy Allergies  Allergen Reactions  . Aspirin Anaphylaxis    Current Outpatient Prescriptions  Medication Sig Dispense Refill  . acetaminophen (TYLENOL) 500 MG tablet Take 1,000 mg by mouth every 6 (six) hours as needed. Pain      . albuterol (PROAIR HFA) 108 (90 BASE) MCG/ACT inhaler Inhale 2 puffs into the lungs every 6 (six) hours as needed. For shortness of breath      . allopurinol (ZYLOPRIM) 300 MG tablet Take 150 mg by mouth daily.       . carvedilol (COREG) 3.125 MG tablet Take 1 tablet (3.125 mg total) by mouth 2 (two) times daily with a meal.  60 tablet  12  . CRANBERRY PO Take 1 capsule by mouth daily.      . Ferrous Sulfate (IRON) 28 MG TABS Take 1 tablet by mouth daily.      Marland Kitchen FLORINEF 0.1 MG tablet TAKE 1 TABLET BY MOUTH ONCE A DAY.  30 each  6  . furosemide (LASIX) 20 MG tablet Take 1 tablet (20 mg total) by mouth daily.  30 tablet  12  . meclizine (ANTIVERT) 12.5 MG tablet Take 12.5 mg by mouth 3 (three) times daily as needed. Dizziness      . Multiple Vitamins-Minerals (MULTIVITAMIN WITH MINERALS) tablet Take 1 tablet by mouth daily.       . nitroGLYCERIN (NITROSTAT) 0.4 MG SL tablet Place 1 tablet (0.4 mg total) under the tongue every 5 (five) minutes as needed.  25 tablet  10  . omeprazole (PRILOSEC) 20 MG capsule Take 20 mg by  mouth daily.      . ondansetron (ZOFRAN) 4 MG tablet Take 4 mg by mouth every 6 (six) hours.      . potassium chloride (MICRO-K) 10 MEQ CR capsule Take 1 capsule (10 mEq total) by mouth daily.  30 capsule  12  . PRAVACHOL 40 MG tablet TAKE (1) TABLET BY MOUTH AT BEDTIME FOR CHOLESTEROL.  30 each  6  . Travoprost, BAK Free, (TRAVATAN) 0.004 % SOLN ophthalmic solution Place 1 drop into both eyes at bedtime.       Marland Kitchen VITAMIN B1-B12 IJ Inject as directed.      . zolpidem (AMBIEN) 10 MG tablet Take 5 mg by mouth at bedtime as needed. For sleep       No current facility-administered medications for this visit.   Facility-Administered Medications Ordered in Other Visits  Medication Dose Route Frequency Provider Last Rate Last Dose  . cyanocobalamin ((VITAMIN B-12)) injection 1,000 mcg  1,000 mcg Intramuscular Once Ellouise Newer, PA   1,000 mcg at 01/18/12  1610    Discontinued Meds:   There are no discontinued medications.  Patient Active Problem List  Diagnoses  . LEFT BUNDLE BRANCH BLOCK  . ATHEROSCLEROTIC CARDIOVASCULAR DISEASE  . Thrombocytopenia, acquired  . Hyperlipidemia  . Hypertension  . Orthostatic hypotension  . Anemia, macrocytic  . Insomnia  . Chest pain  . GERD (gastroesophageal reflux disease)    LABS Orders Only on 01/11/2012  Component Date Value  . Sodium 01/11/2012 143   . Potassium 01/11/2012 4.6   . Chloride 01/11/2012 107   . CO2 01/11/2012 28   . Glucose, Bld 01/11/2012 93   . BUN 01/11/2012 15   . Creat 01/11/2012 1.65*  . Calcium 01/11/2012 9.2   . Brain Natriuretic Peptide 01/11/2012 756.2*  Office Visit on 01/07/2012  Component Date Value  . WBC 01/07/2012 4.6   . RBC 01/07/2012 2.71*  . Hemoglobin 01/07/2012 9.7*  . HCT 01/07/2012 30.0*  . MCV 01/07/2012 110.7*  . MCH 01/07/2012 35.8*  . MCHC 01/07/2012 32.3   . RDW 01/07/2012 16.1*  . Platelets 01/07/2012 78*  . Neutrophils Relative 01/07/2012 54   . Lymphocytes Relative 01/07/2012 35     . Monocytes Relative 01/07/2012 6   . Eosinophils Relative 01/07/2012 4   . Basophils Relative 01/07/2012 1   . Neutro Abs 01/07/2012 2.5   . Lymphs Abs 01/07/2012 1.6   . Monocytes Absolute 01/07/2012 0.3   . Eosinophils Absolute 01/07/2012 0.2   . Basophils Absolute 01/07/2012 0.0   . WBC Morphology 01/07/2012 ATYPICAL LYMPHOCYTES   . Smear Review 01/07/2012 PLATELET COUNT CONFIRMED BY SMEAR   . Retic Ct Pct 01/07/2012 1.4   . RBC. 01/07/2012 2.71*  . Retic Count, Manual 01/07/2012 37.9   . Vitamin B-12 01/07/2012 344   . Folate 01/07/2012 >20.0   . Methylmalonic Acid, Quan* 01/07/2012 0.52*  Office Visit on 12/31/2011  Component Date Value  . Sodium 12/31/2011 142   . Potassium 12/31/2011 4.0   . Chloride 12/31/2011 105   . CO2 12/31/2011 29   . Glucose, Bld 12/31/2011 100*  . BUN 12/31/2011 11   . Creatinine, Ser 12/31/2011 1.23   . Calcium 12/31/2011 9.6   . Total Protein 12/31/2011 6.5   . Albumin 12/31/2011 3.7   . AST 12/31/2011 15   . ALT 12/31/2011 12   . Alkaline Phosphatase 12/31/2011 51   . Total Bilirubin 12/31/2011 1.1   . GFR calc non Af Amer 12/31/2011 51*  . GFR calc Af Amer 12/31/2011 59*  . Total Protein ELP 12/31/2011 5.8*  . Albumin ELP 12/31/2011 60.6   . Alpha-1-Globulin 12/31/2011 5.5*  . Alpha-2-Globulin 12/31/2011 11.3   . Beta Globulin 12/31/2011 5.8   . Beta 2 12/31/2011 3.3   . Gamma Globulin 12/31/2011 13.5   . M-Spike, % 12/31/2011 NOT DETECTED   . SPE Interp. 12/31/2011 (NOTE)   . Comment 12/31/2011 (NOTE)   . Total Protein ELP 12/31/2011 6.0   . IgG (Immunoglobin G), Se* 12/31/2011 860   . IgA 12/31/2011 66*  . IgM, Serum 12/31/2011 121   . Immunofix Electr Int 12/31/2011 (NOTE)   . DAT, complement 12/31/2011 NEG   . DAT, IgG 12/31/2011 NEG   . Haptoglobin 12/31/2011 85   . LDH 12/31/2011 225   Admission on 11/24/2011, Discharged on 11/25/2011  Component Date Value  . WBC 11/24/2011 3.9*  . RBC 11/24/2011 2.62*  . Hemoglobin  11/24/2011 9.5*  . HCT 11/24/2011 29.0*  . MCV 11/24/2011 110.7*  .  MCH 11/24/2011 36.3*  . MCHC 11/24/2011 32.8   . RDW 11/24/2011 14.7   . Platelets 11/24/2011 84*  . Neutrophils Relative 11/24/2011 47   . Neutro Abs 11/24/2011 1.8   . Lymphocytes Relative 11/24/2011 40   . Lymphs Abs 11/24/2011 1.6   . Monocytes Relative 11/24/2011 7   . Monocytes Absolute 11/24/2011 0.3   . Eosinophils Relative 11/24/2011 5   . Eosinophils Absolute 11/24/2011 0.2   . Basophils Relative 11/24/2011 1   . Basophils Absolute 11/24/2011 0.1   . Sodium 11/24/2011 142   . Potassium 11/24/2011 3.8   . Chloride 11/24/2011 105   . CO2 11/24/2011 28   . Glucose, Bld 11/24/2011 105*  . BUN 11/24/2011 11   . Creatinine, Ser 11/24/2011 1.13   . Calcium 11/24/2011 10.1   . Total Protein 11/24/2011 6.4   . Albumin 11/24/2011 3.8   . AST 11/24/2011 21   . ALT 11/24/2011 16   . Alkaline Phosphatase 11/24/2011 39   . Total Bilirubin 11/24/2011 0.8   . GFR calc non Af Amer 11/24/2011 56*  . GFR calc Af Amer 11/24/2011 65*  . Troponin I 11/24/2011 <0.30   . Prothrombin Time 11/24/2011 14.7   . INR 11/24/2011 1.13   . Total CK 11/24/2011 57   . CK, MB 11/24/2011 2.2   . Troponin I 11/24/2011 <0.30   . Relative Index 11/24/2011 RELATIVE INDEX IS INVALID   . Vitamin B-12 11/24/2011 403   . TSH 11/25/2011 4.343   . Total CK 11/25/2011 41   . CK, MB 11/25/2011 1.8   . Troponin I 11/25/2011 <0.30   . Relative Index 11/25/2011 RELATIVE INDEX IS INVALID   . WBC 11/25/2011 4.1   . RBC 11/25/2011 2.72*  . Hemoglobin 11/25/2011 10.0*  . HCT 11/25/2011 30.1*  . MCV 11/25/2011 110.7*  . MCH 11/25/2011 36.8*  . MCHC 11/25/2011 33.2   . RDW 11/25/2011 14.6   . Platelets 11/25/2011 80*  . Sodium 11/25/2011 145   . Potassium 11/25/2011 4.0   . Chloride 11/25/2011 107   . CO2 11/25/2011 31   . Glucose, Bld 11/25/2011 99   . BUN 11/25/2011 11   . Creatinine, Ser 11/25/2011 1.21   . Calcium 11/25/2011 9.9     . Total Protein 11/25/2011 5.9*  . Albumin 11/25/2011 3.7   . AST 11/25/2011 14   . ALT 11/25/2011 13   . Alkaline Phosphatase 11/25/2011 39   . Total Bilirubin 11/25/2011 1.0   . GFR calc non Af Amer 11/25/2011 52*  . GFR calc Af Amer 11/25/2011 60*  Office Visit on 11/15/2011  Component Date Value  . WBC 11/15/2011 5.9   . RBC 11/15/2011 2.97*  . Hemoglobin 11/15/2011 10.6*  . HCT 11/15/2011 33.8*  . MCV 11/15/2011 113.8*  . [facility] 11/15/2011 35.7*  . MCHC 11/15/2011 31.4   . RDW 11/15/2011 14.5   . Platelets 11/15/2011 105*  . Iron 11/15/2011 70   . UIBC 11/15/2011 239   . TIBC 11/15/2011 309   . %SAT 11/15/2011 23   Admission on 11/09/2011, Discharged on 11/09/2011  Component Date Value  . WBC 11/09/2011 3.9*  . RBC 11/09/2011 2.79*  . Hemoglobin 11/09/2011 10.3*  . HCT 11/09/2011 31.4*  . MCV 11/09/2011 112.5*  . [facility] 11/09/2011 36.9*  . MCHC 11/09/2011 32.8   . RDW 11/09/2011 14.2   . Platelets 11/09/2011 83*  . Neutrophils Relative 11/09/2011 55   . Neutro Abs 11/09/2011 2.2   .  Lymphocytes Relative 11/09/2011 35   . Lymphs Abs 11/09/2011 1.4   . Monocytes Relative 11/09/2011 6   . Monocytes Absolute 11/09/2011 0.2   . Eosinophils Relative 11/09/2011 4   . Eosinophils Absolute 11/09/2011 0.2   . Basophils Relative 11/09/2011 1   . Basophils Absolute 11/09/2011 0.0   . RBC Morphology 11/09/2011 OVAL MACROCYTES   . Sodium 11/09/2011 142   . Potassium 11/09/2011 3.9   . Chloride 11/09/2011 104   . CO2 11/09/2011 29   . Glucose, Bld 11/09/2011 113*  . BUN 11/09/2011 14   . Creatinine, Ser 11/09/2011 1.24   . Calcium 11/09/2011 9.9   . GFR calc non Af Amer 11/09/2011 50*  . GFR calc Af Amer 11/09/2011 58*  . Color, Urine 11/09/2011 AMBER*  . APPearance 11/09/2011 CLEAR   . Specific Gravity, Urine 11/09/2011 1.010   . pH 11/09/2011 7.0   . Glucose, UA 11/09/2011 NEGATIVE   . Hgb urine dipstick 11/09/2011 NEGATIVE   . Bilirubin Urine 11/09/2011 NEGATIVE   .  Ketones, ur 11/09/2011 NEGATIVE   . Protein, ur 11/09/2011 NEGATIVE   . Urobilinogen, UA 11/09/2011 1.0   . Nitrite 11/09/2011 NEGATIVE   . Leukocytes, UA 11/09/2011 NEGATIVE   . Specimen Description 11/09/2011 URINE, CLEAN CATCH   . Special Requests 11/09/2011 NONE   . Culture  Setup Time 11/09/2011 [id number]   . Colony Count 11/09/2011 NO GROWTH   . Culture 11/09/2011 NO GROWTH   . Report Status 11/09/2011 11/11/2011 FINAL      Results for this Opt Visit:     Results for orders placed in visit on 01/11/12  BASIC METABOLIC PANEL      Component Value Range   Sodium 143  135 - 145 (mEq/L)   Potassium 4.6  3.5 - 5.3 (mEq/L)   Chloride 107  96 - 112 (mEq/L)   CO2 28  19 - 32 (mEq/L)   Glucose, Bld 93  70 - 99 (mg/dL)   BUN 15  6 - 23 (mg/dL)   Creat 8.11 (*) 9.14 - 1.35 (mg/dL)   Calcium 9.2  8.4 - 78.2 (mg/dL)  BRAIN NATRIURETIC PEPTIDE      Component Value Range   Brain Natriuretic Peptide 756.2 (*) 0.0 - 100.0 (pg/mL)    EKG Orders placed during the hospital encounter of 11/24/11  . EKG     Prior Assessment and Plan Problem List as of 01/18/2012          Cardiology Problems   LEFT BUNDLE BRANCH BLOCK   ATHEROSCLEROTIC CARDIOVASCULAR DISEASE   Last Assessment & Plan Note   01/11/2012 Office Visit Signed 01/11/2012  3:40 PM by Jodelle Gross, NP    He has had a significant decrease in systolic dysfunction with Ef of 50-55% in January of 2012 to 15% on 12/2011. He is symptomatic with fatigue and NYHA Class 11-111 symptoms. I will place him on low dose coreg 3.12 mg BID and lasix 20 mg daily with potassium replacement. I will check a BMET and BNP.   I have discussed the need to schedule patient for cardiac cath once diuresed with Dr Diona Browner, secondary to know CAD with stenting. He has recommended that the patient be scheduled for a stress myoview.  Will plan this using Lexiscan.   I have advised the patient to call for any BP < 100 systolic or severe dizziness or near  syncope. He is to weigh daily and avoid salt. He is advised that the diuretic will cause  him to urinate more and to be careful getting up from lying to standing position. He will see Dr. Dietrich Pates on follow up with further recommendations for ICD and/or cath once all data is available.    Hyperlipidemia   Last Assessment & Plan Note   11/15/2011 Office Visit Signed 11/16/2011  3:30 PM by Kathlen Brunswick, MD    Lipid profile was excellent one year ago except for elevated triglycerides.  We will continue to monitor effectiveness of lipid lowering therapy.    Hypertension   Last Assessment & Plan Note   11/15/2011 Office Visit Signed 11/16/2011  3:31 PM by Kathlen Brunswick, MD    Blood pressure control has been excellent over the past month.  Continued monitoring and continuation of current therapy are appropriate.    Orthostatic hypotension   Last Assessment & Plan Note   11/15/2011 Office Visit Signed 11/16/2011  3:32 PM by Kathlen Brunswick, MD    No significant orthostatic change in blood pressure.      Other   Thrombocytopenia, acquired   Last Assessment & Plan Note   07/16/2011 Office Visit Signed 07/16/2011  7:29 PM by Kathlen Brunswick, MD    Mild thrombocytopenia is stable to improved.    Anemia, macrocytic   Last Assessment & Plan Note   11/15/2011 Office Visit Addendum 11/16/2011  3:28 PM by Kathlen Brunswick, MD    Anemia persists.  The patient has never undergone screening colonoscopy.  He developed severe constipation, which may have resulted from use of iron or from narcotic analgesics.  Previous iron studies indicate possible iron deficiency, but ferritin was well within the normal range.  Platelets continue to decrease.  The presence of accompanying thrombocytopenia suggests a possible bone marrow disorder.  Referral to a hematologist and gastroenterologist may be necessary.    Insomnia   Chest pain   GERD (gastroesophageal reflux disease)       Imaging: Dg Chest 2  View  01/03/2012  *RADIOLOGY REPORT*  Clinical Data: Follow up pleural effusion.  CHEST - 2 VIEW  Comparison: 11/24/2011.  Findings: The heart is borderline in size but stable.  There is tortuosity and calcification of the thoracic aorta.  Small bilateral pleural effusions persist.  There is vascular congestion without overt pulmonary edema.  Underlying chronic bronchitic type changes.  IMPRESSION:  Stable mild cardiac enlargement, vascular congestion and small effusions.  Original Report Authenticated By: P. Loralie Champagne, M.D.   US Abdomen Complete  01/02/2012  *RADIOLOGY REPORT*  Clinical Data:  Generalized abdominal pain.  History of thrombocytopenia.  COMPLETE ABDOMINAL ULTRASOUND  Comparison:  No priors.  Findings:  Gallbladder:  No shadowing gallstones or echogenic sludge.  No gallbladder wall thickening or pericholecystic fluid.  Negative sonographic Murphy's sign according to the ultrasound technologist.  Common bile duct:  4 mm within the porta hepatis.  Liver:  Normal size and echotexture without focal parenchymal abnormality.  Patent portal vein with hepatopetal flow.  IVC:  Patent throughout its visualized course in the abdomen.  Pancreas:  Although the pancreas is difficult to visualize in its entirety, no focal pancreatic abnormality is identified.  Spleen:  Normal size and echotexture without focal parenchymal abnormality.  Right Kidney:  No hydronephrosis.  Well-preserved cortex.  Normal size and parenchymal echotexture without focal abnormalities.  Left Kidney:  No hydronephrosis.  Well-preserved cortex.  Normal size and parenchymal echotexture without focal abnormalities.  Abdominal aorta:  Cannot be visualized secondary to overlying bowel gas.  Additional  findings:  Bilateral pleural effusions.  IMPRESSION: 1.  No acute abnormality in the abdomen to account for the patient's symptoms. 2.  Bilateral pleural effusions.  Original Report Authenticated By: Florencia Reasons, M.D.     [facility]  Calculation: Score not calculated

## 2012-01-18 NOTE — Assessment & Plan Note (Signed)
Blood pressure control has been quite good and should improve further as carvedilol and lisinopril doses are titrated upwards.

## 2012-01-18 NOTE — Progress Notes (Signed)
Patient ID: Lee Ramirez, male   DOB: November 21, 1921, 78 y.o.   MRN: [id number]  HPI: Scheduled return visit for this nice gentleman seen in my absence one week ago and referred for pharmacologic stress nuclear testing.  That study showed a sizable area of significant hypoperfusion in the distribution of the right coronary artery.  The left ventricle was moderately dilated with moderately to severely depressed LV systolic function.  Patient was thought to have congestive heart failure at his last visit and was started on minimal doses of carvedilol and Lasix.  A substantial diuresis resulted.  Weight has varied from 184-195 over the past 2 years with a mean of approximately 188.  It appears that his dry weight is considerably lower at present.  Prior to Admission medications   Medication Sig Start Date End Date Taking? Authorizing Provider  acetaminophen (TYLENOL) 500 MG tablet Take 1,000 mg by mouth every 6 (six) hours as needed. Pain   Yes Historical Provider, MD  albuterol (PROAIR HFA) 108 (90 BASE) MCG/ACT inhaler Inhale 2 puffs into the lungs every 6 (six) hours as needed. For shortness of breath   Yes Historical Provider, MD  allopurinol (ZYLOPRIM) 300 MG tablet Take 150 mg by mouth daily.    Yes Historical Provider, MD  carvedilol (COREG) 3.125 MG tablet Take 1 tablet (3.125 mg total) by mouth 2 (two) times daily with a meal. 01/11/12 01/10/13 Yes Jodelle Gross, NP  CRANBERRY PO Take 1 capsule by mouth daily.   Yes Historical Provider, MD  Ferrous Sulfate (IRON) 28 MG TABS Take 1 tablet by mouth daily.   Yes Historical Provider, MD  FLORINEF 0.1 MG tablet TAKE 1 TABLET BY MOUTH ONCE A DAY. 12/21/11  Yes Kathlen Brunswick, MD  furosemide (LASIX) 20 MG tablet Take 1 tablet (20 mg total) by mouth daily. 01/11/12 01/10/13 Yes Jodelle Gross, NP  meclizine (ANTIVERT) 12.5 MG tablet Take 12.5 mg by mouth 3 (three) times daily as needed. Dizziness   Yes Historical Provider, MD  Multiple  Vitamins-Minerals (MULTIVITAMIN WITH MINERALS) tablet Take 1 tablet by mouth daily.    Yes Historical Provider, MD  nitroGLYCERIN (NITROSTAT) 0.4 MG SL tablet Place 1 tablet (0.4 mg total) under the tongue every 5 (five) minutes as needed. 12/29/10  Yes Tonny Bollman, MD  omeprazole (PRILOSEC) 20 MG capsule Take 20 mg by mouth daily.   Yes Historical Provider, MD  ondansetron (ZOFRAN) 4 MG tablet Take 4 mg by mouth every 6 (six) hours.   Yes Historical Provider, MD  potassium chloride (MICRO-K) 10 MEQ CR capsule Take 1 capsule (10 mEq total) by mouth daily. 01/11/12 01/10/13 Yes Jodelle Gross, NP  PRAVACHOL 40 MG tablet TAKE (1) TABLET BY MOUTH AT BEDTIME FOR CHOLESTEROL. 11/16/11  Yes Kathlen Brunswick, MD  Travoprost, BAK Free, (TRAVATAN) 0.004 % SOLN ophthalmic solution Place 1 drop into both eyes at bedtime.    Yes Historical Provider, MD  VITAMIN B1-B12 IJ Inject as directed.   Yes Historical Provider, MD  zolpidem (AMBIEN) 10 MG tablet Take 5 mg by mouth at bedtime as needed. For sleep   Yes Historical Provider, MD   Allergies  Allergen Reactions  . Aspirin Anaphylaxis     Past medical history, social history, and family history reviewed and updated.  ROS: Denies orthopnea, PND, palpitations, lightheadedness or syncope.  Edema and dyspnea have decreased.  All other systems reviewed and are negative.  PHYSICAL EXAM: BP 124/84  Pulse 87  Ht 6'  2" (1.88 m)  Wt 81.647 kg (180 lb)  BMI 23.11 kg/m2  SpO2 95%; weight decreased 12 pounds since last visit  General-Well developed; no acute distress; marked pallor; mentally alert and appropriate Body habitus-proportionate weight and height Neck-No JVD; no carotid bruits Lungs-clear lung fields; resonant to percussion Cardiovascular-normal PMI; normal S1 and Increased intensity of S2 Abdomen-normal bowel sounds; soft and non-tender without masses or organomegaly Musculoskeletal-No deformities, no cyanosis or clubbing Neurologic-Normal  cranial nerves; symmetric strength and tone Skin-Warm, no significant lesions Extremities-distal pulses intact; 1+ ankle edema  ASSESSMENT AND PLAN:  [place] Bing, MD 01/18/2012 2:42 PM

## 2012-01-18 NOTE — Assessment & Plan Note (Signed)
No orthostatic change in blood pressure at this visit.

## 2012-01-18 NOTE — Assessment & Plan Note (Signed)
Stress test negative for ischemia but verifies the presence of a moderate to severe ischemic cardiomyopathy.  Medications will be adjusted accordingly with the addition of ACE inhibitor and up titration of carvedilol.  He has had an excellent response to low-dose diuretic, which will be continued.  Electrolytes and renal function will be monitored and patient followed closely in the office.

## 2012-01-21 ENCOUNTER — Encounter (HOSPITAL_COMMUNITY): Payer: Medicare Other | Attending: Oncology

## 2012-01-21 VITALS — BP 132/79 | HR 80

## 2012-01-21 DIAGNOSIS — D539 Nutritional anemia, unspecified: Secondary | ICD-10-CM

## 2012-01-21 DIAGNOSIS — E538 Deficiency of other specified B group vitamins: Secondary | ICD-10-CM

## 2012-01-21 DIAGNOSIS — D696 Thrombocytopenia, unspecified: Secondary | ICD-10-CM

## 2012-01-21 DIAGNOSIS — D6959 Other secondary thrombocytopenia: Secondary | ICD-10-CM | POA: Insufficient documentation

## 2012-01-21 MED ORDER — CYANOCOBALAMIN 1000 MCG/ML IJ SOLN
INTRAMUSCULAR | Status: AC
  Filled 2012-01-21: qty 1

## 2012-01-21 MED ORDER — CYANOCOBALAMIN 1000 MCG/ML IJ SOLN
1000.0000 ug | Freq: Once | INTRAMUSCULAR | Status: AC
  Administered 2012-01-21: 1000 ug via INTRAMUSCULAR

## 2012-01-21 NOTE — Progress Notes (Signed)
Tolerated injection well. 

## 2012-01-28 ENCOUNTER — Encounter (HOSPITAL_BASED_OUTPATIENT_CLINIC_OR_DEPARTMENT_OTHER): Payer: Medicare Other

## 2012-01-28 VITALS — BP 120/69 | HR 62

## 2012-01-28 DIAGNOSIS — E538 Deficiency of other specified B group vitamins: Secondary | ICD-10-CM

## 2012-01-28 DIAGNOSIS — D539 Nutritional anemia, unspecified: Secondary | ICD-10-CM

## 2012-01-28 DIAGNOSIS — D696 Thrombocytopenia, unspecified: Secondary | ICD-10-CM

## 2012-01-28 MED ORDER — CYANOCOBALAMIN 1000 MCG/ML IJ SOLN
INTRAMUSCULAR | Status: AC
  Filled 2012-01-28: qty 1

## 2012-01-28 MED ORDER — CYANOCOBALAMIN 1000 MCG/ML IJ SOLN
1000.0000 ug | Freq: Once | INTRAMUSCULAR | Status: AC
  Administered 2012-01-28: 1000 ug via INTRAMUSCULAR

## 2012-01-28 NOTE — Progress Notes (Signed)
Lee Ramirez presents today for injection per MD orders. B12 1000 mcg administered IM in right Upper Arm. Administration without incident. Patient tolerated well.  

## 2012-02-05 ENCOUNTER — Encounter (HOSPITAL_BASED_OUTPATIENT_CLINIC_OR_DEPARTMENT_OTHER): Payer: Medicare Other

## 2012-02-05 DIAGNOSIS — D6959 Other secondary thrombocytopenia: Secondary | ICD-10-CM

## 2012-02-05 DIAGNOSIS — D539 Nutritional anemia, unspecified: Secondary | ICD-10-CM

## 2012-02-05 DIAGNOSIS — D696 Thrombocytopenia, unspecified: Secondary | ICD-10-CM

## 2012-02-05 LAB — CBC
MCH: 36 pg — ABNORMAL HIGH (ref 26.0–34.0)
MCHC: 33.2 g/dL (ref 30.0–36.0)
Platelets: 102 10*3/uL — ABNORMAL LOW (ref 150–400)
RDW: 15.1 % (ref 11.5–15.5)

## 2012-02-05 LAB — DIFFERENTIAL
Basophils Absolute: 0.1 10*3/uL (ref 0.0–0.1)
Basophils Relative: 1 % (ref 0–1)
Eosinophils Absolute: 0.4 10*3/uL (ref 0.0–0.7)
Neutrophils Relative %: 45 % (ref 43–77)
Smear Review: DECREASED

## 2012-02-05 NOTE — Progress Notes (Signed)
Labs drawn today for cbc/diff 

## 2012-02-08 ENCOUNTER — Other Ambulatory Visit: Payer: Self-pay | Admitting: *Deleted

## 2012-02-08 DIAGNOSIS — I10 Essential (primary) hypertension: Secondary | ICD-10-CM

## 2012-02-11 ENCOUNTER — Other Ambulatory Visit (HOSPITAL_COMMUNITY): Payer: Medicare Other

## 2012-02-11 ENCOUNTER — Encounter (HOSPITAL_COMMUNITY): Payer: Medicare Other

## 2012-02-11 ENCOUNTER — Other Ambulatory Visit (HOSPITAL_COMMUNITY): Payer: Self-pay | Admitting: Oncology

## 2012-02-11 ENCOUNTER — Ambulatory Visit (HOSPITAL_COMMUNITY): Payer: Medicare Other | Admitting: Oncology

## 2012-02-11 DIAGNOSIS — D539 Nutritional anemia, unspecified: Secondary | ICD-10-CM

## 2012-02-11 DIAGNOSIS — D696 Thrombocytopenia, unspecified: Secondary | ICD-10-CM

## 2012-02-11 LAB — CBC
HCT: 31 % — ABNORMAL LOW (ref 39.0–52.0)
Hemoglobin: 10.4 g/dL — ABNORMAL LOW (ref 13.0–17.0)
MCV: 106.5 fL — ABNORMAL HIGH (ref 78.0–100.0)
WBC: 4.8 10*3/uL (ref 4.0–10.5)

## 2012-02-11 LAB — DIFFERENTIAL
Lymphocytes Relative: 38 % (ref 12–46)
Lymphs Abs: 1.8 10*3/uL (ref 0.7–4.0)
Monocytes Absolute: 0.3 10*3/uL (ref 0.1–1.0)
Monocytes Relative: 6 % (ref 3–12)
Neutro Abs: 2.3 10*3/uL (ref 1.7–7.7)

## 2012-02-11 LAB — VITAMIN B12: Vitamin B-12: 1015 pg/mL — ABNORMAL HIGH (ref 211–911)

## 2012-02-11 LAB — FOLATE: Folate: >20 ng/mL

## 2012-02-12 ENCOUNTER — Encounter (HOSPITAL_BASED_OUTPATIENT_CLINIC_OR_DEPARTMENT_OTHER): Payer: Medicare Other | Admitting: Oncology

## 2012-02-12 ENCOUNTER — Encounter (HOSPITAL_COMMUNITY): Payer: Self-pay | Admitting: Oncology

## 2012-02-12 ENCOUNTER — Telehealth (HOSPITAL_COMMUNITY): Payer: Self-pay | Admitting: *Deleted

## 2012-02-12 VITALS — BP 130/69 | HR 67 | Temp 96.8°F | Wt 176.8 lb

## 2012-02-12 DIAGNOSIS — D696 Thrombocytopenia, unspecified: Secondary | ICD-10-CM

## 2012-02-12 DIAGNOSIS — D649 Anemia, unspecified: Secondary | ICD-10-CM

## 2012-02-12 DIAGNOSIS — D539 Nutritional anemia, unspecified: Secondary | ICD-10-CM

## 2012-02-12 NOTE — Patient Instructions (Signed)
[facility]  Discharge Instructions  RECOMMENDATIONS MADE BY THE CONSULTANT AND ANY TEST RESULTS WILL BE SENT TO YOUR REFERRING DOCTOR.   EXAM FINDINGS BY MD TODAY AND SIGNS AND SYMPTOMS TO REPORT TO CLINIC OR PRIMARY MD:  Bone marrow biopsy on 02/19/2012, be here at 0815  MEDICATIONS PRESCRIBED: you may take meds but do not eat          I acknowledge that I have been informed and understand all the instructions given to me and received a copy. I do not have any more questions at this time, but understand that I may call the Specialty Clinic at [facility] at [phone] during business hours should I have any further questions or need assistance in obtaining follow-up care.    __________________________________________  _____________  __________ Signature of Patient or Authorized Representative            Date                   Time    __________________________________________ Nurse's Signature

## 2012-02-12 NOTE — Progress Notes (Signed)
Problem #1 anemia and thrombocytopenia with a negative workup thus far other than a borderline B12 level with no response to 6 B12 injections. Therefore we will do a bone marrow aspirate and biopsy next week with flow cytometry if indicated and cytogenetic for certain. He is agreeable to this. His daughter was with him today and she will bring him to the procedure. He can take his medications before he comes and kidney after the procedure. He is 6 foot 2 hasn't lost any weight but may have a borderline enlargement of the spleen. I am awaiting the radiologist's call me back. He did have fatty infiltration of his liver but his fluid cannulations definitely diminished. His weight was 193 pounds and now son and 76 pounds. It is bone marrow biopsy is negative we may have to re-look at his spleen size with a formal CT scan of his abdomen

## 2012-02-12 NOTE — Telephone Encounter (Signed)
Scheduled bone marrow for 02/19/2012 with Lorrie @ [facility]. For flow and cyto

## 2012-02-19 ENCOUNTER — Encounter (HOSPITAL_COMMUNITY): Payer: Medicare Other | Attending: Oncology | Admitting: Oncology

## 2012-02-19 VITALS — BP 124/69 | HR 64 | Temp 95.1°F

## 2012-02-19 DIAGNOSIS — D696 Thrombocytopenia, unspecified: Secondary | ICD-10-CM

## 2012-02-19 DIAGNOSIS — D46C Myelodysplastic syndrome with isolated del(5q) chromosomal abnormality: Secondary | ICD-10-CM | POA: Insufficient documentation

## 2012-02-19 DIAGNOSIS — D6959 Other secondary thrombocytopenia: Secondary | ICD-10-CM | POA: Insufficient documentation

## 2012-02-19 HISTORY — PX: BONE MARROW ASPIRATION: SHX1252

## 2012-02-19 HISTORY — PX: BONE MARROW BIOPSY: SHX199

## 2012-02-19 LAB — CBC
HCT: 30.9 % — ABNORMAL LOW (ref 39.0–52.0)
Hemoglobin: 10.4 g/dL — ABNORMAL LOW (ref 13.0–17.0)
MCV: 105.8 fL — ABNORMAL HIGH (ref 78.0–100.0)
RBC: 2.92 MIL/uL — ABNORMAL LOW (ref 4.22–5.81)
WBC: 5.7 10*3/uL (ref 4.0–10.5)

## 2012-02-19 LAB — DIFFERENTIAL
Eosinophils Relative: 6 % — ABNORMAL HIGH (ref 0–5)
Lymphocytes Relative: 41 % (ref 12–46)
Lymphs Abs: 2.3 10*3/uL (ref 0.7–4.0)
Monocytes Absolute: 0.4 10*3/uL (ref 0.1–1.0)
Monocytes Relative: 7 % (ref 3–12)

## 2012-02-19 NOTE — Progress Notes (Signed)
[facility] BONE MARROW BIOPSY/ASPIRATE PROGRESS NOTE  Lee Ramirez presents for Bone Marrow biopsy per MD orders. Lee Ramirez verbalized understanding of procedure. Consent reviewed and signed.  Lee Ramirez positioned supine for procedure. Time-out performed and Bone Marrow Checklist. Procedure began at 0840. Xylocaine 2% 10 cc used for local and administered to patient by Dr. Mariel Sleet. Procedure completed at 0850. Patient tolerated well. Pressure dressing applied to the left hip with instructions to leave in place for 24 hours. Patient instructed to report any bleeding that saturates dressing and to take pain medication Tylenol as directed. Dressing dry and intact to the left hip on discharge.        Lee Ramirez presented for labwork. Labs per MD order drawn via Peripheral Line 23 gauge needle inserted in left AC  Good blood return present. Procedure without incident.  Needle removed intact. Patient tolerated procedure well.

## 2012-02-19 NOTE — Progress Notes (Signed)
Problem number 1 pancytopenia  The patient is here for a bone marrow aspirate and biopsy. After informed consent he was placed in the prone position and both posterior superior iliac spine his processes were identified in the left was chosen. He was cleansed with 3 Betadine swabs and anesthetized with 9 cc of 2% plain Xylocaine and a bone marrow aspirate for routine evaluation as well as flow cytometry and cytogenetics was obtained without incident and a biopsy as well was performed without incident.

## 2012-02-19 NOTE — Patient Instructions (Addendum)
Lee Ramirez  [id number] 18-Feb-1922 Dr. Glenford Peers   [facility]  Discharge Instructions  RECOMMENDATIONS MADE BY THE CONSULTANT AND ANY TEST RESULTS WILL BE SENT TO YOUR REFERRING DOCTOR.   EXAM FINDINGS BY MD TODAY AND SIGNS AND SYMPTOMS TO REPORT TO CLINIC OR PRIMARY MD: You had a bone marrow biopsy and aspiration of your left hip. Rest and keep dressing dry and intact for 24 - 48 hours. If sight starts to bleed, apply direct pressure to site for 5 - 10 minutes.  If unable to stop bleeding go to the ED.  MEDICATIONS PRESCRIBED: Tylenol for discomfort if needed.      SPECIAL INSTRUCTIONS/FOLLOW-UP: Return to Clinic in 2 weeks.   I acknowledge that I have been informed and understand all the instructions given to me and received a copy. I do not have any more questions at this time, but understand that I may call the Specialty Clinic at [facility] at [phone] during business hours should I have any further questions or need assistance in obtaining follow-up care.    __________________________________________  _____________  __________ Signature of Patient or Authorized Representative            Date                   Time    __________________________________________ Nurse's Signature

## 2012-03-04 ENCOUNTER — Other Ambulatory Visit: Payer: Self-pay | Admitting: *Deleted

## 2012-03-04 ENCOUNTER — Encounter: Payer: Self-pay | Admitting: *Deleted

## 2012-03-04 DIAGNOSIS — I10 Essential (primary) hypertension: Secondary | ICD-10-CM

## 2012-03-07 LAB — BASIC METABOLIC PANEL
BUN: 19 mg/dL (ref 6–23)
CO2: 27 mEq/L (ref 19–32)
Calcium: 9.5 mg/dL (ref 8.4–10.5)
Creat: 1.35 mg/dL (ref 0.50–1.35)
Glucose, Bld: 98 mg/dL (ref 70–99)
Sodium: 142 mEq/L (ref 135–145)

## 2012-03-11 ENCOUNTER — Ambulatory Visit (INDEPENDENT_AMBULATORY_CARE_PROVIDER_SITE_OTHER): Payer: Medicare Other | Admitting: Cardiology

## 2012-03-11 ENCOUNTER — Encounter: Payer: Self-pay | Admitting: Cardiology

## 2012-03-11 VITALS — BP 78/66 | HR 66 | Ht 74.0 in | Wt 176.1 lb

## 2012-03-11 DIAGNOSIS — D539 Nutritional anemia, unspecified: Secondary | ICD-10-CM

## 2012-03-11 DIAGNOSIS — D6959 Other secondary thrombocytopenia: Secondary | ICD-10-CM

## 2012-03-11 DIAGNOSIS — D696 Thrombocytopenia, unspecified: Secondary | ICD-10-CM

## 2012-03-11 DIAGNOSIS — I10 Essential (primary) hypertension: Secondary | ICD-10-CM

## 2012-03-11 DIAGNOSIS — I951 Orthostatic hypotension: Secondary | ICD-10-CM

## 2012-03-11 DIAGNOSIS — I2510 Atherosclerotic heart disease of native coronary artery without angina pectoris: Secondary | ICD-10-CM

## 2012-03-11 MED ORDER — LISINOPRIL 2.5 MG PO TABS: 2.5000 mg | ORAL_TABLET | Freq: Every day | ORAL | Status: DC

## 2012-03-11 MED ORDER — FUROSEMIDE 20 MG PO TABS: 10.0000 mg | ORAL_TABLET | Freq: Every day | ORAL | Status: DC

## 2012-03-11 MED ORDER — CARVEDILOL 12.5 MG PO TABS: 6.2500 mg | ORAL_TABLET | Freq: Two times a day (BID) | ORAL | Status: DC

## 2012-03-11 NOTE — Assessment & Plan Note (Signed)
Patient appears stable with respect to coronary disease and cardiomyopathy.  CHF is compensated; however, symptomatic orthostatic hypotension has resulted.  Diuretic, ACE inhibitor and beta blocker doses will be reduced by 50%.  The patient's daughter will monitor her weight and blood pressure at home reporting any abnormalities.  I will reassess him in a short interval in 7-10 days.

## 2012-03-11 NOTE — Assessment & Plan Note (Signed)
Onset with initiation of therapy for ischemic cardiomyopathy.  We will continue to adjust medications to achieve control of CHF without causing asymptomatic decline in blood pressure.

## 2012-03-11 NOTE — Progress Notes (Signed)
Patient ID: Lee Ramirez, male   DOB: Jan 30, 1922, 78 y.o.   MRN: [id number]  HPI: Scheduled return visit for this very nice gentleman with cardiomyopathy, likely ischemic based upon a recent stress nuclear study.  Initially, he did well with treatment, noting resolution of pedal edema.  Subsequently, he has developed weakness when he stands and walks with an unstable gait.  He has been able to steady himself with a cane and has not fallen.    Prior to Admission medications   Medication Sig Start Date End Date Taking? Authorizing Provider  acetaminophen (TYLENOL) 500 MG tablet Take 1,000 mg by mouth every 6 (six) hours as needed. Pain   Yes Historical Provider, MD  albuterol (PROAIR HFA) 108 (90 BASE) MCG/ACT inhaler Inhale 2 puffs into the lungs every 6 (six) hours as needed. For shortness of breath   Yes Historical Provider, MD  allopurinol (ZYLOPRIM) 300 MG tablet Take 150 mg by mouth daily.    Yes Historical Provider, MD  carvedilol (COREG) 12.5 MG tablet Take 0.5 tablets (6.25 mg total) by mouth 2 (two) times daily with a meal. 03/11/12 03/11/13 Yes Kathlen Brunswick, MD  CRANBERRY PO Take 1 capsule by mouth daily.   Yes Historical Provider, MD  furosemide (LASIX) 20 MG tablet Take 0.5 tablets (10 mg total) by mouth daily. 03/11/12 03/11/13 Yes Kathlen Brunswick, MD  lisinopril (PRINIVIL,ZESTRIL) 2.5 MG tablet Take 1 tablet (2.5 mg total) by mouth daily. 03/11/12 03/11/13 Yes Kathlen Brunswick, MD  meclizine (ANTIVERT) 12.5 MG tablet Take 12.5 mg by mouth 3 (three) times daily as needed. Dizziness   Yes Historical Provider, MD  Multiple Vitamins-Iron (DAILY-VITAMIN/IRON PO) Take 1 tablet by mouth daily.   Yes Historical Provider, MD  Multiple Vitamins-Minerals (MULTIVITAMIN WITH MINERALS) tablet Take 1 tablet by mouth daily.    Yes Historical Provider, MD  nitroGLYCERIN (NITROSTAT) 0.4 MG SL tablet Place 1 tablet (0.4 mg total) under the tongue every 5 (five) minutes as needed. 12/29/10  Yes Tonny Bollman, MD  omeprazole (PRILOSEC) 20 MG capsule Take 20 mg by mouth daily.   Yes Historical Provider, MD  ondansetron (ZOFRAN) 4 MG tablet Take 4 mg by mouth every 6 (six) hours.   Yes Historical Provider, MD  potassium chloride (MICRO-K) 10 MEQ CR capsule Take 1 capsule (10 mEq total) by mouth daily. 01/11/12 01/10/13 Yes Jodelle Gross, NP  PRAVACHOL 40 MG tablet TAKE (1) TABLET BY MOUTH AT BEDTIME FOR CHOLESTEROL. 11/16/11  Yes Kathlen Brunswick, MD  Travoprost, BAK Free, (TRAVATAN) 0.004 % SOLN ophthalmic solution Place 1 drop into both eyes at bedtime.    Yes Historical Provider, MD  zolpidem (AMBIEN) 10 MG tablet Take 5 mg by mouth at bedtime as needed. For sleep   Yes Historical Provider, MD   Allergies  Allergen Reactions  . Aspirin Anaphylaxis     Past medical history, social history, and family history reviewed and updated.  ROS: Denies dyspnea, chest discomfort, orthopnea, PND, pedal edema and syncope.  Weights have been monitored at home and had been stable.  He has no vertigo or other neurologic symptoms.  All other systems reviewed and are negative.  PHYSICAL EXAM: BP 78/66 (Standing); 108/68 (lying)  Pulse 66  Ht 6\' 2"  (1.88 m)  Wt 79.888 kg (176 lb 1.9 oz)  BMI 22.61 kg/m2; 30 mmHg orthostatic change in systolic blood pressure with usual symptoms elicited by standing. General-Well developed; no acute distress Body habitus-thin Neck-No JVD; no carotid bruits  Lungs-clear lung fields; resonant to percussion Cardiovascular-normal PMI; normal S1 and S2; no third heart sound; modest systolic murmur Abdomen-normal bowel sounds; soft and non-tender without masses or organomegaly Musculoskeletal-No deformities, no cyanosis or clubbing Neurologic-Normal cranial nerves; symmetric strength and tone Skin-Warm, no significant lesions Extremities-distal pulses intact; no edema  ASSESSMENT AND PLAN:  Klamath Bing, MD 03/11/2012 4:34 PM

## 2012-03-11 NOTE — Assessment & Plan Note (Signed)
Dr. Mariel Sleet contacted.  Bone marrow examination revealed a treatable cytologic abnormality.  Therapy with a new drug will be initiated at his next visit.

## 2012-03-11 NOTE — Patient Instructions (Addendum)
Your physician recommends that you schedule a follow-up appointment in: 7 - 10 days  Limit activity until dizziness and weakness have subsided  Daily weights and call us if weight increases > 5 pounds  Your physician has recommended you make the following change in your medication:  1 - DECREASE Lasix to 10 daily 2 - DECREASE Coreg to 6.25 mg twice a day 3 - DECREASE Lisinopril to 2.5 mg daily

## 2012-03-11 NOTE — Progress Notes (Deleted)
Name: Lee Ramirez    DOB: [birth year]  Age: 78 y.o.  MR#: [id number]       PCP:  [name], MD      Insurance: @PAYORNAME @   CC:   No chief complaint on file.   VS BP 78/66  Pulse 66  Ht 6\' 2"  (1.88 m)  Wt 176 lb 1.9 oz (79.888 kg)  BMI 22.61 kg/m2  Weights Current Weight  03/11/12 176 lb 1.9 oz (79.888 kg)  02/12/12 176 lb 12.8 oz (80.196 kg)  01/18/12 180 lb (81.647 kg)    Blood Pressure  BP Readings from Last 3 Encounters:  03/11/12 78/66  02/19/12 124/69  02/12/12 130/69     Admit date:  (Not on file) Last encounter with RMR:  01/18/2012   Allergy Allergies  Allergen Reactions  . Aspirin Anaphylaxis    Current Outpatient Prescriptions  Medication Sig Dispense Refill  . acetaminophen (TYLENOL) 500 MG tablet Take 1,000 mg by mouth every 6 (six) hours as needed. Pain      . albuterol (PROAIR HFA) 108 (90 BASE) MCG/ACT inhaler Inhale 2 puffs into the lungs every 6 (six) hours as needed. For shortness of breath      . allopurinol (ZYLOPRIM) 300 MG tablet Take 150 mg by mouth daily.       . carvedilol (COREG) 12.5 MG tablet Take 1 tablet (12.5 mg total) by mouth 2 (two) times daily with a meal.  60 tablet  12  . CRANBERRY PO Take 1 capsule by mouth daily.      . furosemide (LASIX) 20 MG tablet Take 1 tablet (20 mg total) by mouth daily.  30 tablet  12  . lisinopril (PRINIVIL,ZESTRIL) 5 MG tablet Take 1 tablet (5 mg total) by mouth daily.  30 tablet  12  . meclizine (ANTIVERT) 12.5 MG tablet Take 12.5 mg by mouth 3 (three) times daily as needed. Dizziness      . Multiple Vitamins-Iron (DAILY-VITAMIN/IRON PO) Take 1 tablet by mouth daily.      . Multiple Vitamins-Minerals (MULTIVITAMIN WITH MINERALS) tablet Take 1 tablet by mouth daily.       . nitroGLYCERIN (NITROSTAT) 0.4 MG SL tablet Place 1 tablet (0.4 mg total) under the tongue every 5 (five) minutes as needed.  25 tablet  10  . omeprazole (PRILOSEC) 20 MG capsule Take 20 mg by mouth daily.      .  ondansetron (ZOFRAN) 4 MG tablet Take 4 mg by mouth every 6 (six) hours.      . potassium chloride (MICRO-K) 10 MEQ CR capsule Take 1 capsule (10 mEq total) by mouth daily.  30 capsule  12  . PRAVACHOL 40 MG tablet TAKE (1) TABLET BY MOUTH AT BEDTIME FOR CHOLESTEROL.  30 each  6  . Travoprost, BAK Free, (TRAVATAN) 0.004 % SOLN ophthalmic solution Place 1 drop into both eyes at bedtime.       Marland Kitchen zolpidem (AMBIEN) 10 MG tablet Take 5 mg by mouth at bedtime as needed. For sleep        Discontinued Meds:    Medications Discontinued During This Encounter  Medication Reason  . VITAMIN B1-B12 IJ Error  . Ferrous Sulfate (IRON) 28 MG TABS Error    Patient Active Problem List  Diagnosis  . LEFT BUNDLE BRANCH BLOCK  . ATHEROSCLEROTIC CARDIOVASCULAR DISEASE  . Thrombocytopenia, acquired  . Hyperlipidemia  . Hypertension  . Orthostatic hypotension  . Anemia, macrocytic  . Insomnia  . GERD (gastroesophageal reflux disease)  LABS Orders Only on 03/04/2012  Component Date Value  . Sodium 03/07/2012 142   . Potassium 03/07/2012 4.4   . Chloride 03/07/2012 107   . CO2 03/07/2012 27   . Glucose, Bld 03/07/2012 98   . BUN 03/07/2012 19   . Creat 03/07/2012 1.35   . Calcium 03/07/2012 9.5   Office Visit on 02/19/2012  Component Date Value  . WBC 02/19/2012 5.7   . RBC 02/19/2012 2.92*  . Hemoglobin 02/19/2012 10.4*  . HCT 02/19/2012 30.9*  . MCV 02/19/2012 105.8*  . MCH 02/19/2012 35.6*  . MCHC 02/19/2012 33.7   . RDW 02/19/2012 14.6   . Platelets 02/19/2012 98*  . Neutrophils Relative 02/19/2012 45   . Neutro Abs 02/19/2012 2.6   . Lymphocytes Relative 02/19/2012 41   . Lymphs Abs 02/19/2012 2.3   . Monocytes Relative 02/19/2012 7   . Monocytes Absolute 02/19/2012 0.4   . Eosinophils Relative 02/19/2012 6*  . Eosinophils Absolute 02/19/2012 0.3   . Basophils Relative 02/19/2012 1   . Basophils Absolute 02/19/2012 0.1   Orders Only on 02/11/2012  Component Date Value  . WBC  02/11/2012 4.8   . RBC 02/11/2012 2.91*  . Hemoglobin 02/11/2012 10.4*  . HCT 02/11/2012 31.0*  . MCV 02/11/2012 106.5*  . [facility] 02/11/2012 35.7*  . MCHC 02/11/2012 33.5   . RDW 02/11/2012 14.9   . Platelets 02/11/2012 85*  . Neutrophils Relative 02/11/2012 48   . Neutro Abs 02/11/2012 2.3   . Lymphocytes Relative 02/11/2012 38   . Lymphs Abs 02/11/2012 1.8   . Monocytes Relative 02/11/2012 6   . Monocytes Absolute 02/11/2012 0.3   . Eosinophils Relative 02/11/2012 7*  . Eosinophils Absolute 02/11/2012 0.3   . Basophils Relative 02/11/2012 1   . Basophils Absolute 02/11/2012 0.0   . Vitamin B-12 02/11/2012 1015*  . Folate 02/11/2012 >20.0   . Iron 02/11/2012 119   . TIBC 02/11/2012 287   . Saturation Ratios 02/11/2012 41   . UIBC 02/11/2012 168   . Ferritin 02/11/2012 130   Infusion on 02/05/2012  Component Date Value  . WBC 02/05/2012 5.0   . RBC 02/05/2012 3.00*  . Hemoglobin 02/05/2012 10.8*  . HCT 02/05/2012 32.5*  . MCV 02/05/2012 108.3*  . MCH 02/05/2012 36.0*  . MCHC 02/05/2012 33.2   . RDW 02/05/2012 15.1   . Platelets 02/05/2012 102*  . Neutrophils Relative 02/05/2012 45   . Neutro Abs 02/05/2012 2.3   . Lymphocytes Relative 02/05/2012 41   . Lymphs Abs 02/05/2012 2.1   . Monocytes Relative 02/05/2012 6   . Monocytes Absolute 02/05/2012 0.3   . Eosinophils Relative 02/05/2012 7*  . Eosinophils Absolute 02/05/2012 0.4   . Basophils Relative 02/05/2012 1   . Basophils Absolute 02/05/2012 0.1   . WBC Morphology 02/05/2012 ATYPICAL LYMPHOCYTES   . Smear Review 02/05/2012 PLATELETS APPEAR DECREASED   Orders Only on 01/11/2012  Component Date Value  . Sodium 01/11/2012 143   . Potassium 01/11/2012 4.6   . Chloride 01/11/2012 107   . CO2 01/11/2012 28   . Glucose, Bld 01/11/2012 93   . BUN 01/11/2012 15   . Creat 01/11/2012 1.65*  . Calcium 01/11/2012 9.2   . Brain Natriuretic Peptide 01/11/2012 756.2*  Office Visit on 01/07/2012  Component Date Value  .  WBC 01/07/2012 4.6   . RBC 01/07/2012 2.71*  . Hemoglobin 01/07/2012 9.7*  . HCT 01/07/2012 30.0*  . MCV 01/07/2012 110.7*  . [facility] 01/07/2012  35.8*  . MCHC 01/07/2012 32.3   . RDW 01/07/2012 16.1*  . Platelets 01/07/2012 78*  . Neutrophils Relative 01/07/2012 54   . Lymphocytes Relative 01/07/2012 35   . Monocytes Relative 01/07/2012 6   . Eosinophils Relative 01/07/2012 4   . Basophils Relative 01/07/2012 1   . Neutro Abs 01/07/2012 2.5   . Lymphs Abs 01/07/2012 1.6   . Monocytes Absolute 01/07/2012 0.3   . Eosinophils Absolute 01/07/2012 0.2   . Basophils Absolute 01/07/2012 0.0   . WBC Morphology 01/07/2012 ATYPICAL LYMPHOCYTES   . Smear Review 01/07/2012 PLATELET COUNT CONFIRMED BY SMEAR   . Retic Ct Pct 01/07/2012 1.4   . RBC. 01/07/2012 2.71*  . Retic Count, Manual 01/07/2012 37.9   . Vitamin B-12 01/07/2012 344   . Folate 01/07/2012 >20.0   . Methylmalonic Acid, Quan* 01/07/2012 0.52*  Office Visit on 12/31/2011  Component Date Value  . Sodium 12/31/2011 142   . Potassium 12/31/2011 4.0   . Chloride 12/31/2011 105   . CO2 12/31/2011 29   . Glucose, Bld 12/31/2011 100*  . BUN 12/31/2011 11   . Creatinine, Ser 12/31/2011 1.23   . Calcium 12/31/2011 9.6   . Total Protein 12/31/2011 6.5   . Albumin 12/31/2011 3.7   . AST 12/31/2011 15   . ALT 12/31/2011 12   . Alkaline Phosphatase 12/31/2011 51   . Total Bilirubin 12/31/2011 1.1   . GFR calc non Af Amer 12/31/2011 51*  . GFR calc Af Amer 12/31/2011 59*  . Total Protein ELP 12/31/2011 5.8*  . Albumin ELP 12/31/2011 60.6   . Alpha-1-Globulin 12/31/2011 5.5*  . Alpha-2-Globulin 12/31/2011 11.3   . Beta Globulin 12/31/2011 5.8   . Beta 2 12/31/2011 3.3   . Gamma Globulin 12/31/2011 13.5   . M-Spike, % 12/31/2011 NOT DETECTED   . SPE Interp. 12/31/2011 (NOTE)   . Comment 12/31/2011 (NOTE)   . Total Protein ELP 12/31/2011 6.0   . IgG (Immunoglobin G), Se* 12/31/2011 860   . IgA 12/31/2011 66*  . IgM, Serum  12/31/2011 121   . Immunofix Electr Int 12/31/2011 (NOTE)   . DAT, complement 12/31/2011 NEG   . DAT, IgG 12/31/2011 NEG   . Haptoglobin 12/31/2011 85   . LDH 12/31/2011 225      Results for this Opt Visit:     Results for orders placed in visit on 03/04/12  BASIC METABOLIC PANEL      Component Value Range   Sodium 142  135 - 145 mEq/L   Potassium 4.4  3.5 - 5.3 mEq/L   Chloride 107  96 - 112 mEq/L   CO2 27  19 - 32 mEq/L   Glucose, Bld 98  70 - 99 mg/dL   BUN 19  6 - 23 mg/dL   Creat 9.60  4.54 - 0.98 mg/dL   Calcium 9.5  8.4 - 11.9 mg/dL    EKG Orders placed during the hospital encounter of 11/24/11  . EKG     Prior Assessment and Plan Problem List as of 03/11/2012            Cardiology Problems   LEFT BUNDLE BRANCH BLOCK   ATHEROSCLEROTIC CARDIOVASCULAR DISEASE   Last Assessment & Plan Note   01/18/2012 Office Visit Signed 01/18/2012  6:21 PM by Kathlen Brunswick, MD    Stress test negative for ischemia but verifies the presence of a moderate to severe ischemic cardiomyopathy.  Medications will be adjusted accordingly with the addition  of ACE inhibitor and up titration of carvedilol.  He has had an excellent response to low-dose diuretic, which will be continued.  Electrolytes and renal function will be monitored and patient followed closely in the office.    Hyperlipidemia   Last Assessment & Plan Note   11/15/2011 Office Visit Signed 11/16/2011  3:30 PM by Kathlen Brunswick, MD    Lipid profile was excellent one year ago except for elevated triglycerides.  We will continue to monitor effectiveness of lipid lowering therapy.    Hypertension   Last Assessment & Plan Note   01/18/2012 Office Visit Signed 01/18/2012  6:22 PM by Kathlen Brunswick, MD    Blood pressure control has been quite good and should improve further as carvedilol and lisinopril doses are titrated upwards.    Orthostatic hypotension   Last Assessment & Plan Note   01/18/2012 Office Visit Signed  01/18/2012  6:22 PM by Kathlen Brunswick, MD    No orthostatic change in blood pressure at this visit.      Other   Thrombocytopenia, acquired   Last Assessment & Plan Note   07/16/2011 Office Visit Signed 07/16/2011  7:29 PM by Kathlen Brunswick, MD    Mild thrombocytopenia is stable to improved.    Anemia, macrocytic   Last Assessment & Plan Note   01/18/2012 Office Visit Addendum 01/20/2012  2:03 PM by Kathlen Brunswick, MD    Dr. Mariel Sleet he is treating anemia with vitamin B injections despite conventionally normal serum level.  In light of the patient's advanced age, multiple medical problems and relatively mild and stable anemia, an empiric trial of a benign treatment appears appropriate.    Insomnia   GERD (gastroesophageal reflux disease)       Imaging: No results found.   FRS Calculation: Score not calculated

## 2012-03-14 ENCOUNTER — Encounter (HOSPITAL_COMMUNITY): Payer: Self-pay | Admitting: Oncology

## 2012-03-14 ENCOUNTER — Encounter (HOSPITAL_BASED_OUTPATIENT_CLINIC_OR_DEPARTMENT_OTHER): Payer: Medicare Other | Admitting: Oncology

## 2012-03-14 VITALS — BP 87/51 | HR 67 | Temp 97.2°F | Wt 175.1 lb

## 2012-03-14 DIAGNOSIS — D46C Myelodysplastic syndrome with isolated del(5q) chromosomal abnormality: Secondary | ICD-10-CM

## 2012-03-14 DIAGNOSIS — I959 Hypotension, unspecified: Secondary | ICD-10-CM

## 2012-03-14 DIAGNOSIS — R5381 Other malaise: Secondary | ICD-10-CM

## 2012-03-14 MED ORDER — LENALIDOMIDE 5 MG PO CAPS: ORAL_CAPSULE | ORAL | Status: DC

## 2012-03-14 NOTE — Patient Instructions (Addendum)
[facility]  Discharge Instructions  RECOMMENDATIONS MADE BY THE CONSULTANT AND ANY TEST RESULTS WILL BE SENT TO YOUR REFERRING DOCTOR.  DIONE MCCOMBIE  [id number] September 17, 1921 Dr. Milderd Meager FINDINGS BY MD TODAY AND SIGNS AND SYMPTOMS TO REPORT TO CLINIC OR PRIMARY MD:  We will work on getting you enrolled in the revlimid program  MEDICATIONS PRESCRIBED: Revlimid 5 mg po daily 21 days on 7 days off. Patient instructions given and we will also bring you in for teaching and start your labwork before you start the drug  INSTRUCTIONS GIVEN AND DISCUSSED: We will do labs every other week starting when you get the drug.  SPECIAL INSTRUCTIONS/FOLLOW-UP:    I acknowledge that I have been informed and understand all the instructions given to me and received a copy. I do not have any more questions at this time, but understand that I may call the Specialty Clinic at [facility] at [phone] during business hours should I have any further questions or need assistance in obtaining follow-up care.    __________________________________________  _____________  __________ Signature of Patient or Authorized Representative            Date                   Time    __________________________________________ Nurse's Signature

## 2012-03-14 NOTE — Progress Notes (Signed)
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 He is here today with his daughter to go over his bone marrow aspirate and biopsy findings. He clearly has the 5 q. minus picture and we are going to start Revlimid 5 mg daily for 21 days off for 7 days and repeat and we will try this for 3-6 months in the hopes of getting improvement in his cytopenias. He is very symptomatic weak and tired. Probably this is due to was hypotension which Dr. Dietrich Pates is working on presently. His blood pressure today is only 87/51 and earlier it was 78/66.  We went into the side effects of the drug. Based upon his age and creatinine clearance we will only start with 5 mg of the Revlimid but could reduce him to 2.5 mg if need be. We will do every other week blood counts and check his liver enzymes monthly and we will see him in 6 weeks. We spent the vast majority of the time just in counseling about this syndrome. I am very hopeful that he will  respond.

## 2012-03-14 NOTE — Progress Notes (Signed)
Need written Rx for revlimid to sent to [facility].

## 2012-03-17 ENCOUNTER — Telehealth (HOSPITAL_COMMUNITY): Payer: Self-pay | Admitting: Oncology

## 2012-03-17 NOTE — Telephone Encounter (Signed)
FYI!  Pt's copay is $2659.71 for Revlimid. Pt may qualify for copay assistance but it will take at least a week before we know for sure.  Thanks, [facility] [phone]

## 2012-03-19 DIAGNOSIS — D469 Myelodysplastic syndrome, unspecified: Secondary | ICD-10-CM | POA: Insufficient documentation

## 2012-03-19 NOTE — Patient Instructions (Addendum)
Lee Ramirez  [id number] 11/02/1921 Dr. Glenford Peers    CHEMOTHERAPY INSTRUCTIONS  Revlimid - used to treat MDS. This medication must be taken as directed. Take this medication with a glass of water. Do not cut, crush, or chew this medication. Take your medicine at regular intervals. Do not take it more often than directed. Side effects: risk of fetal harm, low white blood cell count, low platelets, blood clots in the extremities (arms/legs) or in the lungs, rash, dry skin, diarrhea or constipation, nausea, cough, shortness of breath, feeling tired, fever, swelling in the legs/feet, muscle aches, dizziness, headache, muscle cramps, upper respiratory tract infections.   We will be monitoring your labs monthly at the [facility].  EDUCATIONAL MATERIALS GIVEN AND REVIEWED: Care Notes on Revlimid given   SELF CARE ACTIVITIES WHILE ON CHEMOTHERAPY: Increase your fluid intake 48 hours prior to treatment and drink at least 2 quarts per day after treatment., No alcohol intake., No aspirin or other medications unless approved by your oncologist., Eat foods that are light and easy to digest., Eat foods at cold or room temperature., Have teeth cleaned professionally before starting treatment. Keep dentures and partial plates clean., Use soft toothbrush and do not use mouthwashes that contain alcohol. Biotene is a good mouthwash that is available at most pharmacies or may be ordered by calling [phone]., Use warm salt water gargles (1 teaspoon salt per 1 quart warm water) before and after meals and at bedtime. Or you may rinse with 2 tablespoons of three -percent hydrogen peroxide mixed in eight ounces of water., Always use sunscreen with SPF (Sun Protection Factor) of 30 or higher., Use your stool softener or laxative as directed to prevent constipation. and Use your anti-diarrheal medication as directed to stop diarrhea.  Please wash your hands for  at least 30 seconds using warm soapy water. Handwashing is the #1 way to prevent the spread of germs. Stay away from sick people or people who are getting over a cold. If you develop respiratory systems such as green/yellow mucus production or productive cough or persistent cough let us know and we will see if you need an antibiotic. It is a good idea to keep a pair of gloves on when going into grocery stores/[employer] to decrease your risk of coming into contact with germs on the carts, etc. Carry alcohol hand gel with you at all times and use it frequently if out in public. All foods need to be cooked thoroughly. No raw foods. No medium or undercooked meats, eggs. If your food is cooked medium well, it does not need to be hot pink or saturated with bloody liquid at all. Vegetables and fruits need to be washed/rinsed under the faucet with a dish detergent before being consumed. You can eat raw fruits and vegetables unless we tell you otherwise but it would be best if you cooked them or bought frozen. Do not eat off of salad bars or hot bars unless you really trust the cleanliness of the restaurant. If you need dental work, please let Dr. Mariel Sleet know before you go for your appointment so that we can coordinate the best possible time for you in regards to your chemo regimen. You need to also let your dentist know that you are actively taking chemo. We may need to do labs prior to your dental appointment. We also want your bowels moving at least every other day. If this is not happening, we need to  know so that we can get you on a bowel regimen to help you go.     MEDICATIONS: The following are OTC meds that you may take if needed:  Colace - this is a stool softener. Take 100mg  capsule 2-6 times a day as needed. If you have to take more than 6 capsules of Colace a day call the Cancer Center.  Senna - this is a mild laxative used to treat mild constipation. May take 2 tabs by mouth daily or up to twice a day  as needed for mild constipation.  Milk of Magnesia - this is a laxative used to treat moderate to severe constipation. May take 2-4 tablespoons every 8 hours as needed. May increase to 8 tablespoons x 1 dose and if no bowel movement call the Cancer Center.  Imodium - this is for diarrhea. Take 2 tabs after 1st loose stool and then 1 tab after each loose stool until you go a total of 12 hours without a loose stool. Call Cancer Center if loose stools continue.    SYMPTOMS TO REPORT AS SOON AS POSSIBLE AFTER TREATMENT:  FEVER GREATER THAN 100.5 F  CHILLS WITH OR WITHOUT FEVER  NAUSEA AND VOMITING THAT IS NOT CONTROLLED WITH YOUR NAUSEA MEDICATION  UNUSUAL SHORTNESS OF BREATH  UNUSUAL BRUISING OR BLEEDING  TENDERNESS IN MOUTH AND THROAT WITH OR WITHOUT PRESENCE OF ULCERS  URINARY PROBLEMS  BOWEL PROBLEMS  UNUSUAL RASH    Wear comfortable clothing and clothing appropriate for easy access to any Portacath or PICC line. Let us know if there is anything that we can do to make your therapy better!      I have been informed and understand all of the instructions given to me and have received a copy. I have been instructed to call the clinic [phone] or my family physician as soon as possible for continued medical care, if indicated. I do not have any more questions at this time but understand that I may call the Cancer Center or the Patient Navigator at [phone] during office hours should I have questions or need assistance in obtaining follow-up care.      _________________________________________      _______________     __________ Signature of Patient or Authorized Representative        Date                            Time      _________________________________________ Nurse's Signature

## 2012-03-19 NOTE — Progress Notes (Signed)
No Lee Ramirez has what she needs.

## 2012-03-20 ENCOUNTER — Encounter: Payer: Self-pay | Admitting: Adult Health

## 2012-03-20 ENCOUNTER — Other Ambulatory Visit (HOSPITAL_COMMUNITY): Payer: Self-pay | Admitting: *Deleted

## 2012-03-20 ENCOUNTER — Ambulatory Visit (INDEPENDENT_AMBULATORY_CARE_PROVIDER_SITE_OTHER): Payer: Medicare Other | Admitting: Adult Health

## 2012-03-20 VITALS — BP 93/50 | HR 68 | Resp 16 | Ht 74.0 in | Wt 176.8 lb

## 2012-03-20 DIAGNOSIS — D469 Myelodysplastic syndrome, unspecified: Secondary | ICD-10-CM

## 2012-03-20 DIAGNOSIS — I951 Orthostatic hypotension: Secondary | ICD-10-CM

## 2012-03-20 NOTE — Assessment & Plan Note (Signed)
Is symptomatically better with changes in medication doses, he remains orthostatic with blood pressure dropping from 127-96 systolically from lying to standing. He did have some dizziness. Heart rate was stable. We will not change any of his medications as he is essentially asymptomatic despite a blood pressure drop at this time. He is to followup with Dr. Dietrich Pates in approximately one month for continued evaluation. He is to avoid prolonged standing. He is to take his time getting from a sitting to standing position.

## 2012-03-20 NOTE — Patient Instructions (Addendum)
Your physician recommends that you schedule a follow-up appointment in: 1 month with Dr Dietrich Pates  Your physician recommends that you continue on your current medications as directed. Please refer to the Current Medication list given to you today.

## 2012-03-20 NOTE — Progress Notes (Signed)
HPI [name] is a 78 year old patient of Dr. Dietrich Pates we are following for ischemic cardiomyopathy. On last visit he was complaining of generalized weakness and fatigue he was found to be hypotensive. On that visit Dr. Dietrich Pates adjust his medications to decrease his Coreg to 6.25 mg twice a day (from 12.5 mg twice a day) and decrease his Lasix to 10 mg daily (from 20 mg daily) and decreased his Prinivil to 2.5 mg daily. He is also been seen by Dr. Mariel Sleet where he is followed for macrocytic anemia. He has recently been placed on lenalidomide.   He comes today feeling less fatigued and dizzy. His wife has been keeping up with the blood pressures and they are ranging at home in the 110 to 102 range. Highest recorded is 127 systolically. He denies any other symptoms.  Allergies  Allergen Reactions  . Aspirin Anaphylaxis    Current Outpatient Prescriptions  Medication Sig Dispense Refill  . acetaminophen (TYLENOL) 500 MG tablet Take 1,000 mg by mouth every 6 (six) hours as needed. Pain      . albuterol (PROAIR HFA) 108 (90 BASE) MCG/ACT inhaler Inhale 2 puffs into the lungs every 6 (six) hours as needed. For shortness of breath      . allopurinol (ZYLOPRIM) 300 MG tablet Take 150 mg by mouth daily.       . carvedilol (COREG) 12.5 MG tablet Take 0.5 tablets (6.25 mg total) by mouth 2 (two) times daily with a meal.  30 tablet  12  . CRANBERRY PO Take 1 capsule by mouth daily.      . furosemide (LASIX) 20 MG tablet Take 0.5 tablets (10 mg total) by mouth daily.  30 tablet  12  . lenalidomide (REVLIMID) 5 MG capsule One a day for 21 days, then 7 days off, and repeat  21 capsule  6  . lisinopril (PRINIVIL,ZESTRIL) 2.5 MG tablet Take 1 tablet (2.5 mg total) by mouth daily.  30 tablet  12  . meclizine (ANTIVERT) 12.5 MG tablet Take 12.5 mg by mouth 3 (three) times daily as needed. Dizziness      . Multiple Vitamins-Iron (DAILY-VITAMIN/IRON PO) Take 1 tablet by mouth daily.      . nitroGLYCERIN  (NITROSTAT) 0.4 MG SL tablet Place 1 tablet (0.4 mg total) under the tongue every 5 (five) minutes as needed.  25 tablet  10  . omeprazole (PRILOSEC) 20 MG capsule Take 20 mg by mouth daily.      . ondansetron (ZOFRAN) 4 MG tablet Take 4 mg by mouth every 6 (six) hours.      . potassium chloride (MICRO-K) 10 MEQ CR capsule Take 1 capsule (10 mEq total) by mouth daily.  30 capsule  12  . PRAVACHOL 40 MG tablet TAKE (1) TABLET BY MOUTH AT BEDTIME FOR CHOLESTEROL.  30 each  6  . Travoprost, BAK Free, (TRAVATAN) 0.004 % SOLN ophthalmic solution Place 1 drop into both eyes at bedtime.       Marland Kitchen zolpidem (AMBIEN) 10 MG tablet Take 5 mg by mouth at bedtime as needed. For sleep        Past Medical History  Diagnosis Date  . ASCVD (arteriosclerotic cardiovascular disease)     Sizable non-Q. myocardial infarction in 09/2009; severe three-vessel disease with an ejection fraction of 35%; three-vessel DES complicated by thrombocytopenia and mild renal insufficiency  . Hyperlipidemia     09/2010: TC-151, TG-411, H.-32, L.-not calculated   . Anemia     H/H -12.3/37.2;  MCV-103; platelets-114 (08/2010)   . 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 Hypertension   . Orthostatic hypotension     Lightheaded; no syncope  . Thrombocytopenia, acquired     09/2010-114,000  . Hepatic steatosis 2013  . Thrombocytopenia     referred to hem-onc 5/13    Past Surgical History  Procedure Date  . Inguinal hernia repair     Right  . Appendectomy   . Coronary angioplasty with stent placement   . Bone marrow biopsy 02/19/12  . Bone marrow aspiration 02/19/12    WGN:FAOZHY of systems complete and found to be negative unless listed above  PHYSICAL EXAM BP 93/50  Pulse 68  Resp 16  Ht 6\' 2"  (1.88 m)  Wt 176 lb 12 oz (80.173 kg)  BMI 22.69 kg/m2  General: Well developed, well nourished, in no acute distress Head: Eyes PERRLA, No xanthomas.Deep dark areas around the eyes,  with some puffiness. Skin is very pale.   Normal cephalic and atramatic  Lungs: Clear bilaterally to auscultation and percussion. Heart: HRRR S1 S2, without MRG distant heart sounds..  Pulses are 2+ & equal.            No carotid bruit. No JVD.  No abdominal bruits. No femoral bruits. Abdomen: Bowel sounds are positive, abdomen soft and non-tender without masses or                  Hernia's noted. Msk:  Back normal, normal gait. Normal strength and tone for age. Extremities: No clubbing, cyanosis or edema.  DP +1 Neuro: Alert and oriented X 3. Psych:  Good affect, responds appropriately   ASSESSMENT AND PLAN

## 2012-03-31 ENCOUNTER — Telehealth (HOSPITAL_COMMUNITY): Payer: Self-pay | Admitting: *Deleted

## 2012-03-31 NOTE — Telephone Encounter (Signed)
Tried to call patient to check on him since starting Revlimid but busy phone line.

## 2012-04-02 ENCOUNTER — Encounter: Payer: Self-pay | Admitting: Oncology

## 2012-04-04 ENCOUNTER — Other Ambulatory Visit (HOSPITAL_COMMUNITY): Payer: Self-pay | Admitting: *Deleted

## 2012-04-04 DIAGNOSIS — D469 Myelodysplastic syndrome, unspecified: Secondary | ICD-10-CM

## 2012-04-07 ENCOUNTER — Encounter (HOSPITAL_COMMUNITY): Payer: Self-pay | Admitting: Oncology

## 2012-04-07 ENCOUNTER — Encounter (HOSPITAL_COMMUNITY): Payer: Medicare Other | Attending: Oncology | Admitting: Oncology

## 2012-04-07 ENCOUNTER — Encounter (HOSPITAL_BASED_OUTPATIENT_CLINIC_OR_DEPARTMENT_OTHER): Payer: Medicare Other

## 2012-04-07 ENCOUNTER — Other Ambulatory Visit (HOSPITAL_COMMUNITY): Payer: Medicare Other

## 2012-04-07 VITALS — BP 107/52 | HR 68 | Temp 97.4°F | Resp 16 | Wt 178.9 lb

## 2012-04-07 DIAGNOSIS — D46C Myelodysplastic syndrome with isolated del(5q) chromosomal abnormality: Secondary | ICD-10-CM

## 2012-04-07 DIAGNOSIS — D649 Anemia, unspecified: Secondary | ICD-10-CM

## 2012-04-07 DIAGNOSIS — D469 Myelodysplastic syndrome, unspecified: Secondary | ICD-10-CM | POA: Insufficient documentation

## 2012-04-07 DIAGNOSIS — R5381 Other malaise: Secondary | ICD-10-CM

## 2012-04-07 LAB — DIFFERENTIAL
Basophils Relative: 1 % (ref 0–1)
Lymphocytes Relative: 44 % (ref 12–46)
Lymphs Abs: 2.1 10*3/uL (ref 0.7–4.0)
Monocytes Relative: 7 % (ref 3–12)
Neutro Abs: 1.8 10*3/uL (ref 1.7–7.7)
Neutrophils Relative %: 37 % — ABNORMAL LOW (ref 43–77)

## 2012-04-07 LAB — PREPARE RBC (CROSSMATCH)

## 2012-04-07 LAB — COMPREHENSIVE METABOLIC PANEL
ALT: 10 U/L (ref 0–53)
Albumin: 3.4 g/dL — ABNORMAL LOW (ref 3.5–5.2)
Alkaline Phosphatase: 59 U/L (ref 39–117)
Chloride: 104 mEq/L (ref 96–112)
Potassium: 3.9 mEq/L (ref 3.5–5.1)
Sodium: 139 mEq/L (ref 135–145)
Total Bilirubin: 0.8 mg/dL (ref 0.3–1.2)
Total Protein: 6.9 g/dL (ref 6.0–8.3)

## 2012-04-07 LAB — ABO/RH: ABO/RH(D): O POS

## 2012-04-07 LAB — CBC
Hemoglobin: 8.9 g/dL — ABNORMAL LOW (ref 13.0–17.0)
RBC: 2.38 MIL/uL — ABNORMAL LOW (ref 4.22–5.81)
WBC: 4.8 10*3/uL (ref 4.0–10.5)

## 2012-04-07 MED ORDER — LENALIDOMIDE 2.5 MG PO CAPS: ORAL_CAPSULE | ORAL | Status: DC

## 2012-04-07 NOTE — Progress Notes (Signed)
[name], MD [address] [place] [address]  1. MDS (myelodysplastic syndrome)  0.9 %  sodium chloride infusion, sodium chloride 0.9 % injection 10 mL, heparin lock flush 100 unit/mL, heparin lock flush 100 unit/mL, sodium chloride 0.9 % injection 3 mL, Prepare RBC, Transfuse RBC, furosemide (LASIX) injection 20 mg, acetaminophen (TYLENOL) tablet 650 mg, diphenhydrAMINE (BENADRYL) capsule 25 mg, Type and screen, CBC with Differential, Prepare RBC, lenalidomide (REVLIMID) 2.5 MG capsule    CURRENT THERAPY: On Revlimid 5 mg 21 days on and 7 days off.    INTERVAL HISTORY: Lee Ramirez 78 y.o. 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  He has 3 more days left of his Revlimid 5 mg to complete his 21 days on followed by a 7 day respite.  He reports that he has declined since starting the medication.  He is seen today in a wheelchair which is abnormal for Armen.  He explains that he is extremely fatigued.  He admits that he feels worse today than 2 months ago.  I personally reviewed and went over laboratory results with the patient.  His Hgb is 8.9 but he is demonstrating symptomatic anemia symptoms in the setting of MDS.  We will therefore transfuse 2 units PRBCs irradiated tomorrow.  I personally reviewed and went over laboratory results with the patient.   We will also decrease his dose of Revlimid.  We will reduce to 2.5 mg daily 21 days on and 7 days off.  He will hold his next three days of Revlimid.  He will start on 04/16/2012 with his next cycle of 2.5 mg Revlimid PO daily 21 days on and 7 days off.  He is agreeable to this plan.     Past Medical History  Diagnosis Date  . ASCVD (arteriosclerotic cardiovascular disease)     Sizable non-Q. myocardial infarction in 09/2009; severe three-vessel disease with an ejection fraction of 35%; three-vessel DES complicated by thrombocytopenia and mild  renal insufficiency  . Hyperlipidemia     09/2010: TC-151, TG-411, H.-32, L.-not calculated   . Anemia     H/H -12.3/37.2; MCV-103; platelets-114 (08/2010)   . 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 Hypertension   . Orthostatic hypotension     Lightheaded; no syncope  . Thrombocytopenia, acquired     09/2010-114,000  . Hepatic steatosis 2013  . Thrombocytopenia     referred to hem-onc 5/13    has LEFT BUNDLE BRANCH BLOCK; ATHEROSCLEROTIC CARDIOVASCULAR DISEASE; Thrombocytopenia, acquired; Hyperlipidemia; Hypertension; Orthostatic hypotension; Anemia, macrocytic; Insomnia; GERD (gastroesophageal reflux disease); and MDS (myelodysplastic syndrome) on his problem list.     is allergic to aspirin.  Mr. Loh had no medications administered during this visit.  Past Surgical History  Procedure Date  . Inguinal hernia repair     Right  . Appendectomy   . Coronary angioplasty with stent placement   . Bone marrow biopsy 02/19/12  . Bone marrow aspiration 02/19/12    Denies any headaches, dizziness, double vision, fevers, chills, night sweats, nausea, vomiting, diarrhea, constipation, chest pain, heart palpitations, shortness of breath, blood in stool, black tarry stool, urinary pain, urinary burning, urinary frequency, hematuria.   PHYSICAL EXAMINATION  ECOG PERFORMANCE STATUS: 2 - Symptomatic, <50% confined to bed  Filed Vitals:   04/07/12 0921  BP: 107/52  Pulse: 68  Temp: 97.4 F (36.3 C)  Resp: 16    GENERAL:alert, no distress, well nourished, well developed, comfortable, cooperative, ill looking and smiling, in a wheelchair which is abnormal for him. SKIN: skin color, texture, turgor are normal, no rashes or significant lesions HEAD: Normocephalic, No masses, lesions, tenderness or abnormalities EYES: normal, Conjunctiva are pink and non-injected EARS: External ears normal OROPHARYNX:lips, buccal mucosa, and tongue normal and  mucous membranes are moist  NECK: supple, no adenopathy, thyroid normal size, non-tender, without nodularity, no stridor, non-tender, trachea midline LYMPH:  no palpable lymphadenopathy BREAST:not examined LUNGS: clear to auscultation and percussion HEART: regular rate & rhythm, no murmurs, no gallops, S1 normal and S2 normal ABDOMEN:abdomen soft, non-tender and normal bowel sounds BACK: Back symmetric, no curvature., No CVA tenderness EXTREMITIES:less then 2 second capillary refill, no joint deformities, effusion, or inflammation, no edema, no skin discoloration, no clubbing, no cyanosis  NEURO: alert & oriented x 3 with fluent speech, no focal motor/sensory deficits   LABORATORY DATA: CBC    Component Value Date/Time   WBC 4.8 04/07/2012 0900   RBC 2.38* 04/07/2012 0900   HGB 8.9* 04/07/2012 0900   HCT 25.9* 04/07/2012 0900   PLT 56* 04/07/2012 0900   MCV 108.8* 04/07/2012 0900   MCH 37.4* 04/07/2012 0900   MCHC 34.4 04/07/2012 0900   RDW 14.6 04/07/2012 0900   LYMPHSABS 2.1 04/07/2012 0900   MONOABS 0.3 04/07/2012 0900   EOSABS 0.5 04/07/2012 0900   BASOSABS 0.1 04/07/2012 0900      Chemistry      Component Value Date/Time   NA 139 04/07/2012 0900   K 3.9 04/07/2012 0900   CL 104 04/07/2012 0900   CO2 24 04/07/2012 0900   BUN 19 04/07/2012 0900   CREATININE 1.30 04/07/2012 0900   CREATININE 1.35 03/07/2012 1210      Component Value Date/Time   CALCIUM 9.6 04/07/2012 0900   ALKPHOS 59 04/07/2012 0900   AST 13 04/07/2012 0900   ALT 10 04/07/2012 0900   BILITOT 0.8 04/07/2012 0900        PATHOLOGY: 02/19/2012  Diagnosis Bone Marrow, Aspirate,Biopsy, and Clot, left iliac crest - HYPERCELLULAR BONE MARROW FOR AGE WITH DYSPOIETIC CHANGES. - SEE COMMENT. PERIPHERAL BLOOD: - MACROCYTIC ANEMIA - THROMBOCYTOPENIA. Diagnosis Note The bone marrow is generally hypercellular for age with variable dyspoietic changes of myeloid cell lines. Megakaryocytes in particular show numerous  hypolobated/unilobated forms. No morphologic increase in blastic cells is identified. The overall morphologic features are worrisome for a clonal myelodysplastic state, particularly refractory cytopenia with multilineage dysplasia. Correlation with cytogenetic studies is strongly recommended. (BNS:kh 02-20-12) Guerry Bruin MD Pathologist, Electronic Signature (Case signed 02/20/2012)   ASSESSMENT:  1. MDS with 5q- syndrome.  On Revlimid 2. Fatigue 3. Symptomatic anemia.   PLAN:  1. I personally reviewed and went over laboratory results with the patient. 2. Transfuse 2 units PRBCs tomorrow, irradiated. 3. Decrease Revlimid to 2.5 mg (compared to 5 mg) daily for 21 days with 7 day respite. 4. Will hold Revlimid today, tomorrow, and next day.  Will restart at the aforementioned dose on 04/16/2012. 5. Lab work in 2 weeks: CBC diff 6. Return in 4 weeks for follow-up.  All questions were answered. The patient knows to call the clinic with any problems, questions or concerns. We can certainly see the patient much sooner if necessary.  The patient and plan discussed with Glenford Peers, MD and he is in agreement with the aforementioned.  Lakeria Starkman

## 2012-04-07 NOTE — Progress Notes (Signed)
Labs drawn today for cbc/diff.cmp 

## 2012-04-07 NOTE — Patient Instructions (Signed)
[facility]  Discharge Instructions Lee Ramirez  DOB Jan 31, 1922 CSN [id number]  MRN [id number] Dr. Glenford Peers    RECOMMENDATIONS MADE BY THE CONSULTANT AND ANY TEST RESULTS WILL BE SENT TO YOUR REFERRING DOCTOR.   EXAM FINDINGS BY MD TODAY AND SIGNS AND SYMPTOMS TO REPORT TO CLINIC OR PRIMARY MD:  You will be receiving 2 units blood tomorrow. The blood should make you feel better and make you have a little more energy.  Stop the Revlimid for 10 days total. Resume at Revlimid 2.5mg  as already scheduled on your med calendar.   I acknowledge that I have been informed and understand all the instructions given to me and received a copy. I do not have any more questions at this time, but understand that I may call the Specialty [facility] at [facility] at [phone] during business hours should I have any further questions or need assistance in obtaining follow-up care.    __________________________________________  _____________  __________ Signature of Patient or Authorized Representative            Date                   Time    __________________________________________ Nurse's Signature

## 2012-04-08 ENCOUNTER — Encounter (HOSPITAL_COMMUNITY): Payer: Medicare Other

## 2012-04-08 ENCOUNTER — Encounter (HOSPITAL_BASED_OUTPATIENT_CLINIC_OR_DEPARTMENT_OTHER): Payer: Medicare Other

## 2012-04-08 VITALS — BP 144/67 | HR 57 | Temp 97.0°F | Resp 16

## 2012-04-08 DIAGNOSIS — D469 Myelodysplastic syndrome, unspecified: Secondary | ICD-10-CM

## 2012-04-08 DIAGNOSIS — D46C Myelodysplastic syndrome with isolated del(5q) chromosomal abnormality: Secondary | ICD-10-CM

## 2012-04-08 MED ORDER — FUROSEMIDE 10 MG/ML IJ SOLN: 20.0000 mg | Freq: Once | INTRAMUSCULAR | Status: DC

## 2012-04-08 MED ORDER — SODIUM CHLORIDE 0.9 % IJ SOLN
10.0000 mL | INTRAMUSCULAR | Status: AC | PRN
  Administered 2012-04-08: 10 mL
  Filled 2012-04-08: qty 10

## 2012-04-08 MED ORDER — DIPHENHYDRAMINE HCL 25 MG PO CAPS
25.0000 mg | ORAL_CAPSULE | Freq: Once | ORAL | Status: AC
  Administered 2012-04-08: 25 mg via ORAL

## 2012-04-08 MED ORDER — SODIUM CHLORIDE 0.9 % IJ SOLN
INTRAMUSCULAR | Status: AC
  Filled 2012-04-08: qty 10

## 2012-04-08 MED ORDER — ACETAMINOPHEN 325 MG PO TABS
650.0000 mg | ORAL_TABLET | Freq: Once | ORAL | Status: AC
  Administered 2012-04-08: 650 mg via ORAL

## 2012-04-08 MED ORDER — DIPHENHYDRAMINE HCL 25 MG PO CAPS
ORAL_CAPSULE | ORAL | Status: AC
  Filled 2012-04-08: qty 1

## 2012-04-08 MED ORDER — SODIUM CHLORIDE 0.9 % IV SOLN
250.0000 mL | Freq: Once | INTRAVENOUS | Status: AC
  Administered 2012-04-08: 250 mL via INTRAVENOUS

## 2012-04-08 MED ORDER — FUROSEMIDE 10 MG/ML IJ SOLN
20.0000 mg | Freq: Once | INTRAMUSCULAR | Status: AC
  Administered 2012-04-08: 20 mg via INTRAVENOUS

## 2012-04-08 MED ORDER — ACETAMINOPHEN 325 MG PO TABS
ORAL_TABLET | ORAL | Status: AC
  Filled 2012-04-08: qty 2

## 2012-04-08 MED ORDER — FUROSEMIDE 10 MG/ML IJ SOLN
INTRAMUSCULAR | Status: AC
  Filled 2012-04-08: qty 2

## 2012-04-08 NOTE — Progress Notes (Signed)
Tolerated transfusion without s/s adverse reaction. 

## 2012-04-09 LAB — TYPE AND SCREEN
ABO/RH(D): O POS
Antibody Screen: NEGATIVE
Unit division: 0

## 2012-04-18 ENCOUNTER — Telehealth (HOSPITAL_COMMUNITY): Payer: Self-pay | Admitting: *Deleted

## 2012-04-18 NOTE — Telephone Encounter (Signed)
[name], daughter, called and left message stating that patient was having diarrhea. Call made to patient to assess his status. He states that he only has had 2 loose stools and that his bowels have slowed down. He reports being weak. But thinks he is getting stronger now. I instructed him to purchase some Imodium and after the next loose stool to take 1 or 2 tablets and then to follow the directions on the box. I also instructed him to drink 8 8oz glasses of fluid whether it be water juice milk. He verbalized understanding of all instructions and stated that he could tell [name] about the Imodium.

## 2012-04-21 ENCOUNTER — Telehealth (HOSPITAL_COMMUNITY): Payer: Self-pay | Admitting: *Deleted

## 2012-04-21 NOTE — Telephone Encounter (Signed)
Patient not doing good today. He is progressively getting weaker. Today has not been a good day per patient (as far as weakness). Revlimid was cut in half the other day. Patient has been on this dose for 4 or 5 days. Patient due for labs on Friday  Sept 6- are you ok with [place] getting labs on Tuesday Sept 3 instead?

## 2012-04-22 ENCOUNTER — Encounter (HOSPITAL_COMMUNITY): Payer: Medicare Other | Attending: Oncology

## 2012-04-22 VITALS — BP 131/76 | HR 74 | Resp 20

## 2012-04-22 DIAGNOSIS — D469 Myelodysplastic syndrome, unspecified: Secondary | ICD-10-CM

## 2012-04-22 DIAGNOSIS — R300 Dysuria: Secondary | ICD-10-CM

## 2012-04-22 DIAGNOSIS — D46C Myelodysplastic syndrome with isolated del(5q) chromosomal abnormality: Secondary | ICD-10-CM

## 2012-04-22 LAB — URINALYSIS, ROUTINE W REFLEX MICROSCOPIC
Bilirubin Urine: NEGATIVE
Glucose, UA: NEGATIVE mg/dL
Leukocytes, UA: NEGATIVE
Nitrite: NEGATIVE
Specific Gravity, Urine: 1.025 (ref 1.005–1.030)
pH: 5.5 (ref 5.0–8.0)

## 2012-04-22 LAB — DIFFERENTIAL
Basophils Absolute: 0.1 10*3/uL (ref 0.0–0.1)
Basophils Relative: 2 % — ABNORMAL HIGH (ref 0–1)
Eosinophils Absolute: 1.6 10*3/uL — ABNORMAL HIGH (ref 0.0–0.7)
Eosinophils Relative: 17 % — ABNORMAL HIGH (ref 0–5)
Lymphs Abs: 3.3 10*3/uL (ref 0.7–4.0)
Neutrophils Relative %: 40 % — ABNORMAL LOW (ref 43–77)

## 2012-04-22 LAB — CBC
MCH: 35.5 pg — ABNORMAL HIGH (ref 26.0–34.0)
MCHC: 34.7 g/dL (ref 30.0–36.0)
MCV: 102.3 fL — ABNORMAL HIGH (ref 78.0–100.0)
Platelets: 96 10*3/uL — ABNORMAL LOW (ref 150–400)
RBC: 3.07 MIL/uL — ABNORMAL LOW (ref 4.22–5.81)
RDW: 14.8 % (ref 11.5–15.5)

## 2012-04-22 LAB — COMPREHENSIVE METABOLIC PANEL
ALT: 11 U/L (ref 0–53)
Albumin: 3.5 g/dL (ref 3.5–5.2)
Alkaline Phosphatase: 76 U/L (ref 39–117)
Calcium: 10.2 mg/dL (ref 8.4–10.5)
GFR calc Af Amer: 53 mL/min — ABNORMAL LOW (ref >90)
Glucose, Bld: 108 mg/dL — ABNORMAL HIGH (ref 70–99)
Potassium: 4.3 mEq/L (ref 3.5–5.1)
Sodium: 137 mEq/L (ref 135–145)
Total Protein: 7.6 g/dL (ref 6.0–8.3)

## 2012-04-22 NOTE — Progress Notes (Signed)
Lee Ramirez presented for labwork. Labs per MD order drawn via Peripheral Line 24 gauge needle inserted in lt ac  Good blood return present. Procedure without incident.  Needle removed intact. Patient tolerated procedure well.

## 2012-04-22 NOTE — Progress Notes (Signed)
Patient notified that his labs were ok. He states that he may have felt a little better while off of Revlimid (when we gave him a break). He states that he doesn't feel like he needs to come in and see the doctor at present. He promises me that he will call if he continues to get weaker.

## 2012-04-23 ENCOUNTER — Other Ambulatory Visit (HOSPITAL_COMMUNITY): Payer: Medicare Other

## 2012-04-23 ENCOUNTER — Ambulatory Visit (HOSPITAL_COMMUNITY): Payer: Medicare Other | Admitting: Oncology

## 2012-04-25 ENCOUNTER — Ambulatory Visit: Payer: Medicare Other | Admitting: Cardiology

## 2012-04-25 ENCOUNTER — Telehealth (HOSPITAL_COMMUNITY): Payer: Self-pay | Admitting: *Deleted

## 2012-04-25 ENCOUNTER — Other Ambulatory Visit (HOSPITAL_COMMUNITY): Payer: Medicare Other

## 2012-04-25 NOTE — Telephone Encounter (Signed)
Lee Ramirez, patient's daughter, called in this am to let us know that patient had fallen this am. She stated that he was very weak and very tired. I discussed this with Edmonia Caprio and he stated that patient could stop the Revlimid for now and that we would see him as scheduled on 9/17 and before then if necessary. I instructed patient about these instructions and he verbalized understanding. Last time Revlimid was taken was 04/24/12 pm.

## 2012-05-06 ENCOUNTER — Encounter (HOSPITAL_BASED_OUTPATIENT_CLINIC_OR_DEPARTMENT_OTHER): Payer: Medicare Other | Admitting: Oncology

## 2012-05-06 ENCOUNTER — Other Ambulatory Visit (HOSPITAL_COMMUNITY): Payer: Medicare Other

## 2012-05-06 VITALS — BP 112/65 | HR 69 | Temp 97.8°F | Resp 20 | Ht 74.0 in | Wt 174.6 lb

## 2012-05-06 DIAGNOSIS — D469 Myelodysplastic syndrome, unspecified: Secondary | ICD-10-CM

## 2012-05-06 MED ORDER — LENALIDOMIDE 2.5 MG PO CAPS: ORAL_CAPSULE | ORAL | Status: DC

## 2012-05-06 NOTE — Progress Notes (Signed)
This encounter was created in error - please disregard.

## 2012-05-06 NOTE — Patient Instructions (Addendum)
[facility]  Discharge Instructions  RECOMMENDATIONS MADE BY THE CONSULTANT AND ANY TEST RESULTS WILL BE SENT TO YOUR REFERRING DOCTOR.   EXAM FINDINGS BY MD TODAY AND SIGNS AND SYMPTOMS TO REPORT TO CLINIC OR PRIMARY MD: Exam and discussion by PA.  MEDICATIONS PRESCRIBED: Revlimid 2.5 mg daily for 21 days beginning 05/12/12   INSTRUCTIONS GIVEN AND DISCUSSED: Other :  Report fevers, infections, increased fatigue or increased shortness of breath.  SPECIAL INSTRUCTIONS/FOLLOW-UP: Lab work Needed as scheduled and Return to Clinic to see MD in October.   I acknowledge that I have been informed and understand all the instructions given to me and received a copy. I do not have any more questions at this time, but understand that I may call the Specialty Clinic at [facility] at [phone] during business hours should I have any further questions or need assistance in obtaining follow-up care.    __________________________________________  _____________  __________ Signature of Patient or Authorized Representative            Date                   Time    __________________________________________ Nurse's Signature

## 2012-05-06 NOTE — Progress Notes (Signed)
[name], MD [address] [place] [address]  1. MDS (myelodysplastic syndrome)     CURRENT THERAPY: On Revlimid 2.5 mg 21 days with 7 day respite  INTERVAL HISTORY: Lee Ramirez 78 y.o. 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    Unfortunately, Lee Ramirez felt poor while taking the medication and actually fell on 04/25/2012 while opening a door.  He reports that he was opening a door towards him and leaned back, lost his balance, and fell backwards.  Fortunately, he denies any injuries.  He denies any injury to his head.  He reports that he had some right lateral hip pain, but that has since resolved.    Lee Ramirez reports that today he feels great since holding his Revlimid.  He reports that he began holding it on 05/02/2012.  He says he stopped about 4 days ago.  So I will give him an additional few days respite.  He will start Revlimid at 2.5 mg daily x 21 days with 7 days respite.  He will begin this on 05/12/2012.    He continues to complain of fatigue which is chronic for him even before the medication was initiated.  I spent some time with the patient discussing his diagnosis with a favorable 5q- syndrome.  It may take up to 6 months for him to feel better while on therapy.  He is agreeable to push forward with therapy as scheduled.  He denies that his fatigue and weakness progressively worsens while on the Revlimid.  He reports after taking the medication for 4 days, he feels much more fatigued and it remains stable without worsening or improvement while on the medication.   Complete ROS questioning is negative.  He continues to care for his wife which I suspect is taxing on him.  Fortunately, he has a care giver who provides him 3 hours of respite daily.   Past Medical History  Diagnosis Date  . ASCVD (arteriosclerotic cardiovascular disease)     Sizable non-Q. myocardial infarction  in 09/2009; severe three-vessel disease with an ejection fraction of 35%; three-vessel DES complicated by thrombocytopenia and mild renal insufficiency  . Hyperlipidemia     09/2010: TC-151, TG-411, H.-32, L.-not calculated   . Anemia     H/H -12.3/37.2; MCV-103; platelets-114 (08/2010)   . 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 Hypertension   . Orthostatic hypotension     Lightheaded; no syncope  . Thrombocytopenia, acquired     09/2010-114,000  . Hepatic steatosis 2013  . Thrombocytopenia     referred to hem-onc 5/13    has LEFT BUNDLE BRANCH BLOCK; ATHEROSCLEROTIC CARDIOVASCULAR DISEASE; Thrombocytopenia, acquired; Hyperlipidemia; Hypertension; Orthostatic hypotension; Anemia, macrocytic; Insomnia; GERD (gastroesophageal reflux disease); MDS (myelodysplastic syndrome); and Burning with urination on his problem list.     is allergic to aspirin.  Lee Ramirez does not currently have medications on file.  Past Surgical History  Procedure Date  . Inguinal hernia repair     Right  . Appendectomy   . Coronary angioplasty with stent placement   . Bone marrow biopsy 02/19/12  . Bone marrow aspiration 02/19/12    Denies any headaches, dizziness, double vision, fevers, chills, night sweats, nausea, vomiting, diarrhea, constipation, chest pain, heart palpitations, shortness of breath, blood in stool, black tarry stool, urinary pain, urinary burning, urinary frequency, hematuria.   PHYSICAL EXAMINATION  ECOG PERFORMANCE STATUS: 2 - Symptomatic, <50% confined to bed  Filed Vitals:   05/06/12 1111  BP: 112/65  Pulse: 69  Temp: 97.8 F (36.6 C)  Resp: 20    GENERAL:alert, no distress, well nourished, well developed, comfortable, cooperative and smiling SKIN: skin color, texture, turgor are normal, no rashes or significant lesions HEAD: Normocephalic, No masses, lesions, tenderness or abnormalities EYES: normal, Conjunctiva are pink and  non-injected EARS: External ears normal OROPHARYNX:lips, buccal mucosa, and tongue normal and mucous membranes are moist  NECK: supple, trachea midline LYMPH:  not examined BREAST:not examined LUNGS: clear to auscultation  HEART: regular rate & rhythm, no murmurs, no gallops, S1 normal and S2 normal ABDOMEN:abdomen soft, non-tender and normal bowel sounds BACK: Back symmetric, no curvature. EXTREMITIES:less then 2 second capillary refill, no joint deformities, effusion, or inflammation, no edema, no skin discoloration, no clubbing, no cyanosis  NEURO: alert & oriented x 3 with fluent speech, no focal motor/sensory deficits, gait normal   LABORATORY DATA: CBC    Component Value Date/Time   WBC 8.9 04/22/2012 1444   RBC 3.07* 04/22/2012 1444   HGB 10.9* 04/22/2012 1444   HCT 31.4* 04/22/2012 1444   PLT 96* 04/22/2012 1444   MCV 102.3* 04/22/2012 1444   MCH 35.5* 04/22/2012 1444   MCHC 34.7 04/22/2012 1444   RDW 14.8 04/22/2012 1444   LYMPHSABS 3.3 04/22/2012 1444   MONOABS 0.4 04/22/2012 1444   EOSABS 1.6* 04/22/2012 1444   BASOSABS 0.1 04/22/2012 1444     PATHOLOGY: 02/19/2012  Diagnosis  Bone Marrow, Aspirate,Biopsy, and Clot, left iliac crest  - HYPERCELLULAR BONE MARROW FOR AGE WITH DYSPOIETIC CHANGES.  - SEE COMMENT.  PERIPHERAL BLOOD:  - MACROCYTIC ANEMIA  - THROMBOCYTOPENIA.  Diagnosis Note  The bone marrow is generally hypercellular for age with variable dyspoietic changes of myeloid cell lines.  Megakaryocytes in particular show numerous hypolobated/unilobated forms. No morphologic increase in  blastic cells is identified. The overall morphologic features are worrisome for a clonal myelodysplastic state,  particularly refractory cytopenia with multilineage dysplasia. Correlation with cytogenetic studies is strongly  recommended. (BNS:kh 02-20-12)  Guerry Bruin MD  Pathologist, Electronic Signature  (Case signed 02/20/2012)    ASSESSMENT:  1. MDS with 5q- syndrome. On Revlimid  2.  Fatigue   PLAN:  1. Continue with holding the Revlimid. 2. Start next cycle on 05/12/2012 (Monday). 3. Take 2.5 mg daily x 21 days followed by a 7 day respite. 4. Lab work as scheduled and ordered.  5. Call with any issues.  6. Continue utilizing cane for ambulation balance.  7. Return in 4 weeks for follow-up.    All questions were answered. The patient knows to call the clinic with any problems, questions or concerns. We can certainly see the patient much sooner if necessary.  The patient and plan discussed with Glenford Peers, MD and he is in agreement with the aforementioned.  Rubylee Zamarripa

## 2012-05-07 ENCOUNTER — Other Ambulatory Visit (HOSPITAL_COMMUNITY): Payer: Medicare Other

## 2012-05-15 ENCOUNTER — Encounter: Payer: Self-pay | Admitting: Cardiology

## 2012-05-15 ENCOUNTER — Ambulatory Visit (INDEPENDENT_AMBULATORY_CARE_PROVIDER_SITE_OTHER): Payer: Medicare Other | Admitting: Cardiology

## 2012-05-15 VITALS — BP 122/70 | HR 69 | Ht 74.0 in | Wt 174.1 lb

## 2012-05-15 DIAGNOSIS — I2510 Atherosclerotic heart disease of native coronary artery without angina pectoris: Secondary | ICD-10-CM

## 2012-05-15 DIAGNOSIS — D539 Nutritional anemia, unspecified: Secondary | ICD-10-CM

## 2012-05-15 DIAGNOSIS — D696 Thrombocytopenia, unspecified: Secondary | ICD-10-CM

## 2012-05-15 DIAGNOSIS — I10 Essential (primary) hypertension: Secondary | ICD-10-CM

## 2012-05-15 DIAGNOSIS — D6959 Other secondary thrombocytopenia: Secondary | ICD-10-CM

## 2012-05-15 NOTE — Progress Notes (Signed)
Patient ID: Lee Ramirez, male   DOB: [birth year], 78 y.o.   MRN: [id number]  HPI: Scheduled return visit for this delightful older gentleman with myeloproliferative syndrome and ischemic cardiomyopathy.  He developed weakness some weeks ago after starting Revlimid, but with dosage adjustments, he is now tolerating that medication.  He has had no recent orthostatic symptoms and reports feeling the best that he has for some time.  He was transfused 2 units of packed red blood cells 2 weeks ago.  Prior to Admission medications   Medication Sig Start Date End Date Taking? Authorizing Provider  acetaminophen (TYLENOL) 500 MG tablet Take 1,000 mg by mouth every 6 (six) hours as needed. Pain   Yes Historical Provider, MD  albuterol (PROAIR HFA) 108 (90 BASE) MCG/ACT inhaler Inhale 2 puffs into the lungs every 6 (six) hours as needed. For shortness of breath   Yes Historical Provider, MD  allopurinol (ZYLOPRIM) 300 MG tablet Take 150 mg by mouth daily.    Yes Historical Provider, MD  carvedilol (COREG) 12.5 MG tablet Take 0.5 tablets (6.25 mg total) by mouth 2 (two) times daily with a meal. 03/11/12 03/11/13 Yes Kathlen Brunswick, MD  CRANBERRY PO Take 1 capsule by mouth daily.   Yes Historical Provider, MD  furosemide (LASIX) 20 MG tablet Take 0.5 tablets (10 mg total) by mouth daily. 03/11/12 03/11/13 Yes Kathlen Brunswick, MD  lenalidomide (REVLIMID) 2.5 MG capsule Take 1 tablet, PO 21 days on and 7 days off.  Start on 05/12/2012. 05/06/12  Yes Ellouise Newer, PA  lisinopril (PRINIVIL,ZESTRIL) 2.5 MG tablet Take 1 tablet (2.5 mg total) by mouth daily. 03/11/12 03/11/13 Yes Kathlen Brunswick, MD  meclizine (ANTIVERT) 12.5 MG tablet Take 12.5 mg by mouth 3 (three) times daily as needed. Dizziness   Yes Historical Provider, MD  Multiple Vitamins-Iron (DAILY-VITAMIN/IRON PO) Take 1 tablet by mouth daily.   Yes Historical Provider, MD  nitroGLYCERIN (NITROSTAT) 0.4 MG SL tablet Place 1 tablet (0.4 mg total) under  the tongue every 5 (five) minutes as needed. 12/29/10  Yes Tonny Bollman, MD  omeprazole (PRILOSEC) 20 MG capsule Take 20 mg by mouth daily.   Yes Historical Provider, MD  ondansetron (ZOFRAN) 4 MG tablet Take 4 mg by mouth every 6 (six) hours.   Yes Historical Provider, MD  potassium chloride (MICRO-K) 10 MEQ CR capsule Take 1 capsule (10 mEq total) by mouth daily. 01/11/12 01/10/13 Yes Jodelle Gross, NP  PRAVACHOL 40 MG tablet TAKE (1) TABLET BY MOUTH AT BEDTIME FOR CHOLESTEROL. 11/16/11  Yes Kathlen Brunswick, MD  Travoprost, BAK Free, (TRAVATAN) 0.004 % SOLN ophthalmic solution Place 1 drop into both eyes at bedtime.    Yes Historical Provider, MD  zolpidem (AMBIEN) 10 MG tablet Take 5 mg by mouth at bedtime as needed. For sleep   Yes Historical Provider, MD   Allergies  Allergen Reactions  . Aspirin Anaphylaxis  Past medical history, social history, and family history reviewed and updated.  ROS: Peripheral edema has resolved.  He denies orthopnea, PND, palpitations or syncope.  All other systems reviewed and are negative.  PHYSICAL EXAM: BP 122/70  Pulse 69  Ht 6\' 2"  (1.88 m)  Wt 78.98 kg (174 lb 1.9 oz)  BMI 22.36 kg/m2  SpO2 97%  General-Well developed; no acute distress Body habitus-Thin Neck-No JVD; no carotid bruits Lungs-clear lung fields; resonant to percussion Cardiovascular-normal PMI; normal S1 and S2; modest systolic ejection murmur at the cardiac base Abdomen-normal bowel  sounds; soft and non-tender without masses or organomegaly Musculoskeletal-No deformities, no cyanosis or clubbing Neurologic-Normal cranial nerves; symmetric strength and tone Skin-Warm, Seborrheic keratosis above the right ear; ulcerated lesion over the nose-possible basal cell carcinoma Extremities-distal pulses intact; no edema  ASSESSMENT AND PLAN:  [place] Bing, MD 05/15/2012 2:28 PM

## 2012-05-15 NOTE — Assessment & Plan Note (Signed)
Symptoms improved following dose adjustment of Revlimid and transfusion.

## 2012-05-15 NOTE — Patient Instructions (Signed)
Your physician recommends that you schedule a follow-up appointment in: 6 months  If you continue to have dizziness, upon standing, have a salty beverage or 16 oz of soup.

## 2012-05-15 NOTE — Assessment & Plan Note (Addendum)
No symptoms at present to suggest myocardial ischemia.  Our principal goal at this patient's advanced age is to optimize cardiac function and avoid clinical congestive heart failure.  Dosage of cardiac medications not optimal, but cannot be increased due to orthostatic hypotension.

## 2012-05-15 NOTE — Assessment & Plan Note (Signed)
No recent significant elevation in blood pressure.  No specific antihypertensive therapy is required, as BP is adequately controlled with medications required for treatment of his cardiomyopathy.

## 2012-05-15 NOTE — Progress Notes (Deleted)
Name: Lee Ramirez    DOB: 1922-08-07  Age: 78 y.o.  MR#: [id number]       PCP:  [name], MD      Insurance: @PAYORNAME @   CC:   No chief complaint on file.   VS BP 122/70  Pulse 69  Ht 6\' 2"  (1.88 m)  Wt 174 lb 1.9 oz (78.98 kg)  BMI 22.36 kg/m2  SpO2 97%  Weights Current Weight  05/15/12 174 lb 1.9 oz (78.98 kg)  05/06/12 174 lb 9.6 oz (79.198 kg)  04/07/12 178 lb 14.4 oz (81.149 kg)    Blood Pressure  BP Readings from Last 3 Encounters:  05/15/12 122/70  05/06/12 112/65  04/22/12 131/76     Admit date:  (Not on file) Last encounter with RMR:  03/11/2012   Allergy Allergies  Allergen Reactions  . Aspirin Anaphylaxis    Current Outpatient Prescriptions  Medication Sig Dispense Refill  . acetaminophen (TYLENOL) 500 MG tablet Take 1,000 mg by mouth every 6 (six) hours as needed. Pain      . albuterol (PROAIR HFA) 108 (90 BASE) MCG/ACT inhaler Inhale 2 puffs into the lungs every 6 (six) hours as needed. For shortness of breath      . allopurinol (ZYLOPRIM) 300 MG tablet Take 150 mg by mouth daily.       . carvedilol (COREG) 12.5 MG tablet Take 0.5 tablets (6.25 mg total) by mouth 2 (two) times daily with a meal.  30 tablet  12  . CRANBERRY PO Take 1 capsule by mouth daily.      . furosemide (LASIX) 20 MG tablet Take 0.5 tablets (10 mg total) by mouth daily.  30 tablet  12  . lenalidomide (REVLIMID) 2.5 MG capsule Take 1 tablet, PO 21 days on and 7 days off.  Start on 05/12/2012.  21 capsule  5  . lisinopril (PRINIVIL,ZESTRIL) 2.5 MG tablet Take 1 tablet (2.5 mg total) by mouth daily.  30 tablet  12  . meclizine (ANTIVERT) 12.5 MG tablet Take 12.5 mg by mouth 3 (three) times daily as needed. Dizziness      . Multiple Vitamins-Iron (DAILY-VITAMIN/IRON PO) Take 1 tablet by mouth daily.      . nitroGLYCERIN (NITROSTAT) 0.4 MG SL tablet Place 1 tablet (0.4 mg total) under the tongue every 5 (five) minutes as needed.  25 tablet  10  . omeprazole (PRILOSEC) 20 MG  capsule Take 20 mg by mouth daily.      . ondansetron (ZOFRAN) 4 MG tablet Take 4 mg by mouth every 6 (six) hours.      . potassium chloride (MICRO-K) 10 MEQ CR capsule Take 1 capsule (10 mEq total) by mouth daily.  30 capsule  12  . PRAVACHOL 40 MG tablet TAKE (1) TABLET BY MOUTH AT BEDTIME FOR CHOLESTEROL.  30 each  6  . Travoprost, BAK Free, (TRAVATAN) 0.004 % SOLN ophthalmic solution Place 1 drop into both eyes at bedtime.       Marland Kitchen zolpidem (AMBIEN) 10 MG tablet Take 5 mg by mouth at bedtime as needed. For sleep        Discontinued Meds:   There are no discontinued medications.  Patient Active Problem List  Diagnosis  . LEFT BUNDLE BRANCH BLOCK  . ATHEROSCLEROTIC CARDIOVASCULAR DISEASE  . Thrombocytopenia, acquired  . Hyperlipidemia  . Hypertension  . Orthostatic hypotension  . Anemia, macrocytic  . Insomnia  . GERD (gastroesophageal reflux disease)  . MDS (myelodysplastic syndrome) with 5q- syndrome  .  Burning with urination    LABS Infusion on 04/22/2012  Component Date Value  . WBC 04/22/2012 8.9   . RBC 04/22/2012 3.07*  . Hemoglobin 04/22/2012 10.9*  . HCT 04/22/2012 31.4*  . MCV 04/22/2012 102.3*  . [facility] 04/22/2012 35.5*  . MCHC 04/22/2012 34.7   . RDW 04/22/2012 14.8   . Platelets 04/22/2012 96*  . Neutrophils Relative 04/22/2012 40*  . Neutro Abs 04/22/2012 3.6   . Lymphocytes Relative 04/22/2012 37   . Lymphs Abs 04/22/2012 3.3   . Monocytes Relative 04/22/2012 4   . Monocytes Absolute 04/22/2012 0.4   . Eosinophils Relative 04/22/2012 17*  . Eosinophils Absolute 04/22/2012 1.6*  . Basophils Relative 04/22/2012 2*  . Basophils Absolute 04/22/2012 0.1   . Sodium 04/22/2012 137   . Potassium 04/22/2012 4.3   . Chloride 04/22/2012 101   . CO2 04/22/2012 26   . Glucose, Bld 04/22/2012 108*  . BUN 04/22/2012 19   . Creatinine, Ser 04/22/2012 1.34   . Calcium 04/22/2012 10.2   . Total Protein 04/22/2012 7.6   . Albumin 04/22/2012 3.5   . AST 04/22/2012  13   . ALT 04/22/2012 11   . Alkaline Phosphatase 04/22/2012 76   . Total Bilirubin 04/22/2012 1.1   . GFR calc non Af Amer 04/22/2012 46*  . GFR calc Af Amer 04/22/2012 53*  . Color, Urine 04/22/2012 YELLOW   . APPearance 04/22/2012 CLEAR   . Specific Gravity, Urine 04/22/2012 1.025   . pH 04/22/2012 5.5   . Glucose, UA 04/22/2012 NEGATIVE   . Hgb urine dipstick 04/22/2012 NEGATIVE   . Bilirubin Urine 04/22/2012 NEGATIVE   . Ketones, ur 04/22/2012 NEGATIVE   . Protein, ur 04/22/2012 NEGATIVE   . Urobilinogen, UA 04/22/2012 0.2   . Nitrite 04/22/2012 NEGATIVE   . Leukocytes, UA 04/22/2012 NEGATIVE   Office Visit on 04/07/2012  Component Date Value  . Order Confirmation 04/07/2012 ORDER PROCESSED BY BLOOD BANK   . ABO/RH(D) 04/07/2012 O POS   . Antibody Screen 04/07/2012 NEG   . Sample Expiration 04/07/2012 04/10/2012   . Unit Number 04/07/2012 [id number]   . Blood Component Type 04/07/2012 RCLI PHER 1   . Unit division 04/07/2012 00   . Status of Unit 04/07/2012 ISSUED,FINAL   . Transfusion Status 04/07/2012 OK TO TRANSFUSE   . Crossmatch Result 04/07/2012 Compatible   . Unit Number 04/07/2012 [id number]   . Blood Component Type 04/07/2012 RBC, LR IRR   . Unit division 04/07/2012 00   . Status of Unit 04/07/2012 ISSUED,FINAL   . Transfusion Status 04/07/2012 OK TO TRANSFUSE   . Crossmatch Result 04/07/2012 Compatible   . ABO/RH(D) 04/07/2012 O POS   Infusion on 04/07/2012  Component Date Value  . WBC 04/07/2012 4.8   . RBC 04/07/2012 2.38*  . Hemoglobin 04/07/2012 8.9*  . HCT 04/07/2012 25.9*  . MCV 04/07/2012 108.8*  . MCH 04/07/2012 37.4*  . MCHC 04/07/2012 34.4   . RDW 04/07/2012 14.6   . Platelets 04/07/2012 56*  . Neutrophils Relative 04/07/2012 37*  . Neutro Abs 04/07/2012 1.8   . Lymphocytes Relative 04/07/2012 44   . Lymphs Abs 04/07/2012 2.1   . Monocytes Relative 04/07/2012 7   . Monocytes Absolute 04/07/2012 0.3   . Eosinophils Relative  04/07/2012 11*  . Eosinophils Absolute 04/07/2012 0.5   . Basophils Relative 04/07/2012 1   . Basophils Absolute 04/07/2012 0.1   . Sodium 04/07/2012 139   . Potassium 04/07/2012 3.9   .  Chloride 04/07/2012 104   . CO2 04/07/2012 24   . Glucose, Bld 04/07/2012 142*  . BUN 04/07/2012 19   . Creatinine, Ser 04/07/2012 1.30   . Calcium 04/07/2012 9.6   . Total Protein 04/07/2012 6.9   . Albumin 04/07/2012 3.4*  . AST 04/07/2012 13   . ALT 04/07/2012 10   . Alkaline Phosphatase 04/07/2012 59   . Total Bilirubin 04/07/2012 0.8   . GFR calc non Af Amer 04/07/2012 48*  . GFR calc Af Amer 04/07/2012 55*  Orders Only on 03/04/2012  Component Date Value  . Sodium 03/07/2012 142   . Potassium 03/07/2012 4.4   . Chloride 03/07/2012 107   . CO2 03/07/2012 27   . Glucose, Bld 03/07/2012 98   . BUN 03/07/2012 19   . Creat 03/07/2012 1.35   . Calcium 03/07/2012 9.5   Office Visit on 02/19/2012  Component Date Value  . WBC 02/19/2012 5.7   . RBC 02/19/2012 2.92*  . Hemoglobin 02/19/2012 10.4*  . HCT 02/19/2012 30.9*  . MCV 02/19/2012 105.8*  . MCH 02/19/2012 35.6*  . MCHC 02/19/2012 33.7   . RDW 02/19/2012 14.6   . Platelets 02/19/2012 98*  . Neutrophils Relative 02/19/2012 45   . Neutro Abs 02/19/2012 2.6   . Lymphocytes Relative 02/19/2012 41   . Lymphs Abs 02/19/2012 2.3   . Monocytes Relative 02/19/2012 7   . Monocytes Absolute 02/19/2012 0.4   . Eosinophils Relative 02/19/2012 6*  . Eosinophils Absolute 02/19/2012 0.3   . Basophils Relative 02/19/2012 1   . Basophils Absolute 02/19/2012 0.1      Results for this Opt Visit:     Results for orders placed in visit on 04/22/12  CBC      Component Value Range   WBC 8.9  4.0 - 10.5 K/uL   RBC 3.07 (*) 4.22 - 5.81 MIL/uL   Hemoglobin 10.9 (*) 13.0 - 17.0 g/dL   HCT 09.8 (*) 11.9 - 14.7 %   MCV 102.3 (*) 78.0 - 100.0 fL   MCH 35.5 (*) 26.0 - 34.0 pg   MCHC 34.7  30.0 - 36.0 g/dL   RDW 82.9  56.2 - 13.0 %   Platelets 96  (*) 150 - 400 K/uL  DIFFERENTIAL      Component Value Range   Neutrophils Relative 40 (*) 43 - 77 %   Neutro Abs 3.6  1.7 - 7.7 K/uL   Lymphocytes Relative 37  12 - 46 %   Lymphs Abs 3.3  0.7 - 4.0 K/uL   Monocytes Relative 4  3 - 12 %   Monocytes Absolute 0.4  0.1 - 1.0 K/uL   Eosinophils Relative 17 (*) 0 - 5 %   Eosinophils Absolute 1.6 (*) 0.0 - 0.7 K/uL   Basophils Relative 2 (*) 0 - 1 %   Basophils Absolute 0.1  0.0 - 0.1 K/uL  COMPREHENSIVE METABOLIC PANEL      Component Value Range   Sodium 137  135 - 145 mEq/L   Potassium 4.3  3.5 - 5.1 mEq/L   Chloride 101  96 - 112 mEq/L   CO2 26  19 - 32 mEq/L   Glucose, Bld 108 (*) 70 - 99 mg/dL   BUN 19  6 - 23 mg/dL   Creatinine, Ser 8.65  0.50 - 1.35 mg/dL   Calcium 78.4  8.4 - 69.6 mg/dL   Total Protein 7.6  6.0 - 8.3 g/dL   Albumin 3.5  3.5 -  5.2 g/dL   AST 13  0 - 37 U/L   ALT 11  0 - 53 U/L   Alkaline Phosphatase 76  39 - 117 U/L   Total Bilirubin 1.1  0.3 - 1.2 mg/dL   GFR calc non Af Amer 46 (*) >90 mL/min   GFR calc Af Amer 53 (*) >90 mL/min  URINALYSIS, ROUTINE W REFLEX MICROSCOPIC      Component Value Range   Color, Urine YELLOW  YELLOW   APPearance CLEAR  CLEAR   Specific Gravity, Urine 1.025  1.005 - 1.030   pH 5.5  5.0 - 8.0   Glucose, UA NEGATIVE  NEGATIVE mg/dL   Hgb urine dipstick NEGATIVE  NEGATIVE   Bilirubin Urine NEGATIVE  NEGATIVE   Ketones, ur NEGATIVE  NEGATIVE mg/dL   Protein, ur NEGATIVE  NEGATIVE mg/dL   Urobilinogen, UA 0.2  0.0 - 1.0 mg/dL   Nitrite NEGATIVE  NEGATIVE   Leukocytes, UA NEGATIVE  NEGATIVE    EKG Orders placed during the hospital encounter of 11/24/11  . EKG     Prior Assessment and Plan Problem List as of 05/15/2012            Cardiology Problems   LEFT BUNDLE BRANCH BLOCK   ATHEROSCLEROTIC CARDIOVASCULAR DISEASE   Last Assessment & Plan Note   03/11/2012 Office Visit Signed 03/11/2012  4:40 PM by Kathlen Brunswick, MD    Patient appears stable with respect to coronary  disease and cardiomyopathy.  CHF is compensated; however, symptomatic orthostatic hypotension has resulted.  Diuretic, ACE inhibitor and beta blocker doses will be reduced by 50%.  The patient's daughter will monitor her weight and blood pressure at home reporting any abnormalities.  I will reassess him in a short interval in 7-10 days.    Hyperlipidemia   Last Assessment & Plan Note   11/15/2011 Office Visit Signed 11/16/2011  3:30 PM by Kathlen Brunswick, MD    Lipid profile was excellent one year ago except for elevated triglycerides.  We will continue to monitor effectiveness of lipid lowering therapy.    Hypertension   Last Assessment & Plan Note   01/18/2012 Office Visit Signed 01/18/2012  6:22 PM by Kathlen Brunswick, MD    Blood pressure control has been quite good and should improve further as carvedilol and lisinopril doses are titrated upwards.    Orthostatic hypotension   Last Assessment & Plan Note   03/20/2012 Office Visit Signed 03/20/2012  5:18 PM by Jodelle Gross, NP    Is symptomatically better with changes in medication doses, he remains orthostatic with blood pressure dropping from 127-96 systolically from lying to standing. He did have some dizziness. Heart rate was stable. We will not change any of his medications as he is essentially asymptomatic despite a blood pressure drop at this time. He is to followup with Dr. Dietrich Pates in approximately one month for continued evaluation. He is to avoid prolonged standing. He is to take his time getting from a sitting to standing position.      Other   Thrombocytopenia, acquired   Last Assessment & Plan Note   07/16/2011 Office Visit Signed 07/16/2011  7:29 PM by Kathlen Brunswick, MD    Mild thrombocytopenia is stable to improved.    Anemia, macrocytic   Last Assessment & Plan Note   03/11/2012 Office Visit Signed 03/11/2012  4:39 PM by Kathlen Brunswick, MD    Dr. Mariel Sleet contacted.  Bone marrow examination revealed  a treatable  cytologic abnormality.  Therapy with a new drug will be initiated at his next visit.    Insomnia   GERD (gastroesophageal reflux disease)   MDS (myelodysplastic syndrome) with 5q- syndrome   Burning with urination       Imaging: No results found.   FRS Calculation: Score not calculated

## 2012-05-21 ENCOUNTER — Encounter (HOSPITAL_COMMUNITY): Payer: Medicare Other | Attending: Oncology

## 2012-05-21 ENCOUNTER — Telehealth (HOSPITAL_COMMUNITY): Payer: Self-pay | Admitting: *Deleted

## 2012-05-21 DIAGNOSIS — D469 Myelodysplastic syndrome, unspecified: Secondary | ICD-10-CM | POA: Insufficient documentation

## 2012-05-21 DIAGNOSIS — D649 Anemia, unspecified: Secondary | ICD-10-CM

## 2012-05-21 LAB — DIFFERENTIAL
Basophils Absolute: 0 10*3/uL (ref 0.0–0.1)
Lymphocytes Relative: 32 % (ref 12–46)
Lymphs Abs: 2.5 10*3/uL (ref 0.7–4.0)
Neutro Abs: 3.4 10*3/uL (ref 1.7–7.7)

## 2012-05-21 LAB — CBC
HCT: 32.6 % — ABNORMAL LOW (ref 39.0–52.0)
Hemoglobin: 10.9 g/dL — ABNORMAL LOW (ref 13.0–17.0)
MCHC: 33.4 g/dL (ref 30.0–36.0)

## 2012-05-21 NOTE — Progress Notes (Signed)
Labs drawn today for cmp,cbc/diff 

## 2012-05-21 NOTE — Telephone Encounter (Signed)
Placed call to pt to let him know that lab tests  was unable to be done. Message left for him to call us to come in to have cmet redrawn.

## 2012-05-22 IMAGING — US US ABDOMEN COMPLETE
1 series · 13 of 25 positions shown · non-contrast
Comparison: No priors.

CLINICAL DATA: Generalized abdominal pain.  History of
thrombocytopenia.

COMPLETE ABDOMINAL ULTRASOUND

[Series 1: us abdomen complete · 0.25mm/px · 13 of 75 slices shown]
[im 1/75]
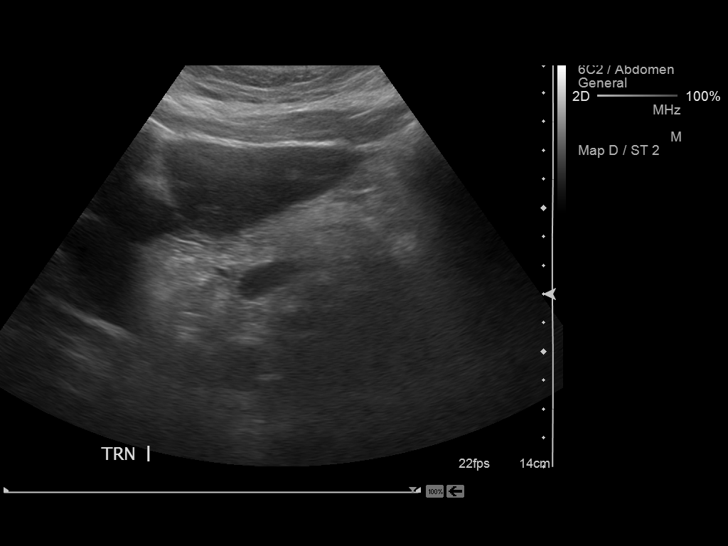
[im 7/75]
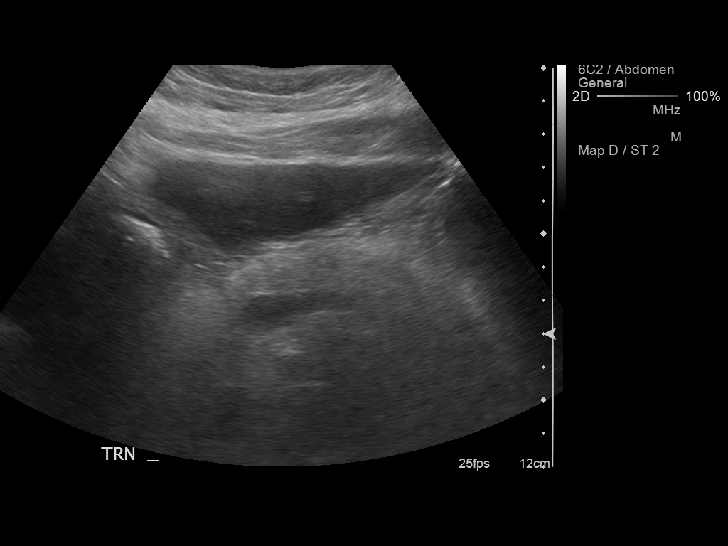
[im 13/75]
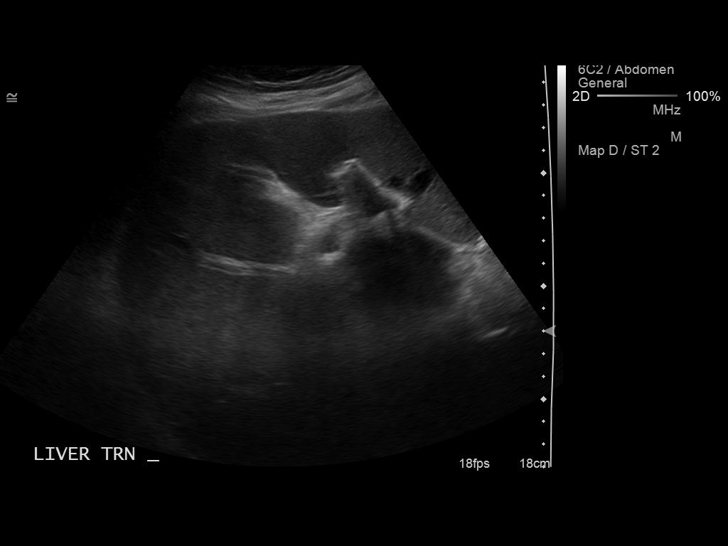
[im 19/75]
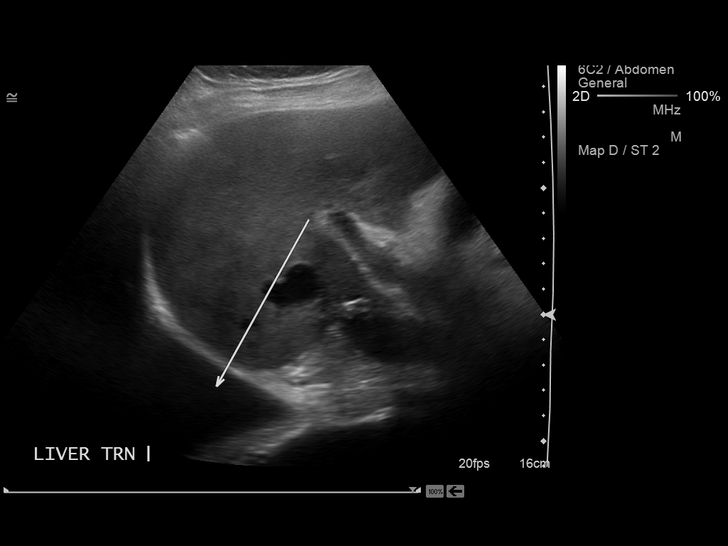
[im 25/75]
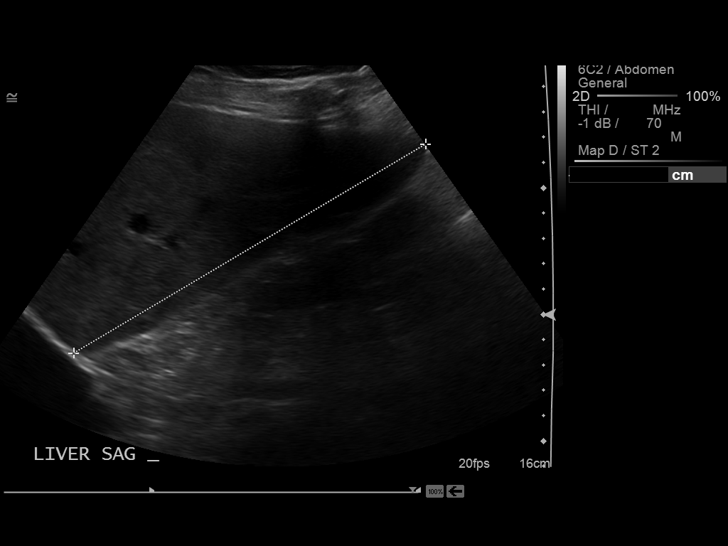
[im 31/75]
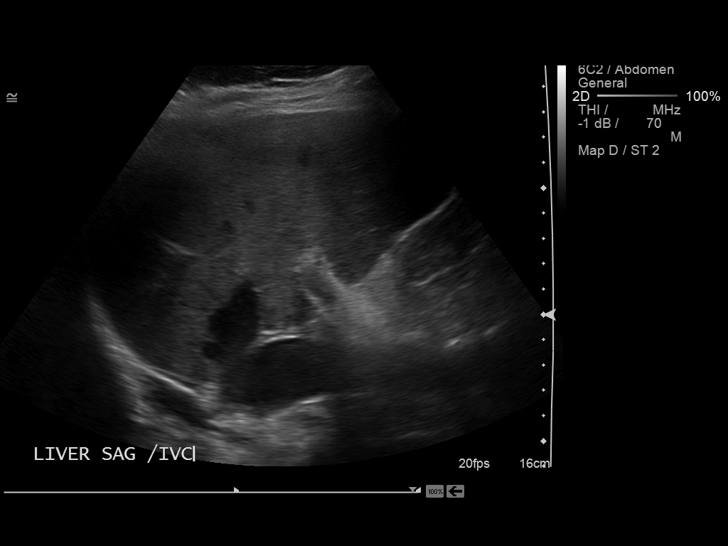
[im 38/75]
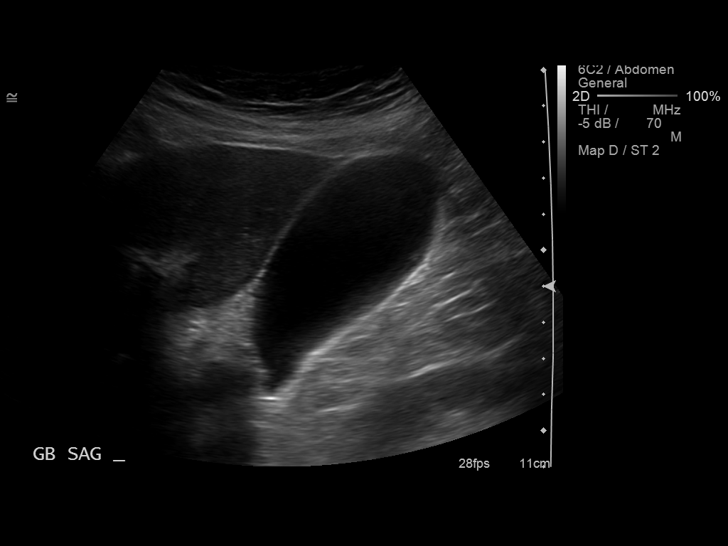
[im 44/75]
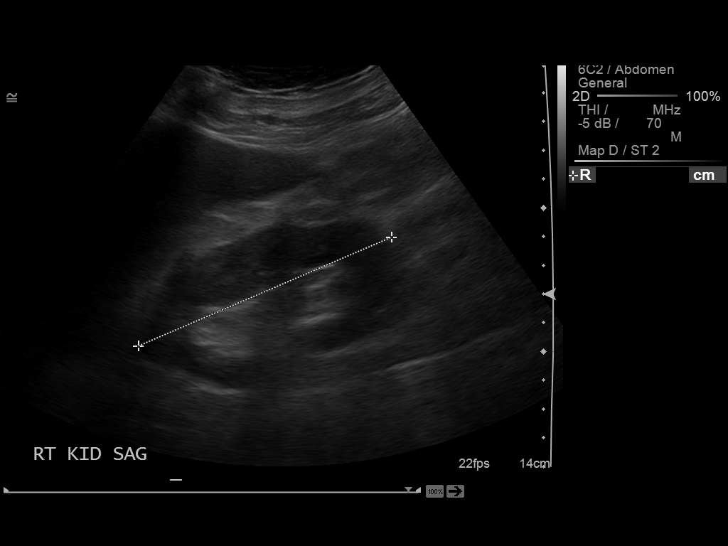
[im 50/75]
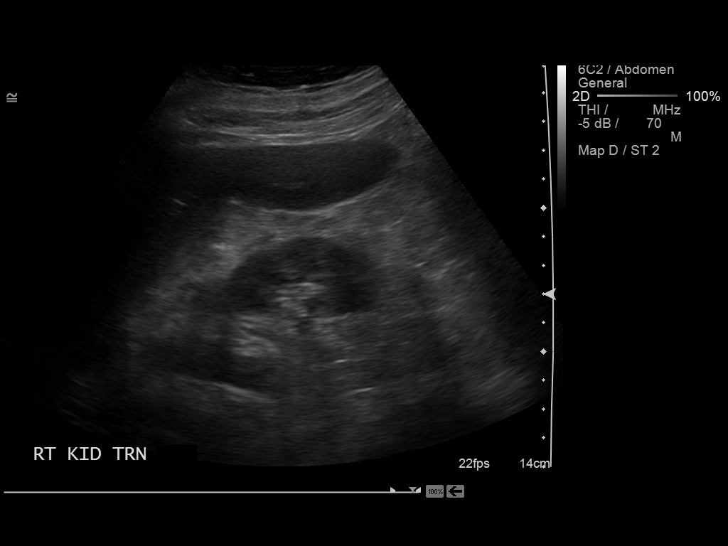
[im 56/75]
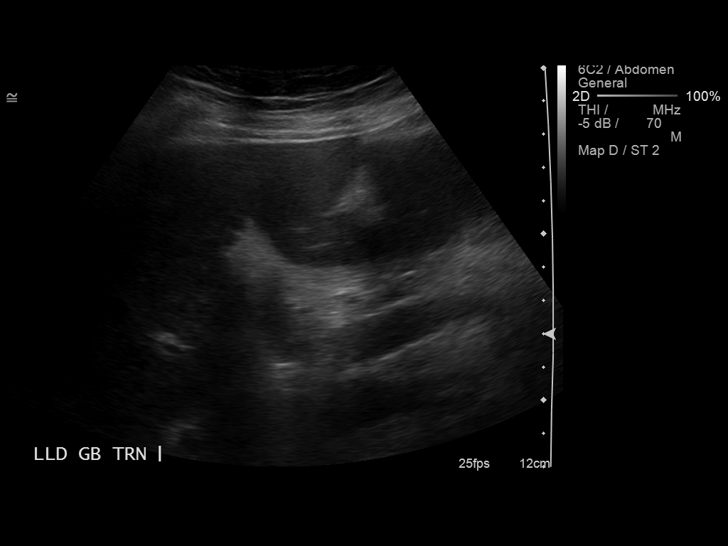
[im 62/75]
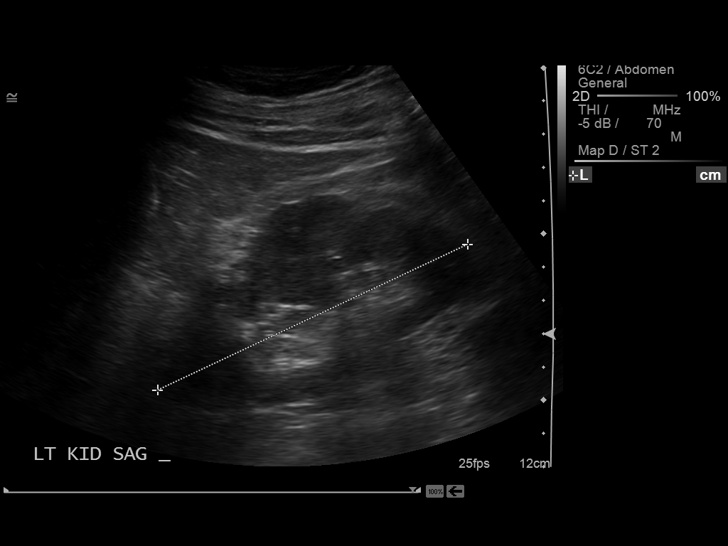
[im 68/75]
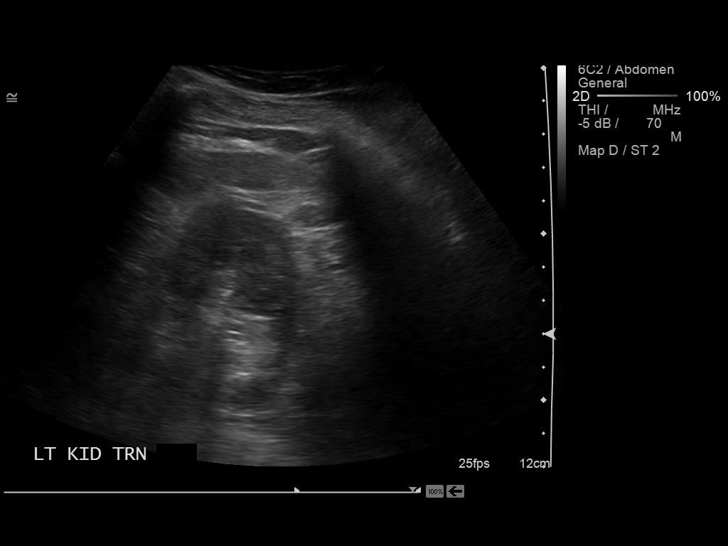
[im 75/75]
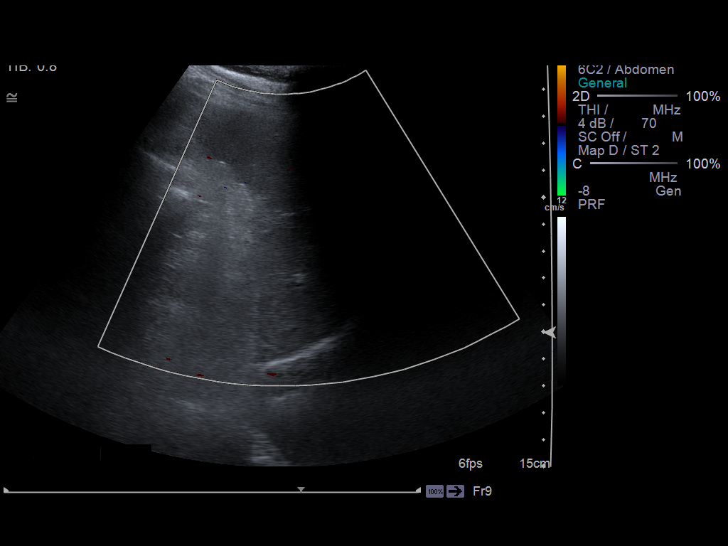

[13 of 25 positions shown; findings below may reference images not displayed]

FINDINGS: Gallbladder:  No shadowing gallstones or echogenic sludge.  No
gallbladder wall thickening or pericholecystic fluid.  Negative
sonographic Murphy's sign according to the ultrasound technologist.

Common bile duct:  4 mm within the porta hepatis.

Liver:  Normal size and echotexture without focal parenchymal
abnormality.  Patent portal vein with hepatopetal flow.

IVC:  Patent throughout its visualized course in the abdomen.

Pancreas:  Although the pancreas is difficult to visualize in its
entirety, no focal pancreatic abnormality is identified.

Spleen:  Normal size and echotexture without focal parenchymal
abnormality.

Right Kidney:  No hydronephrosis.  Well-preserved cortex.  Normal
size and parenchymal echotexture without focal abnormalities.

Left Kidney:  No hydronephrosis.  Well-preserved cortex.  Normal
size and parenchymal echotexture without focal abnormalities.

Abdominal aorta:  Cannot be visualized secondary to overlying bowel
gas.

Additional findings:  Bilateral pleural effusions.
IMPRESSION: 1.  No acute abnormality in the abdomen to account for the
patient's symptoms.
2.  Bilateral pleural effusions.

## 2012-05-23 ENCOUNTER — Encounter (HOSPITAL_COMMUNITY): Payer: Medicare Other

## 2012-05-23 DIAGNOSIS — D469 Myelodysplastic syndrome, unspecified: Secondary | ICD-10-CM

## 2012-05-23 LAB — COMPREHENSIVE METABOLIC PANEL
Alkaline Phosphatase: 75 U/L (ref 39–117)
BUN: 16 mg/dL (ref 6–23)
CO2: 29 mEq/L (ref 19–32)
Chloride: 103 mEq/L (ref 96–112)
GFR calc Af Amer: 55 mL/min — ABNORMAL LOW (ref >90)
GFR calc non Af Amer: 48 mL/min — ABNORMAL LOW (ref >90)
Glucose, Bld: 87 mg/dL (ref 70–99)
Potassium: 4.2 mEq/L (ref 3.5–5.1)
Total Bilirubin: 0.5 mg/dL (ref 0.3–1.2)
Total Protein: 7.3 g/dL (ref 6.0–8.3)

## 2012-05-23 NOTE — Progress Notes (Signed)
Luetta Nutting presented for labwork. Labs per MD order drawn via Peripheral Line 25 gauge needle inserted in RAC.  Procedure without incident.  Patient tolerated procedure well.

## 2012-05-30 ENCOUNTER — Other Ambulatory Visit (HOSPITAL_COMMUNITY): Payer: Self-pay | Admitting: Oncology

## 2012-05-30 DIAGNOSIS — D469 Myelodysplastic syndrome, unspecified: Secondary | ICD-10-CM

## 2012-05-30 MED ORDER — LENALIDOMIDE 2.5 MG PO CAPS: ORAL_CAPSULE | ORAL | Status: DC

## 2012-06-03 ENCOUNTER — Other Ambulatory Visit (HOSPITAL_COMMUNITY): Payer: Self-pay | Admitting: Oncology

## 2012-06-04 ENCOUNTER — Encounter (HOSPITAL_BASED_OUTPATIENT_CLINIC_OR_DEPARTMENT_OTHER): Payer: Medicare Other | Admitting: Oncology

## 2012-06-04 VITALS — BP 94/59 | HR 66 | Temp 97.6°F | Resp 20 | Wt 179.8 lb

## 2012-06-04 DIAGNOSIS — D46C Myelodysplastic syndrome with isolated del(5q) chromosomal abnormality: Secondary | ICD-10-CM

## 2012-06-04 DIAGNOSIS — D469 Myelodysplastic syndrome, unspecified: Secondary | ICD-10-CM

## 2012-06-04 DIAGNOSIS — R21 Rash and other nonspecific skin eruption: Secondary | ICD-10-CM

## 2012-06-04 NOTE — Progress Notes (Signed)
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 He is on Revlimid 2.5 mg daily for 21 days then off for 7 days. This he is tolerating much better but remains quite weak on each of the days that he takes the Revlimid. His daughter thinks that he needs a sitter for several hours during that time and it sounds like he is needed help long before now. I think this is reasonable. He does have a rash behind both knees slightly itchy to begin with now no longer pruritic. It is just slightly red not raised. It is limited only to these areas posteriorly and laterally to the knees. It extends for about 5 inches above the crease of the knee and just about an inch below.  He has no rash anywhere else.  He asked he looks better starting to feel better and his blood counts are definitely showing signs of improvement in his liver enzymes are fine. We will see him back in 8 weeks blood work every 28 days I would like to have that done beginning of each cycle which will start Monday. I want to see his rash at that time if he gets worse during Revlimid I want to see him during that active phase of the rash

## 2012-06-04 NOTE — Patient Instructions (Addendum)
[facility]  Discharge Instructions  RECOMMENDATIONS MADE BY THE CONSULTANT AND ANY TEST RESULTS WILL BE SENT TO YOUR REFERRING DOCTOR.   EXAM FINDINGS BY MD TODAY AND SIGNS AND SYMPTOMS TO REPORT TO CLINIC OR PRIMARY MD: Exam and discussion by MD.  Need to check your blood work on Monday and have your legs examined.  If rash worsens after you start the medication again we need to see it.  MEDICATIONS PRESCRIBED: none   INSTRUCTIONS GIVEN AND DISCUSSED: Other :  Report increased shortness of breath, increased fatigue or other problems.  SPECIAL INSTRUCTIONS/FOLLOW-UP: Lab work Needed every 28 days and Return to Clinic as scheduled.   I acknowledge that I have been informed and understand all the instructions given to me and received a copy. I do not have any more questions at this time, but understand that I may call the Specialty Clinic at [facility] at [phone] during business hours should I have any further questions or need assistance in obtaining follow-up care.    __________________________________________  _____________  __________ Signature of Patient or Authorized Representative            Date                   Time    __________________________________________ Nurse's Signature

## 2012-06-09 ENCOUNTER — Encounter (HOSPITAL_BASED_OUTPATIENT_CLINIC_OR_DEPARTMENT_OTHER): Payer: Medicare Other

## 2012-06-09 DIAGNOSIS — D469 Myelodysplastic syndrome, unspecified: Secondary | ICD-10-CM

## 2012-06-09 LAB — CBC
Platelets: 159 10*3/uL (ref 150–400)
RDW: 14.7 % (ref 11.5–15.5)
WBC: 8.1 10*3/uL (ref 4.0–10.5)

## 2012-06-09 LAB — COMPREHENSIVE METABOLIC PANEL
ALT: 12 U/L (ref 0–53)
AST: 15 U/L (ref 0–37)
Albumin: 3.3 g/dL — ABNORMAL LOW (ref 3.5–5.2)
CO2: 27 mEq/L (ref 19–32)
Calcium: 9.7 mg/dL (ref 8.4–10.5)
Chloride: 104 mEq/L (ref 96–112)
GFR calc non Af Amer: 48 mL/min — ABNORMAL LOW (ref >90)
Sodium: 141 mEq/L (ref 135–145)
Total Bilirubin: 0.5 mg/dL (ref 0.3–1.2)

## 2012-06-09 LAB — DIFFERENTIAL
Basophils Absolute: 0.1 10*3/uL (ref 0.0–0.1)
Basophils Relative: 1 % (ref 0–1)
Lymphocytes Relative: 35 % (ref 12–46)
Neutro Abs: 3.3 10*3/uL (ref 1.7–7.7)
Neutrophils Relative %: 40 % — ABNORMAL LOW (ref 43–77)

## 2012-06-09 NOTE — Progress Notes (Signed)
Labs drawn today for cbc/diff,cmp 

## 2012-06-11 ENCOUNTER — Other Ambulatory Visit: Payer: Self-pay | Admitting: Dermatology

## 2012-06-11 ENCOUNTER — Other Ambulatory Visit: Payer: Self-pay | Admitting: *Deleted

## 2012-06-11 MED ORDER — PRAVASTATIN SODIUM 40 MG PO TABS: 40.0000 mg | ORAL_TABLET | Freq: Every day | ORAL | Status: DC

## 2012-06-18 ENCOUNTER — Other Ambulatory Visit (HOSPITAL_COMMUNITY): Payer: Medicare Other

## 2012-06-20 ENCOUNTER — Telehealth (HOSPITAL_COMMUNITY): Payer: Self-pay | Admitting: *Deleted

## 2012-06-20 NOTE — Telephone Encounter (Signed)
Pt is going to require Mohs surgery for skin cancer. [name] wants to know if it is ok for him since he is on revlimid.

## 2012-07-07 ENCOUNTER — Encounter (HOSPITAL_COMMUNITY): Payer: Medicare Other | Attending: Oncology

## 2012-07-07 ENCOUNTER — Other Ambulatory Visit (HOSPITAL_COMMUNITY): Payer: Medicare Other

## 2012-07-07 DIAGNOSIS — D469 Myelodysplastic syndrome, unspecified: Secondary | ICD-10-CM | POA: Insufficient documentation

## 2012-07-07 LAB — COMPREHENSIVE METABOLIC PANEL
Albumin: 3.4 g/dL — ABNORMAL LOW (ref 3.5–5.2)
BUN: 19 mg/dL (ref 6–23)
Calcium: 10 mg/dL (ref 8.4–10.5)
Creatinine, Ser: 1.15 mg/dL (ref 0.50–1.35)
GFR calc Af Amer: 64 mL/min — ABNORMAL LOW (ref >90)
Glucose, Bld: 102 mg/dL — ABNORMAL HIGH (ref 70–99)
Total Protein: 7.1 g/dL (ref 6.0–8.3)

## 2012-07-07 LAB — DIFFERENTIAL
Basophils Relative: 2 % — ABNORMAL HIGH (ref 0–1)
Eosinophils Absolute: 0.7 10*3/uL (ref 0.0–0.7)
Eosinophils Relative: 10 % — ABNORMAL HIGH (ref 0–5)
Monocytes Absolute: 0.7 10*3/uL (ref 0.1–1.0)
Monocytes Relative: 10 % (ref 3–12)
Neutrophils Relative %: 39 % — ABNORMAL LOW (ref 43–77)

## 2012-07-07 LAB — CBC
HCT: 34.2 % — ABNORMAL LOW (ref 39.0–52.0)
Hemoglobin: 11.6 g/dL — ABNORMAL LOW (ref 13.0–17.0)
MCH: 31.9 pg (ref 26.0–34.0)
MCHC: 33.9 g/dL (ref 30.0–36.0)
MCV: 94 fL (ref 78.0–100.0)

## 2012-07-07 NOTE — Progress Notes (Signed)
Labs drawn today for cbc/diff,cmp 

## 2012-07-14 ENCOUNTER — Other Ambulatory Visit (HOSPITAL_COMMUNITY): Payer: Self-pay | Admitting: Oncology

## 2012-07-14 DIAGNOSIS — D469 Myelodysplastic syndrome, unspecified: Secondary | ICD-10-CM

## 2012-07-14 MED ORDER — LENALIDOMIDE 2.5 MG PO CAPS: ORAL_CAPSULE | ORAL | Status: DC

## 2012-07-16 ENCOUNTER — Other Ambulatory Visit (HOSPITAL_COMMUNITY): Payer: Medicare Other

## 2012-07-23 ENCOUNTER — Encounter (HOSPITAL_COMMUNITY): Payer: Medicare Other | Attending: Oncology | Admitting: Oncology

## 2012-07-23 VITALS — BP 99/61 | HR 75 | Temp 96.0°F | Resp 20 | Wt 180.0 lb

## 2012-07-23 DIAGNOSIS — L270 Generalized skin eruption due to drugs and medicaments taken internally: Secondary | ICD-10-CM | POA: Insufficient documentation

## 2012-07-23 DIAGNOSIS — D46C Myelodysplastic syndrome with isolated del(5q) chromosomal abnormality: Secondary | ICD-10-CM | POA: Insufficient documentation

## 2012-07-23 DIAGNOSIS — I10 Essential (primary) hypertension: Secondary | ICD-10-CM

## 2012-07-23 MED ORDER — PREDNISONE 10 MG PO TABS: ORAL_TABLET | ORAL | Status: DC

## 2012-07-23 NOTE — Progress Notes (Signed)
Lee Ramirez is here as a work in today do to a rash that erupted on Saturday 07/19/2012 after being on Revlimid for approximately 2 weeks time. He describes the rash as pruritic in nature and erythematous. He reports it is burning in certain places such as his thighs. He reports that he got worse on Sunday 07/20/2012.  He has stopped Revlimid for 2 days. He reports a rash is on his lower tremor these and starting on his upper extremities.  BP 99/61  Pulse 75  Temp 96 F (35.6 C) (Oral)  Resp 20  Wt 180 lb (81.647 kg)  Gen: Pleasant. Alert and oriented x3. No acute distress. HEENT: Atraumatic, normocephalic, anicteric sclera. Neck: Trachea midline, and supple Skin: Diffuse erythematous rash that is blanchable. The rash is patch-like with some satellite well demarcated lesions. There is a strong concentration of this rash on both thighs bilaterally but is also appreciated on lower extremities, upper extremities, back, and beginning to show up on his flank and abdomen and chest. Face is spared. Extremities: Described as above Neuro: Alert oriented x3. Pleasant. Normal gait. No focal deficits appreciated.  Assessment: 1. Revlimid induced skin rash 2. Myelodysplastic syndrome with 5Q minus syndrome. He was on Revlimid 2.5 mg daily for 21 days with 7 day respite.  Plan: 1. Discontinue Revlimid 2. Prednisone: 60 mg x3 days, 50 mg x3 days, 40 mg x3 days, 30 mg times one today, 20 mg x1 day, 10 mg x1 day. 3. Will investigate future therapeutic options. Discussed with pharmaceutical wrap for Pomalyst, and Pomalyst is not indicated for patients disease. Will share this information with Dr. Mariel Sleet and defer future treatment options to Dr. Mariel Sleet. 4. Return as scheduled next week to evaluate for resolution or improvement of rash.  All questions were answered. The patient knows to call the clinic with any problems, questions, or concerns.  Patient and plan discussed with Dr. Glenford Peers and  he is in agreement with the aforementioned. Patient seen and examined by Dr. Glenford Peers as well.  Nicolina Hirt

## 2012-07-23 NOTE — Patient Instructions (Addendum)
[facility] Discharge Instructions  RECOMMENDATIONS MADE BY THE CONSULTANT AND ANY TEST RESULTS WILL BE SENT TO YOUR REFERRING PHYSICIAN.    We will STOP the Revlimid for now. You may continue to use the Benadryl if you need it for itching. We will call in a Prednisone dose pack to [facility]. You will take the Prednisone as follows: Take 60mg  (6 tablets at one time) for 3 days,                                                     then      Take 50mg  (5 tablets at one time) for 3 days,                                                     then      Take 40mg  (4 tablets at one time) for 3 days,                                                     then      Take 30mg  (3 tablets at one time) for 1 day,                                                     then      Take 20mg  (2 tablets at one time) for 1 day,                                                     then      Take 10mg  (1 tablet) for 1 day,                                                                  STOP.  Return to clinic as scheduled next week for MD appointment. We will look into another medication to see if we can use it for your treatment.     Thank you for choosing [facility] to provide your oncology and hematology care.  To afford each patient quality time with our providers, please arrive at least 15 minutes before your scheduled appointment time.  With your help, our goal is to use those 15 minutes to complete the necessary work-up to ensure our physicians have the information they need to help with your evaluation and healthcare recommendations.    Effective January 1st, 2014, we ask that you re-schedule your appointment with our physicians should you arrive 10  or more minutes late for your appointment.  We strive to give you quality time with our providers, and arriving late affects you and other patients whose appointments are after yours.    Again, thank you for choosing  [facility].  Our hope is that these requests will decrease the amount of time that you wait before being seen by our physicians.       _____________________________________________________________  I acknowledge that I have been informed and understand all the instructions given to me and received a copy. I do not have anymore questions at this time but understand that I may call the Cancer Center at [facility] at [phone] during business hours should I have any further questions or need assistance in obtaining follow-up care.    __________________________________________  _____________  __________ Signature of Patient or Authorized Representative            Date                   Time    __________________________________________ Nurse's Signature

## 2012-07-23 NOTE — Progress Notes (Signed)
PRN visit check rash. Rash started on Sunday apx 14 days after starting Revlimid. Same type of rash as befor with Revlimid only this time worse and it spread down arms and legs. Pt states it itched terribly and he has been using benadryl. He did not take any Revlimid since Tuesday 07/22/12.

## 2012-07-30 ENCOUNTER — Encounter (HOSPITAL_BASED_OUTPATIENT_CLINIC_OR_DEPARTMENT_OTHER): Payer: Medicare Other | Admitting: Oncology

## 2012-07-30 VITALS — BP 151/79 | HR 66 | Resp 18 | Wt 179.2 lb

## 2012-07-30 DIAGNOSIS — D46C Myelodysplastic syndrome with isolated del(5q) chromosomal abnormality: Secondary | ICD-10-CM

## 2012-07-30 LAB — CBC WITH DIFFERENTIAL/PLATELET
Eosinophils Relative: 2 % (ref 0–5)
HCT: 35.5 % — ABNORMAL LOW (ref 39.0–52.0)
Lymphocytes Relative: 36 % (ref 12–46)
Lymphs Abs: 3.3 10*3/uL (ref 0.7–4.0)
MCV: 91.7 fL (ref 78.0–100.0)
Monocytes Absolute: 1.3 10*3/uL — ABNORMAL HIGH (ref 0.1–1.0)
Neutro Abs: 4.5 10*3/uL (ref 1.7–7.7)
RBC: 3.87 MIL/uL — ABNORMAL LOW (ref 4.22–5.81)
WBC: 9.2 10*3/uL (ref 4.0–10.5)

## 2012-07-30 NOTE — Progress Notes (Signed)
Lee Ramirez is here for follow-up a significant drug rash we saw him for last week.  It is thought that this is a drug rash from Revlimid.  This was discontinued and he was given a Prednisone with a taper.   He is on the 40 mg x 3 days part of his taper.  He should finish in 8-10 days.  He was encouraged to continue the prednisone as described.   After the patient was seen and discharged from the clinic, I spoke with Lee Ramirez.  He reports that he spoke with Lee Ramirez and it was recommended to consider Thalidomide.  I will also seek out data for 5q minus syndrome from [employer] for Pomalyst if available.   Lee Ramirez reports that he feels well.  The pruritis and burning resolved completely from the rash on the 2 or 3 day of Prednisone. Physical exam reveals significant improvement in the rash.  It is almost completely gone from his LE which were affected the worst.  He feels great.  Heart  Shows a RRR and Lungs are CTA B/L  So we will see him back in 2-3 weeks for follow-up.  Finish the Prednisone taper and then discuss our options going forward.   Patient and plan discussed with Lee Ramirez and he is in agreement with the aforementioned.   Lee Ramirez

## 2012-07-30 NOTE — Patient Instructions (Addendum)
[facility] Discharge Instructions  RECOMMENDATIONS MADE BY THE CONSULTANT AND ANY TEST RESULTS WILL BE SENT TO YOUR REFERRING PHYSICIAN.  EXAM FINDINGS BY THE PHYSICIAN TODAY AND SIGNS OR SYMPTOMS TO REPORT TO CLINIC OR PRIMARY PHYSICIAN:   MEDICATIONS PRESCRIBED:  Continue prednisone taper  INSTRUCTIONS GIVEN AND DISCUSSED: Call us if needed  SPECIAL INSTRUCTIONS/FOLLOW-UP: 2-3 weeks to see Dr. Mariel Sleet  Thank you for choosing [facility] to provide your oncology and hematology care.  To afford each patient quality time with our providers, please arrive at least 15 minutes before your scheduled appointment time.  With your help, our goal is to use those 15 minutes to complete the necessary work-up to ensure our physicians have the information they need to help with your evaluation and healthcare recommendations.    Effective January 1st, 2014, we ask that you re-schedule your appointment with our physicians should you arrive 10 or more minutes late for your appointment.  We strive to give you quality time with our providers, and arriving late affects you and other patients whose appointments are after yours.    Again, thank you for choosing [facility].  Our hope is that these requests will decrease the amount of time that you wait before being seen by our physicians.       _____________________________________________________________  I acknowledge that I have been informed and understand all the instructions given to me and received a copy. I do not have anymore questions at this time but understand that I may call the Cancer Center at [facility] at [phone] during business hours should I have any further questions or need assistance in obtaining follow-up care.    __________________________________________  _____________  __________ Signature of Patient or Authorized Representative            Date                    Time    __________________________________________ Nurse's Signature

## 2012-08-04 ENCOUNTER — Other Ambulatory Visit (HOSPITAL_COMMUNITY): Payer: Medicare Other

## 2012-08-14 ENCOUNTER — Other Ambulatory Visit (HOSPITAL_COMMUNITY): Payer: Medicare Other

## 2012-08-25 ENCOUNTER — Encounter (HOSPITAL_COMMUNITY): Payer: Self-pay | Admitting: Oncology

## 2012-08-25 ENCOUNTER — Encounter (HOSPITAL_COMMUNITY): Payer: Medicare Other | Attending: Oncology | Admitting: Oncology

## 2012-08-25 VITALS — BP 108/67 | HR 81 | Temp 97.1°F | Resp 18 | Wt 181.1 lb

## 2012-08-25 DIAGNOSIS — D469 Myelodysplastic syndrome, unspecified: Secondary | ICD-10-CM | POA: Insufficient documentation

## 2012-08-25 DIAGNOSIS — Z23 Encounter for immunization: Secondary | ICD-10-CM

## 2012-08-25 DIAGNOSIS — D46C Myelodysplastic syndrome with isolated del(5q) chromosomal abnormality: Secondary | ICD-10-CM

## 2012-08-25 MED ORDER — INFLUENZA VIRUS VACC SPLIT PF IM SUSP
INTRAMUSCULAR | Status: AC
  Filled 2012-08-25: qty 0.5

## 2012-08-25 MED ORDER — INFLUENZA VIRUS VACC SPLIT PF IM SUSP
0.5000 mL | INTRAMUSCULAR | Status: AC
  Administered 2012-08-25: 0.5 mL via INTRAMUSCULAR

## 2012-08-25 NOTE — Progress Notes (Signed)
Diagnoses #1 myelodysplastic syndrome consistent with the 5 q. minus syndrome. He developed Revlimid-induced urticaria which had to be stopped approximately one month ago. The rash has subsided completely. He feels great today. His blood work shows a normal white count hemoglobin 12.3 g which is much improved and a platelet count of 146,000 which is also much improved.  Now he is essentially asymptomatic. He does not want to consider 5 azacytidine, etc. I wonder if we could try thalidomide in the future and have placed a phone call to the company representative. We will see if they have any data from their past experiences with thalidomide or it's congeners. In the meantime we will check his blood work in 4 weeks and 8 weeks and see him in 8 weeks sooner if he becomes symptomatic He also needs the flu shot which she has not had thus far and we will get that to him today

## 2012-08-25 NOTE — Patient Instructions (Addendum)
[facility] Discharge Instructions  RECOMMENDATIONS MADE BY THE CONSULTANT AND ANY TEST RESULTS WILL BE SENT TO YOUR REFERRING PHYSICIAN.  EXAM FINDINGS BY THE PHYSICIAN TODAY AND SIGNS OR SYMPTOMS TO REPORT TO CLINIC OR PRIMARY PHYSICIAN: exam and discussion by MD.  MD will talk with [employer] representative about your therapy.  MEDICATIONS PRESCRIBED: none  INSTRUCTIONS GIVEN AND DISCUSSED: Report unusual bruising or bleeding.  SPECIAL INSTRUCTIONS/FOLLOW-UP: Blood work in 4 and 8 weeks and to be seen in follow-up after blood work in 8 weeks.  Thank you for choosing [facility] to provide your oncology and hematology care.  To afford each patient quality time with our providers, please arrive at least 15 minutes before your scheduled appointment time.  With your help, our goal is to use those 15 minutes to complete the necessary work-up to ensure our physicians have the information they need to help with your evaluation and healthcare recommendations.    Effective January 1st, 2014, we ask that you re-schedule your appointment with our physicians should you arrive 10 or more minutes late for your appointment.  We strive to give you quality time with our providers, and arriving late affects you and other patients whose appointments are after yours.    Again, thank you for choosing [facility].  Our hope is that these requests will decrease the amount of time that you wait before being seen by our physicians.       _____________________________________________________________  I acknowledge that I have been informed and understand all the instructions given to me and received a copy. I do not have any more questions at this time but understand that I may call the Cancer Center at [facility] at [phone] during business hours should I have any further questions or need assistance in obtaining follow-up care.

## 2012-09-22 ENCOUNTER — Encounter (HOSPITAL_COMMUNITY): Payer: Medicare Other | Attending: Oncology

## 2012-09-22 DIAGNOSIS — D46C Myelodysplastic syndrome with isolated del(5q) chromosomal abnormality: Secondary | ICD-10-CM | POA: Insufficient documentation

## 2012-09-22 LAB — COMPREHENSIVE METABOLIC PANEL
Albumin: 3.5 g/dL (ref 3.5–5.2)
BUN: 23 mg/dL (ref 6–23)
Creatinine, Ser: 1.28 mg/dL (ref 0.50–1.35)
Total Bilirubin: 0.5 mg/dL (ref 0.3–1.2)
Total Protein: 7.4 g/dL (ref 6.0–8.3)

## 2012-09-22 LAB — CBC WITH DIFFERENTIAL/PLATELET
Basophils Relative: 0 % (ref 0–1)
Eosinophils Absolute: 0.4 10*3/uL (ref 0.0–0.7)
HCT: 32.5 % — ABNORMAL LOW (ref 39.0–52.0)
Hemoglobin: 11 g/dL — ABNORMAL LOW (ref 13.0–17.0)
MCH: 32.1 pg (ref 26.0–34.0)
MCHC: 33.8 g/dL (ref 30.0–36.0)
Monocytes Absolute: 0.4 10*3/uL (ref 0.1–1.0)
Monocytes Relative: 7 % (ref 3–12)

## 2012-09-22 NOTE — Progress Notes (Signed)
Labs drawn today for cbc/diff,cmp 

## 2012-10-18 DIAGNOSIS — C4492 Squamous cell carcinoma of skin, unspecified: Secondary | ICD-10-CM

## 2012-10-18 HISTORY — DX: Squamous cell carcinoma of skin, unspecified: C44.92

## 2012-10-18 HISTORY — PX: SKIN CANCER EXCISION: SHX779

## 2012-10-20 ENCOUNTER — Encounter (HOSPITAL_COMMUNITY): Payer: Medicare Other | Attending: Oncology

## 2012-10-20 DIAGNOSIS — D469 Myelodysplastic syndrome, unspecified: Secondary | ICD-10-CM | POA: Insufficient documentation

## 2012-10-20 DIAGNOSIS — D46C Myelodysplastic syndrome with isolated del(5q) chromosomal abnormality: Secondary | ICD-10-CM | POA: Insufficient documentation

## 2012-10-20 LAB — COMPREHENSIVE METABOLIC PANEL
ALT: 10 U/L (ref 0–53)
Alkaline Phosphatase: 124 U/L — ABNORMAL HIGH (ref 39–117)
CO2: 26 mEq/L (ref 19–32)
Calcium: 9.6 mg/dL (ref 8.4–10.5)
Chloride: 101 mEq/L (ref 96–112)
GFR calc Af Amer: 55 mL/min — ABNORMAL LOW (ref >90)
GFR calc non Af Amer: 48 mL/min — ABNORMAL LOW (ref >90)
Glucose, Bld: 128 mg/dL — ABNORMAL HIGH (ref 70–99)
Sodium: 138 mEq/L (ref 135–145)
Total Bilirubin: 0.5 mg/dL (ref 0.3–1.2)

## 2012-10-20 LAB — CBC WITH DIFFERENTIAL/PLATELET
Eosinophils Relative: 4 % (ref 0–5)
HCT: 29.2 % — ABNORMAL LOW (ref 39.0–52.0)
Lymphocytes Relative: 34 % (ref 12–46)
Lymphs Abs: 2.2 10*3/uL (ref 0.7–4.0)
MCV: 96.1 fL (ref 78.0–100.0)
Neutro Abs: 3.3 10*3/uL (ref 1.7–7.7)
Platelets: 136 10*3/uL — ABNORMAL LOW (ref 150–400)
RBC: 3.04 MIL/uL — ABNORMAL LOW (ref 4.22–5.81)
WBC: 6.4 10*3/uL (ref 4.0–10.5)

## 2012-10-20 NOTE — Progress Notes (Signed)
Labs drawn today for cbc/diff,cmp 

## 2012-10-21 NOTE — Progress Notes (Signed)
[name], MD [address] [place] [address]  MDS (myelodysplastic syndrome) with 5q- syndrome  CURRENT THERAPY: On hold due to rash  INTERVAL HISTORY: Lee Ramirez 79 y.o. 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 He developed Revlimid-induced urticaria starting on 07/19/2012 and the Revlimid was stopped on 07/23/2012. The rash has subsided completely.  I personally reviewed and went over laboratory results with the patient.  The patient's labs reveal a worsening hemoglobin while the platelet count and WBC count remain stable.  Therefore it is time to talk about the next therapeutic option which is Thalidomide.  We will start on a low dose and titrate upwards.  We will start with 100 mg daily x 14 days and then increase to 200 mg daily.  He will return in 4 weeks for follow-up. Lee Ramirez is agreeable to this.  Lee Ramirez admits to continued, chronic fatigue. He reports that it may be slightly worse and this is likely related to his worsening anemia.  He is accompanied today by his friend.  Lee Ramirez continues to take care of his wife who is declining with regards to her dementia. Fortunately, his daughter is able to help from time to time.  I called [facility] to talk to them about the thalidomide. They think they can get this medication, but may thin reports that it costs about $7500 per 28 day supply. I discussed this with our financial counselor, April Manson, and she will contact the pharmaceutical company to see if there is any assistance available for the patient. I will await the results of this investigation.  Hematologically, [name] denies any complaints and ROS questioning is otherwise negative.   Past Medical History  Diagnosis Date  . ASCVD (arteriosclerotic cardiovascular disease)     Sizable non-Q. myocardial infarction in 09/2009; severe three-vessel  disease with an ejection fraction of 35%; three-vessel DES complicated by thrombocytopenia and mild renal insufficiency  . Hyperlipidemia     09/2010: TC-151, TG-411, H.-32, L.-not calculated   . Anemia     H/H -12.3/37.2; MCV-103; platelets-114 (08/2010)   . 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 Hypertension   . Orthostatic hypotension     Lightheaded; no syncope  . Thrombocytopenia, acquired     09/2010-114,000  . Hepatic steatosis 2013  . Thrombocytopenia     referred to hem-onc 5/13    has LEFT BUNDLE BRANCH BLOCK; ATHEROSCLEROTIC CARDIOVASCULAR DISEASE; Thrombocytopenia, acquired; Hyperlipidemia; Hypertension; Orthostatic hypotension; Anemia, macrocytic; Insomnia; GERD (gastroesophageal reflux disease); and MDS (myelodysplastic syndrome) with 5q- syndrome on his problem list.     is allergic to aspirin and revlimid.  Lee Ramirez does not currently have medications on file.  Past Surgical History  Procedure Laterality Date  . Inguinal hernia repair      Right  . Appendectomy    . Coronary angioplasty with stent placement    . Bone marrow biopsy  02/19/12  . Bone marrow aspiration  02/19/12    Denies any headaches, dizziness, double vision, fevers, chills, night sweats, nausea, vomiting, diarrhea, constipation, chest pain, heart palpitations, shortness of breath, blood in stool, black tarry stool, urinary pain, urinary burning, urinary frequency, hematuria.   PHYSICAL EXAMINATION  ECOG PERFORMANCE STATUS: 2 - Symptomatic, <50% confined to bed  Filed Vitals:   10/22/12 1426  BP: 93/61  Pulse: 79  Temp: 97.3 F (  36.3 C)  Resp: 16    GENERAL:alert, well developed, comfortable, cooperative, smiling and pale and chronically ill appearing. SKIN: skin color, texture, turgor are normal, no rashes or significant lesions HEAD: Normocephalic, No masses, lesions, tenderness or abnormalities.  Right anterior temporal hyperkeratosis area  and right bridge of nose.  EYES: normal, Conjunctiva are pink and non-injected EARS: External ears normal OROPHARYNX:mucous membranes are moist  NECK: supple, no adenopathy, thyroid normal size, non-tender, without nodularity, no stridor, non-tender, trachea midline LYMPH:  no palpable lymphadenopathy BREAST:not examined LUNGS: clear to auscultation and percussion HEART: regular rate & rhythm, no murmurs, no gallops, S1 normal and S2 normal ABDOMEN:abdomen soft, non-tender and normal bowel sounds BACK: Back symmetric, no curvature. EXTREMITIES:less then 2 second capillary refill, no joint deformities, effusion, or inflammation, no edema, no skin discoloration, no clubbing, no cyanosis  NEURO: alert & oriented x 3 with fluent speech, no focal motor/sensory deficits, gait normal with the assistance of a cane for stability    LABORATORY DATA: CBC    Component Value Date/Time   WBC 6.4 10/20/2012 1133   RBC 3.04* 10/20/2012 1133   HGB 9.7* 10/20/2012 1133   HCT 29.2* 10/20/2012 1133   PLT 136* 10/20/2012 1133   MCV 96.1 10/20/2012 1133   MCH 31.9 10/20/2012 1133   MCHC 33.2 10/20/2012 1133   RDW 15.8* 10/20/2012 1133   LYMPHSABS 2.2 10/20/2012 1133   MONOABS 0.6 10/20/2012 1133   EOSABS 0.3 10/20/2012 1133   BASOSABS 0.0 10/20/2012 1133      Chemistry      Component Value Date/Time   NA 138 10/20/2012 1133   K 4.1 10/20/2012 1133   CL 101 10/20/2012 1133   CO2 26 10/20/2012 1133   BUN 23 10/20/2012 1133   CREATININE 1.29 10/20/2012 1133   CREATININE 1.35 03/07/2012 1210      Component Value Date/Time   CALCIUM 9.6 10/20/2012 1133   ALKPHOS 124* 10/20/2012 1133   AST 11 10/20/2012 1133   ALT 10 10/20/2012 1133   BILITOT 0.5 10/20/2012 1133       PATHOLOGY: 02/19/2012  Diagnosis  Bone Marrow, Aspirate,Biopsy, and Clot, left iliac crest  - HYPERCELLULAR BONE MARROW FOR AGE WITH DYSPOIETIC CHANGES.  - SEE COMMENT.  PERIPHERAL BLOOD:  - MACROCYTIC ANEMIA  - THROMBOCYTOPENIA.  Diagnosis Note  The bone marrow is  generally hypercellular for age with variable dyspoietic changes of myeloid cell lines.  Megakaryocytes in particular show numerous hypolobated/unilobated forms. No morphologic increase in  blastic cells is identified. The overall morphologic features are worrisome for a clonal myelodysplastic state,  particularly refractory cytopenia with multilineage dysplasia. Correlation with cytogenetic studies is strongly  recommended. (BNS:kh 02-20-12)  Guerry Bruin MD  Pathologist, Electronic Signature  (Case signed 02/20/2012)    ASSESSMENT:  1. MDS with 5 q- syndrome.  Initially treated with Revlimid and then he developed Revlimid-induced urticaria.  This was discontinued.  No with recent worsening hemoglobin.  Will start Thalidomide. 2. Revlimid-induced urticaria requiring stopping this treatment. 3. Chronic fatigue. 4. Right anterior temporal hyperkeratosis area and right bridge of nose.  I have encouraged him to see Dermatology which he plans on doing in the near future.   PLAN:  1. I personally reviewed and went over laboratory results with the patient. 2. Thalidomide 100 mg daily x 14 days and then increase to 200 mg daily. 3. Labs in 3 and 5 weeks: CBC diff, CMET 4.  Follow-up with dermatology regarding facial lesions.  5. Return in  5 weeks for follow-up   All questions were answered. The patient knows to call the clinic with any problems, questions or concerns. We can certainly see the patient much sooner if necessary.  Patient and plan will be discussed with Dr. Mariel Sleet within the next 24 hours.    KEFALAS,THOMAS

## 2012-10-22 ENCOUNTER — Encounter (HOSPITAL_BASED_OUTPATIENT_CLINIC_OR_DEPARTMENT_OTHER): Payer: Medicare Other | Admitting: Oncology

## 2012-10-22 ENCOUNTER — Encounter (HOSPITAL_COMMUNITY): Payer: Self-pay | Admitting: Oncology

## 2012-10-22 VITALS — BP 93/61 | HR 79 | Temp 97.3°F | Resp 16 | Wt 179.5 lb

## 2012-10-22 DIAGNOSIS — D469 Myelodysplastic syndrome, unspecified: Secondary | ICD-10-CM

## 2012-10-22 DIAGNOSIS — D649 Anemia, unspecified: Secondary | ICD-10-CM

## 2012-10-22 DIAGNOSIS — L851 Acquired keratosis [keratoderma] palmaris et plantaris: Secondary | ICD-10-CM

## 2012-10-22 DIAGNOSIS — R5382 Chronic fatigue, unspecified: Secondary | ICD-10-CM

## 2012-10-22 DIAGNOSIS — D46C Myelodysplastic syndrome with isolated del(5q) chromosomal abnormality: Secondary | ICD-10-CM

## 2012-10-22 MED ORDER — THALIDOMIDE 100 MG PO CAPS: ORAL_CAPSULE | ORAL | Status: DC

## 2012-10-22 NOTE — Addendum Note (Signed)
Addended by: Ellouise Newer on: 10/22/2012 05:59 PM   Modules accepted: Level of Service

## 2012-10-22 NOTE — Patient Instructions (Addendum)
[facility] Discharge Instructions  RECOMMENDATIONS MADE BY THE CONSULTANT AND ANY TEST RESULTS WILL BE SENT TO YOUR REFERRING PHYSICIAN.  EXAM FINDINGS BY THE PHYSICIAN TODAY AND SIGNS OR SYMPTOMS TO REPORT TO CLINIC OR PRIMARY PHYSICIAN: Discussion by PA.  Plans are to get you started on Thalidomide as soon as we can get you enrolled into the program.    MEDICATIONS PRESCRIBED:  Thalidomide (once we get you enrolled)  You will start with 100mg  at bedtime for 14 days then go to 200 mg daily.  INSTRUCTIONS GIVEN AND DISCUSSED: Report increased fatigue or shortness of breath.  Call us when you start the Thalidomide.  SPECIAL INSTRUCTIONS/FOLLOW-UP: Blood work in 3 weeks and in 5 - 6 weeks.  We will see you back in follow-up in 5 - 6 weeks.  Thank you for choosing [facility] to provide your oncology and hematology care.  To afford each patient quality time with our providers, please arrive at least 15 minutes before your scheduled appointment time.  With your help, our goal is to use those 15 minutes to complete the necessary work-up to ensure our physicians have the information they need to help with your evaluation and healthcare recommendations.    Effective January 1st, 2014, we ask that you re-schedule your appointment with our physicians should you arrive 10 or more minutes late for your appointment.  We strive to give you quality time with our providers, and arriving late affects you and other patients whose appointments are after yours.    Again, thank you for choosing [facility].  Our hope is that these requests will decrease the amount of time that you wait before being seen by our physicians.       _____________________________________________________________  Should you have questions after your visit to [facility], please contact our office at [phone] between the hours of 8:30 a.m. and 5:00 p.m.  Voicemails left  after 4:30 p.m. will not be returned until the following business day.  For prescription refill requests, have your pharmacy contact our office with your prescription refill request.

## 2012-10-27 ENCOUNTER — Telehealth (HOSPITAL_COMMUNITY): Payer: Self-pay

## 2012-10-27 ENCOUNTER — Telehealth (HOSPITAL_COMMUNITY): Payer: Self-pay | Admitting: *Deleted

## 2012-10-27 NOTE — Telephone Encounter (Signed)
I spoke with patient's daughter Lee Ramirez and she said that she is aware of all 27 side effects associated with Thalidomide. She said she knew to watch for a rash, blood clots, & drowsiness as well as many others. She said that she knew to give the pill at night d/t it's ability to cause drowsiness. I tried to contact Lee Ramirez but there was no answer on the phone. She said that Lee Ramirez listened in on the phone conversation between the pharmacist and Lee Ramirez when the pharmacist from the drug company called Lee Ramirez. Lee Ramirez then went over the side effects and things to look for when taking this medication with Lee Ramirez (pt). She said she will contact us immediately if there is a problem with the Thalidomide. I told her that technically consent needed to be obtained the day they were here to see Lee Ramirez (a week or so ago). We will have [name] sign consent when he returns for next visit.

## 2012-10-27 NOTE — Telephone Encounter (Signed)
Message received from Vladimir Faster stating that Mr. Blank is suppose to get the Thalidomide delivered today and wants to know if he should start it.  Also wanted [place] to know that he was still weak and tired and that he had a fall on Friday but did not break anything.  Discussed with Dellis Anes, PA and patient is to start the Thalidomide tonight and patient notified.

## 2012-11-06 ENCOUNTER — Other Ambulatory Visit: Payer: Self-pay | Admitting: Dermatology

## 2012-11-07 ENCOUNTER — Ambulatory Visit (INDEPENDENT_AMBULATORY_CARE_PROVIDER_SITE_OTHER): Payer: Medicare Other | Admitting: Cardiology

## 2012-11-07 ENCOUNTER — Encounter: Payer: Self-pay | Admitting: Cardiology

## 2012-11-07 VITALS — BP 109/55 | HR 75 | Ht 74.0 in | Wt 181.8 lb

## 2012-11-07 DIAGNOSIS — D696 Thrombocytopenia, unspecified: Secondary | ICD-10-CM

## 2012-11-07 DIAGNOSIS — D539 Nutritional anemia, unspecified: Secondary | ICD-10-CM

## 2012-11-07 DIAGNOSIS — D6959 Other secondary thrombocytopenia: Secondary | ICD-10-CM

## 2012-11-07 DIAGNOSIS — I7090 Unspecified atherosclerosis: Secondary | ICD-10-CM

## 2012-11-07 DIAGNOSIS — I2510 Atherosclerotic heart disease of native coronary artery without angina pectoris: Secondary | ICD-10-CM

## 2012-11-07 DIAGNOSIS — I447 Left bundle-branch block, unspecified: Secondary | ICD-10-CM

## 2012-11-07 DIAGNOSIS — E785 Hyperlipidemia, unspecified: Secondary | ICD-10-CM

## 2012-11-07 DIAGNOSIS — I10 Essential (primary) hypertension: Secondary | ICD-10-CM

## 2012-11-07 DIAGNOSIS — D469 Myelodysplastic syndrome, unspecified: Secondary | ICD-10-CM

## 2012-11-07 DIAGNOSIS — I951 Orthostatic hypotension: Secondary | ICD-10-CM

## 2012-11-07 NOTE — Progress Notes (Deleted)
Name: Lee Ramirez    DOB: [birth year]  Age: 80 y.o.  MR#: [id number]       PCP:  [name], MD      Insurance: Payor: MEDICARE  Plan: MEDICARE PART A AND B  Product Type: *No Product type*    CC:   No chief complaint on file.  MEDICATION LIST  VS Filed Vitals:   11/07/12 1303  BP: 109/55  Pulse: 75  Height: 6\' 2"  (1.88 m)  Weight: 181 lb 12 oz (82.441 kg)    Weights Current Weight  11/07/12 181 lb 12 oz (82.441 kg)  10/22/12 179 lb 8 oz (81.421 kg)  08/25/12 181 lb 1.6 oz (82.146 kg)    Blood Pressure  BP Readings from Last 3 Encounters:  11/07/12 109/55  10/22/12 93/61  08/25/12 108/67     Admit date:  (Not on file) Last encounter with RMR:  05/15/2012   Allergy Aspirin and Revlimid  Current Outpatient Prescriptions  Medication Sig Dispense Refill  . acetaminophen (TYLENOL) 500 MG tablet Take 1,000 mg by mouth every 6 (six) hours as needed. Pain      . albuterol (PROAIR HFA) 108 (90 BASE) MCG/ACT inhaler Inhale 2 puffs into the lungs every 6 (six) hours as needed. For shortness of breath      . allopurinol (ZYLOPRIM) 300 MG tablet Take 150 mg by mouth daily.       . carvedilol (COREG) 12.5 MG tablet Take 0.5 tablets (6.25 mg total) by mouth 2 (two) times daily with a meal.  30 tablet  12  . CRANBERRY PO Take 1 capsule by mouth daily.      . furosemide (LASIX) 20 MG tablet Take 0.5 tablets (10 mg total) by mouth daily.  30 tablet  12  . lisinopril (PRINIVIL,ZESTRIL) 2.5 MG tablet Take 1 tablet (2.5 mg total) by mouth daily.  30 tablet  12  . meclizine (ANTIVERT) 12.5 MG tablet Take 12.5 mg by mouth 3 (three) times daily as needed. Dizziness      . Multiple Vitamins-Iron (DAILY-VITAMIN/IRON PO) Take 1 tablet by mouth daily.      . nitroGLYCERIN (NITROSTAT) 0.4 MG SL tablet Place 1 tablet (0.4 mg total) under the tongue every 5 (five) minutes as needed.  25 tablet  10  . omeprazole (PRILOSEC) 20 MG capsule Take 20 mg by mouth daily.      . ondansetron (ZOFRAN) 4  MG tablet Take 4 mg by mouth every 6 (six) hours.      . potassium chloride (MICRO-K) 10 MEQ CR capsule Take 1 capsule (10 mEq total) by mouth daily.  30 capsule  12  . pravastatin (PRAVACHOL) 40 MG tablet Take 1 tablet (40 mg total) by mouth daily.  30 tablet  6  . thalidomide (THALOMID) 100 MG capsule Take with water. Take 100 mg daily PO x 14 days and then 200 mg daily  60 capsule  0  . Travoprost, BAK Free, (TRAVATAN) 0.004 % SOLN ophthalmic solution Place 1 drop into both eyes at bedtime.       Marland Kitchen zolpidem (AMBIEN) 10 MG tablet Take 5 mg by mouth at bedtime as needed. For sleep       No current facility-administered medications for this visit.    Discontinued Meds:   There are no discontinued medications.  Patient Active Problem List  Diagnosis  . LEFT BUNDLE BRANCH BLOCK  . ATHEROSCLEROTIC CARDIOVASCULAR DISEASE  . Thrombocytopenia, acquired  . Hyperlipidemia  . Hypertension  .  Orthostatic hypotension  . Anemia, macrocytic  . Insomnia  . GERD (gastroesophageal reflux disease)  . MDS (myelodysplastic syndrome) with 5q- syndrome    LABS    Component Value Date/Time   NA 138 10/20/2012 1133   NA 142 09/22/2012 1033   NA 141 07/07/2012 1055   K 4.1 10/20/2012 1133   K 3.9 09/22/2012 1033   K 3.8 07/07/2012 1055   CL 101 10/20/2012 1133   CL 105 09/22/2012 1033   CL 104 07/07/2012 1055   CO2 26 10/20/2012 1133   CO2 27 09/22/2012 1033   CO2 28 07/07/2012 1055   GLUCOSE 128* 10/20/2012 1133   GLUCOSE 110* 09/22/2012 1033   GLUCOSE 102* 07/07/2012 1055   BUN 23 10/20/2012 1133   BUN 23 09/22/2012 1033   BUN 19 07/07/2012 1055   CREATININE 1.29 10/20/2012 1133   CREATININE 1.28 09/22/2012 1033   CREATININE 1.15 07/07/2012 1055   CREATININE 1.35 03/07/2012 1210   CREATININE 1.65* 01/11/2012 1518   CREATININE 1.11 08/09/2011 0925   CALCIUM 9.6 10/20/2012 1133   CALCIUM 10.0 09/22/2012 1033   CALCIUM 10.0 07/07/2012 1055   GFRNONAA 48* 10/20/2012 1133   GFRNONAA 48* 09/22/2012 1033   GFRNONAA 55*  07/07/2012 1055   GFRAA 55* 10/20/2012 1133   GFRAA 56* 09/22/2012 1033   GFRAA 64* 07/07/2012 1055   CMP     Component Value Date/Time   NA 138 10/20/2012 1133   K 4.1 10/20/2012 1133   CL 101 10/20/2012 1133   CO2 26 10/20/2012 1133   GLUCOSE 128* 10/20/2012 1133   BUN 23 10/20/2012 1133   CREATININE 1.29 10/20/2012 1133   CREATININE 1.35 03/07/2012 1210   CALCIUM 9.6 10/20/2012 1133   PROT 7.3 10/20/2012 1133   ALBUMIN 3.3* 10/20/2012 1133   AST 11 10/20/2012 1133   ALT 10 10/20/2012 1133   ALKPHOS 124* 10/20/2012 1133   BILITOT 0.5 10/20/2012 1133   GFRNONAA 48* 10/20/2012 1133   GFRAA 55* 10/20/2012 1133       Component Value Date/Time   WBC 6.4 10/20/2012 1133   WBC 5.8 09/22/2012 1033   WBC 9.2 07/30/2012 1049   HGB 9.7* 10/20/2012 1133   HGB 11.0* 09/22/2012 1033   HGB 12.3* 07/30/2012 1049   HCT 29.2* 10/20/2012 1133   HCT 32.5* 09/22/2012 1033   HCT 35.5* 07/30/2012 1049   MCV 96.1 10/20/2012 1133   MCV 94.8 09/22/2012 1033   MCV 91.7 07/30/2012 1049    Lipid Panel     Component Value Date/Time   CHOL 169 10/18/2010 1919   TRIG 256* 10/18/2010 1919   HDL 78 10/18/2010 1919   CHOLHDL 2.2 Ratio 10/18/2010 1919   VLDL 51* 10/18/2010 1919   LDLCALC 40 10/18/2010 1919    ABG No results found for this basename: phart, pco2, pco2art, po2, po2art, hco3, tco2, acidbasedef, o2sat     Lab Results  Component Value Date   TSH 4.343 11/25/2011   BNP (last 3 results) No results found for this basename: PROBNP,  in the last 8760 hours Cardiac Panel (last 3 results) No results found for this basename: CKTOTAL, CKMB, TROPONINI, RELINDX,  in the last 72 hours  Iron/TIBC/Ferritin    Component Value Date/Time   IRON 119 02/11/2012 1019   TIBC 287 02/11/2012 1019   FERRITIN 130 02/11/2012 1019     EKG Orders placed in visit on 11/07/12  . EKG 12-LEAD     Prior Assessment and Plan Problem List as  of 11/07/2012     ICD-9-CM     Cardiology Problems   LEFT BUNDLE BRANCH BLOCK   ATHEROSCLEROTIC CARDIOVASCULAR  DISEASE   Last Assessment & Plan   05/15/2012 Office Visit Edited 05/20/2012  9:54 PM by Kathlen Brunswick, MD     No symptoms at present to suggest myocardial ischemia.  Our principal goal at this patient's advanced age is to optimize cardiac function and avoid clinical congestive heart failure.  Dosage of cardiac medications not optimal, but cannot be increased due to orthostatic hypotension.    Hyperlipidemia   Last Assessment & Plan   11/15/2011 Office Visit Written 11/16/2011  3:30 PM by Kathlen Brunswick, MD     Lipid profile was excellent one year ago except for elevated triglycerides.  We will continue to monitor effectiveness of lipid lowering therapy.    Hypertension   Last Assessment & Plan   05/15/2012 Office Visit Written 05/15/2012  2:35 PM by Kathlen Brunswick, MD     No recent significant elevation in blood pressure.  No specific antihypertensive therapy is required, as BP is adequately controlled with medications required for treatment of his cardiomyopathy.    Orthostatic hypotension   Last Assessment & Plan   03/20/2012 Office Visit Written 03/20/2012  5:18 PM by Jodelle Gross, NP     Is symptomatically better with changes in medication doses, he remains orthostatic with blood pressure dropping from 127-96 systolically from lying to standing. He did have some dizziness. Heart rate was stable. We will not change any of his medications as he is essentially asymptomatic despite a blood pressure drop at this time. He is to followup with Dr. Dietrich Pates in approximately one month for continued evaluation. He is to avoid prolonged standing. He is to take his time getting from a sitting to standing position.      Other   Thrombocytopenia, acquired   Last Assessment & Plan   07/16/2011 Office Visit Written 07/16/2011  7:29 PM by Kathlen Brunswick, MD     Mild thrombocytopenia is stable to improved.    Anemia, macrocytic   Last Assessment & Plan   05/15/2012 Office Visit Written  05/15/2012  2:33 PM by Kathlen Brunswick, MD     Symptoms improved following dose adjustment of Revlimid and transfusion.    Insomnia   GERD (gastroesophageal reflux disease)   MDS (myelodysplastic syndrome) with 5q- syndrome       Imaging: No results found.

## 2012-11-07 NOTE — Assessment & Plan Note (Signed)
Mild symptoms and orthostatic change in blood pressure persist, currently at an acceptable level.

## 2012-11-07 NOTE — Assessment & Plan Note (Addendum)
Patient receiving treatment with a reasonable dose of statin. Due to advanced age, I am not inclined to monitor serum lipid levels.

## 2012-11-07 NOTE — Assessment & Plan Note (Signed)
Hypertension has not been an issue since patient has developed severe cardiomyopathy. All antihypertensive drugs are also been utilized to treat left ventricular dysfunction.

## 2012-11-07 NOTE — Assessment & Plan Note (Signed)
Recent mild exacerbation of anemia prompting resumption of treatment with thalidomide. Patient is followed closely by Dr. Mariel Sleet.

## 2012-11-07 NOTE — Assessment & Plan Note (Addendum)
No symptoms to suggest progression of disease. Despite severely impaired left ventricular systolic function, patient is well compensated at the present time. Dosage of cardiac medications limited by low blood pressure and mild orthostatic change. Current regimen will be continued.  Instructions provided regarding orthostatic dizziness and appropriate response to avoid falls.

## 2012-11-07 NOTE — Progress Notes (Signed)
Patient ID: CALHOUN REICHARDT, male   DOB: June 26, 1922, 79 y.o.   MRN: [id number]  HPI: Schedule return visit for very nice elderly gentleman with coronary artery disease, multiple cardiovascular risk factors and myelodysplastic syndrome. He is currently treated with thalidomide and apparently has had a good response, as he has not required transfusion in some time, but he does report generalized weakness. Weight has been maintained, and appetite has been good.  Current Outpatient Prescriptions  Medication Sig Dispense Refill  . acetaminophen (TYLENOL) 500 MG tablet Take 1,000 mg by mouth every 6 (six) hours as needed. Pain      . albuterol (PROAIR HFA) 108 (90 BASE) MCG/ACT inhaler Inhale 2 puffs into the lungs every 6 (six) hours as needed. For shortness of breath      . allopurinol (ZYLOPRIM) 300 MG tablet Take 150 mg by mouth daily.       . carvedilol (COREG) 12.5 MG tablet Take 0.5 tablets (6.25 mg total) by mouth 2 (two) times daily with a meal.  30 tablet  12  . CRANBERRY PO Take 1 capsule by mouth daily.      . furosemide (LASIX) 20 MG tablet Take 0.5 tablets (10 mg total) by mouth daily.  30 tablet  12  . lisinopril (PRINIVIL,ZESTRIL) 2.5 MG tablet Take 1 tablet (2.5 mg total) by mouth daily.  30 tablet  12  . meclizine (ANTIVERT) 12.5 MG tablet Take 12.5 mg by mouth 3 (three) times daily as needed. Dizziness      . Multiple Vitamins-Iron (DAILY-VITAMIN/IRON PO) Take 1 tablet by mouth daily.      . nitroGLYCERIN (NITROSTAT) 0.4 MG SL tablet Place 1 tablet (0.4 mg total) under the tongue every 5 (five) minutes as needed.  25 tablet  10  . omeprazole (PRILOSEC) 20 MG capsule Take 20 mg by mouth daily.      . ondansetron (ZOFRAN) 4 MG tablet Take 4 mg by mouth every 6 (six) hours.      . potassium chloride (MICRO-K) 10 MEQ CR capsule Take 1 capsule (10 mEq total) by mouth daily.  30 capsule  12  . pravastatin (PRAVACHOL) 40 MG tablet Take 1 tablet (40 mg total) by mouth daily.  30 tablet  6  .  thalidomide (THALOMID) 100 MG capsule Take with water. Take 100 mg daily PO x 14 days and then 200 mg daily  60 capsule  0  . Travoprost, BAK Free, (TRAVATAN) 0.004 % SOLN ophthalmic solution Place 1 drop into both eyes at bedtime.       Marland Kitchen zolpidem (AMBIEN) 10 MG tablet Take 5 mg by mouth at bedtime as needed. For sleep       No current facility-administered medications for this visit.   Allergies  Allergen Reactions  . Aspirin Anaphylaxis  . Revlimid (Lenalidomide) Rash     Past medical history, social history, and family history reviewed and updated.  ROS: Denies dyspnea, chest pain, palpitations, syncope or falls. He intermittently experiences orthostatic dizziness that passes quickly after he stands. All other systems reviewed and are negative except as noted above.  PHYSICAL EXAM: BP 109/55  Pulse 75  Ht 6\' 2"  (1.88 m)  Wt 82.441 kg (181 lb 12 oz)  BMI 23.33 kg/m2;  Body mass index is 23.33 kg/(m^2). General-Well developed; no acute distress Body habitus-proportionate weight and height Neck-No JVD; no carotid bruits Lungs-clear lung fields; resonant to percussion Cardiovascular-normal PMI; distant S1 and S2 Abdomen-normal bowel sounds; soft and non-tender without masses or  organomegaly Musculoskeletal-No deformities, no cyanosis or clubbing Neurologic-Normal cranial nerves; symmetric strength and tone Skin-Warm, pallor; ulcerated papules over the angle of thoracic mandible ; excisional biopsy site over the bridge of the nose on the right Extremities-distal pulses intact; no edema  EKG: Normal sinus rhythm; first-degree AV block; left axis deviation-left anterior fascicular block; IVCD; markedly delayed R-wave progression; minor ST segment abnormalities in the lateral leads. No previous tracing for comparison.  [place] Bing, MD 11/07/2012  1:22 PM  ASSESSMENT AND PLAN

## 2012-11-07 NOTE — Assessment & Plan Note (Signed)
Thrombocytopenia also improved with current therapy.

## 2012-11-07 NOTE — Patient Instructions (Addendum)
Your physician recommends that you schedule a follow-up appointment in: 6 MONTHS 

## 2012-11-11 ENCOUNTER — Other Ambulatory Visit (HOSPITAL_COMMUNITY): Payer: Self-pay | Admitting: Oncology

## 2012-11-11 DIAGNOSIS — D469 Myelodysplastic syndrome, unspecified: Secondary | ICD-10-CM

## 2012-11-11 MED ORDER — THALIDOMIDE 100 MG PO CAPS: 200.0000 mg | ORAL_CAPSULE | Freq: Every day | ORAL | Status: DC

## 2012-11-12 ENCOUNTER — Encounter (HOSPITAL_BASED_OUTPATIENT_CLINIC_OR_DEPARTMENT_OTHER): Payer: Medicare Other

## 2012-11-12 DIAGNOSIS — D469 Myelodysplastic syndrome, unspecified: Secondary | ICD-10-CM

## 2012-11-12 LAB — COMPREHENSIVE METABOLIC PANEL
Alkaline Phosphatase: 146 U/L — ABNORMAL HIGH (ref 39–117)
BUN: 20 mg/dL (ref 6–23)
Chloride: 103 mEq/L (ref 96–112)
Creatinine, Ser: 1.27 mg/dL (ref 0.50–1.35)
GFR calc Af Amer: 56 mL/min — ABNORMAL LOW (ref >90)
Glucose, Bld: 139 mg/dL — ABNORMAL HIGH (ref 70–99)
Potassium: 4.1 mEq/L (ref 3.5–5.1)
Total Bilirubin: 0.4 mg/dL (ref 0.3–1.2)
Total Protein: 6.8 g/dL (ref 6.0–8.3)

## 2012-11-12 LAB — CBC WITH DIFFERENTIAL/PLATELET
Eosinophils Absolute: 0.6 10*3/uL (ref 0.0–0.7)
HCT: 29.1 % — ABNORMAL LOW (ref 39.0–52.0)
Hemoglobin: 9.6 g/dL — ABNORMAL LOW (ref 13.0–17.0)
Lymphs Abs: 1.6 10*3/uL (ref 0.7–4.0)
MCH: 32.7 pg (ref 26.0–34.0)
MCHC: 33 g/dL (ref 30.0–36.0)
Monocytes Absolute: 0.5 10*3/uL (ref 0.1–1.0)
Monocytes Relative: 9 % (ref 3–12)
Neutrophils Relative %: 52 % (ref 43–77)
RBC: 2.94 MIL/uL — ABNORMAL LOW (ref 4.22–5.81)

## 2012-11-12 NOTE — Progress Notes (Signed)
Labs drawn today for cbc/diff,cmp 

## 2012-11-18 ENCOUNTER — Telehealth (HOSPITAL_COMMUNITY): Payer: Self-pay

## 2012-11-18 ENCOUNTER — Encounter (HOSPITAL_COMMUNITY): Payer: Self-pay

## 2012-11-18 NOTE — Telephone Encounter (Signed)
Since increasing Thalidomide to 200 mg daily complains with increased weakness.  Denies and dyspnea.  States "I did ok with 1 daily but I'm really weak taking two daily."

## 2012-11-18 NOTE — Telephone Encounter (Signed)
What do you think??

## 2012-11-18 NOTE — Telephone Encounter (Signed)
Per Dr. Mariel Sleet, patient is to stop the Thalidomide for 7 days then resume at 100mg  daily.  Patient notified and verbalized understanding.

## 2012-11-26 ENCOUNTER — Encounter (HOSPITAL_COMMUNITY): Payer: Medicare Other | Attending: Oncology

## 2012-11-26 DIAGNOSIS — D469 Myelodysplastic syndrome, unspecified: Secondary | ICD-10-CM

## 2012-11-26 DIAGNOSIS — D46C Myelodysplastic syndrome with isolated del(5q) chromosomal abnormality: Secondary | ICD-10-CM

## 2012-11-26 LAB — COMPREHENSIVE METABOLIC PANEL
ALT: 8 U/L (ref 0–53)
AST: 11 U/L (ref 0–37)
Alkaline Phosphatase: 169 U/L — ABNORMAL HIGH (ref 39–117)
CO2: 27 mEq/L (ref 19–32)
Calcium: 9.4 mg/dL (ref 8.4–10.5)
Chloride: 101 mEq/L (ref 96–112)
GFR calc non Af Amer: 38 mL/min — ABNORMAL LOW (ref >90)
Potassium: 4.5 mEq/L (ref 3.5–5.1)
Sodium: 137 mEq/L (ref 135–145)
Total Bilirubin: 0.4 mg/dL (ref 0.3–1.2)

## 2012-11-26 LAB — CBC WITH DIFFERENTIAL/PLATELET
Basophils Absolute: 0.1 10*3/uL (ref 0.0–0.1)
Lymphocytes Relative: 35 % (ref 12–46)
Neutro Abs: 3 10*3/uL (ref 1.7–7.7)
Neutrophils Relative %: 43 % (ref 43–77)
Platelets: 114 10*3/uL — ABNORMAL LOW (ref 150–400)
RBC: 2.97 MIL/uL — ABNORMAL LOW (ref 4.22–5.81)
RDW: 15.3 % (ref 11.5–15.5)
WBC: 7.1 10*3/uL (ref 4.0–10.5)

## 2012-11-26 NOTE — Progress Notes (Signed)
Labs drawn today for cbc/diff,cmp 

## 2012-12-01 ENCOUNTER — Ambulatory Visit (HOSPITAL_COMMUNITY): Payer: Medicare Other | Admitting: Oncology

## 2012-12-01 NOTE — Progress Notes (Signed)
no show

## 2012-12-08 ENCOUNTER — Other Ambulatory Visit (HOSPITAL_COMMUNITY): Payer: Self-pay | Admitting: Oncology

## 2012-12-08 DIAGNOSIS — D469 Myelodysplastic syndrome, unspecified: Secondary | ICD-10-CM

## 2012-12-08 MED ORDER — THALIDOMIDE 100 MG PO CAPS: 100.0000 mg | ORAL_CAPSULE | Freq: Every day | ORAL | Status: DC

## 2012-12-09 ENCOUNTER — Other Ambulatory Visit (HOSPITAL_COMMUNITY): Payer: Self-pay | Admitting: Oncology

## 2012-12-09 DIAGNOSIS — D469 Myelodysplastic syndrome, unspecified: Secondary | ICD-10-CM

## 2012-12-09 MED ORDER — THALIDOMIDE 100 MG PO CAPS: 100.0000 mg | ORAL_CAPSULE | Freq: Every day | ORAL | Status: DC

## 2012-12-10 ENCOUNTER — Telehealth (HOSPITAL_COMMUNITY): Payer: Self-pay | Admitting: Oncology

## 2012-12-10 ENCOUNTER — Encounter (HOSPITAL_COMMUNITY): Payer: Self-pay | Admitting: Oncology

## 2012-12-10 NOTE — Telephone Encounter (Signed)
Received phone call from daughter, Lee Ramirez, stating her father has been complaining of fatigue, generalized weakness, and consitpation - also that he missed his MD appointment for last week.  New appointment made for 12/19/12, and [name] is aware and states she will ensure he comes in for that visit.  I attempted to contact Luetta Nutting w/o success, and I left him a message on his answering machine to return a call to our clinic asap.  I advised his daughter, should she speak to him before we do, to please ensure he gets in contact with our clinic so we can assess his needs - verbalized understanding.

## 2012-12-18 ENCOUNTER — Other Ambulatory Visit: Payer: Self-pay | Admitting: Dermatology

## 2012-12-19 ENCOUNTER — Encounter (HOSPITAL_COMMUNITY): Payer: Self-pay | Admitting: Oncology

## 2012-12-19 ENCOUNTER — Encounter (HOSPITAL_COMMUNITY): Payer: Medicare Other | Attending: Oncology | Admitting: Oncology

## 2012-12-19 VITALS — BP 113/67 | HR 69 | Temp 97.7°F | Resp 16 | Wt 179.5 lb

## 2012-12-19 DIAGNOSIS — L509 Urticaria, unspecified: Secondary | ICD-10-CM

## 2012-12-19 DIAGNOSIS — R5383 Other fatigue: Secondary | ICD-10-CM

## 2012-12-19 DIAGNOSIS — D46C Myelodysplastic syndrome with isolated del(5q) chromosomal abnormality: Secondary | ICD-10-CM | POA: Insufficient documentation

## 2012-12-19 DIAGNOSIS — R5381 Other malaise: Secondary | ICD-10-CM

## 2012-12-19 DIAGNOSIS — D469 Myelodysplastic syndrome, unspecified: Secondary | ICD-10-CM

## 2012-12-19 LAB — CBC WITH DIFFERENTIAL/PLATELET
Basophils Relative: 2 % — ABNORMAL HIGH (ref 0–1)
Eosinophils Relative: 10 % — ABNORMAL HIGH (ref 0–5)
HCT: 29.4 % — ABNORMAL LOW (ref 39.0–52.0)
Hemoglobin: 9.9 g/dL — ABNORMAL LOW (ref 13.0–17.0)
Lymphocytes Relative: 41 % (ref 12–46)
MCHC: 33.7 g/dL (ref 30.0–36.0)
MCV: 98.7 fL (ref 78.0–100.0)
Monocytes Absolute: 0.8 10*3/uL (ref 0.1–1.0)
Monocytes Relative: 12 % (ref 3–12)
Neutro Abs: 2.5 10*3/uL (ref 1.7–7.7)
RDW: 16.3 % — ABNORMAL HIGH (ref 11.5–15.5)

## 2012-12-19 LAB — COMPREHENSIVE METABOLIC PANEL
BUN: 18 mg/dL (ref 6–23)
CO2: 25 mEq/L (ref 19–32)
Calcium: 9.1 mg/dL (ref 8.4–10.5)
Chloride: 106 mEq/L (ref 96–112)
Creatinine, Ser: 1.15 mg/dL (ref 0.50–1.35)
GFR calc non Af Amer: 55 mL/min — ABNORMAL LOW (ref >90)
Total Bilirubin: 0.4 mg/dL (ref 0.3–1.2)

## 2012-12-19 NOTE — Addendum Note (Signed)
Addended by: Edythe Lynn A on: 12/19/2012 04:34 PM   Modules accepted: Orders

## 2012-12-19 NOTE — Patient Instructions (Addendum)
[facility] Discharge Instructions  RECOMMENDATIONS MADE BY THE CONSULTANT AND ANY TEST RESULTS WILL BE SENT TO YOUR REFERRING PHYSICIAN.  MEDICATIONS PRESCRIBED:  None.  We will not start Aranesp at this time per conversation with PA-C.  This is an every 3 week injection to help boost your hemoglobin.  We can start this therapy in the future depending on your lab results.   INSTRUCTIONS GIVEN AND DISCUSSED: Continue with Thalidomide 100 mg (1 tablet/capsule) daily. Lab work every 3 weeks.  Labs today.  SPECIAL INSTRUCTIONS/FOLLOW-UP: Return in 6 weeks for follow-up.  Thank you for choosing [facility] to provide your oncology and hematology care.  To afford each patient quality time with our providers, please arrive at least 15 minutes before your scheduled appointment time.  With your help, our goal is to use those 15 minutes to complete the necessary work-up to ensure our physicians have the information they need to help with your evaluation and healthcare recommendations.    Effective January 1st, 2014, we ask that you re-schedule your appointment with our physicians should you arrive 10 or more minutes late for your appointment.  We strive to give you quality time with our providers, and arriving late affects you and other patients whose appointments are after yours.    Again, thank you for choosing [facility].  Our hope is that these requests will decrease the amount of time that you wait before being seen by our physicians.       _____________________________________________________________  Should you have questions after your visit to [facility], please contact our office at [phone] between the hours of 8:30 a.m. and 5:00 p.m.  Voicemails left after 4:30 p.m. will not be returned until the following business day.  For prescription refill requests, have your pharmacy contact our office with your prescription refill  request.

## 2012-12-19 NOTE — Progress Notes (Signed)
Lee Ruths, MD [address] [place] [address]  MDS (myelodysplastic syndrome) with 5q- syndrome  CURRENT THERAPY:Thalidomide 100 mg daily  INTERVAL HISTORY: Lee Ramirez 79 y.o. 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 He developed Lee Ramirez-induced urticaria starting on 07/19/2012 and the Lee Ramirez was stopped on 07/23/2012. The rash has subsided completely.  He was recently seen by his cardiologist, Dr. Dietrich Ramirez who was pleased from a cardiac standpoint.   He recently saw his dermatologist yesterday who removed a lesion on his right temporal area of head.  He is healing nicely from that procedure with healthy granulating tissue.  He has not yet learned of the pathology from that procedure as it is pending.   He continues to complain of his chronic fatigue.  This is multifactorial with cardiac dysfunction, MDS and its treatment, etc.  Anemia is stable.   I personally reviewed and went over laboratory results with the patient.  His Hgb is stable and his platelets are improved some.   We spent some time discussing the option of Aranesp therapy every 3 weeks for his anemia.  We discussed the risks, benefits, alternatives, and side effects of this therapy.  I am unable to guarantee that this therapy would improve his fatigue significantly. We discussed the pros and cons and together we have decided to hold off on the Aranesp injections at this time but we can certainly institute this therapy in the future.  Hematologically, he denies any complaints and ROS questioning is negative.     Past Medical History  Diagnosis Date  . ASCVD (arteriosclerotic cardiovascular disease)     Sizable non-Q. myocardial infarction in 09/2009; severe three-vessel disease with an ejection fraction of 35%; three-vessel DES complicated by thrombocytopenia and mild renal insufficiency  . Hyperlipidemia      09/2010: TC-151, TG-411, H.-32, L.-not calculated   . Anemia     H/H -12.3/37.2; MCV-103; platelets-114 (08/2010)   . 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 Hypertension   . Orthostatic hypotension     Lightheaded; no syncope  . Thrombocytopenia, acquired     09/2010-114,000  . Hepatic steatosis 2013  . Thrombocytopenia     referred to hem-onc 5/13  . Squamous cell skin cancer 10/2012    of nose    has Arteriosclerotic cardiovascular disease (ASCVD)-ischemic cardiomyopathy; Thrombocytopenia, acquired; Hyperlipidemia; Hypertension; Orthostatic hypotension; Anemia, macrocytic; Insomnia; GERD (gastroesophageal reflux disease); and MDS (myelodysplastic syndrome) with 5q- syndrome on his problem list.     is allergic to aspirin and Lee Ramirez.  Lee Ramirez does not currently have medications on file.  Past Surgical History  Procedure Laterality Date  . Inguinal hernia repair      Right  . Appendectomy    . Coronary angioplasty with stent placement    . Bone marrow biopsy  02/19/12  . Bone marrow aspiration  02/19/12  . Skin cancer excision  10/2012    Denies any headaches, dizziness, double vision, fevers, chills, night sweats, nausea, vomiting, diarrhea, constipation, chest pain, heart palpitations, shortness of breath, blood in stool, black tarry stool, urinary pain, urinary burning, urinary frequency, hematuria.   PHYSICAL EXAMINATION  ECOG PERFORMANCE STATUS: 2 - Symptomatic, <50% confined to bed  Filed Vitals:   12/19/12 1517  BP: 113/67  Pulse: 69  Temp: 97.7 F (36.5 C)  Resp: 16    GENERAL:alert, well  developed, comfortable, cooperative, smiling and pale and chronically ill appearing.  SKIN: skin color, texture, turgor are normal, no rashes or significant lesions  HEAD: Normocephalic, No masses, lesions, tenderness or abnormalities. Right anterior temporal surgical site with healthy granulating tissue and hyperkeratosis area on  right bridge of nose.  EYES: normal, Conjunctiva are pink and non-injected  EARS: External ears normal  OROPHARYNX:mucous membranes are moist  NECK: supple, no adenopathy, thyroid normal size, non-tender, without nodularity, no stridor, non-tender, trachea midline  LYMPH: no palpable lymphadenopathy  BREAST:not examined  LUNGS: clear to auscultation and percussion  HEART: regular rate & rhythm, no murmurs, no gallops, S1 normal and S2 normal  ABDOMEN:abdomen soft, non-tender and normal bowel sounds  BACK: Back symmetric, no curvature.  EXTREMITIES:less then 2 second capillary refill, no joint deformities, effusion, or inflammation, no edema, no skin discoloration, no clubbing, no cyanosis  NEURO: alert & oriented x 3 with fluent speech, no focal motor/sensory deficits, gait normal with the assistance of a cane for stability   LABORATORY DATA: CBC    Component Value Date/Time   WBC 7.1 11/26/2012 1100   RBC 2.97* 11/26/2012 1100   HGB 9.7* 11/26/2012 1100   HCT 29.4* 11/26/2012 1100   PLT 114* 11/26/2012 1100   MCV 99.0 11/26/2012 1100   MCH 32.7 11/26/2012 1100   MCHC 33.0 11/26/2012 1100   RDW 15.3 11/26/2012 1100   LYMPHSABS 2.5 11/26/2012 1100   MONOABS 1.0 11/26/2012 1100   EOSABS 0.5 11/26/2012 1100   BASOSABS 0.1 11/26/2012 1100      Chemistry      Component Value Date/Time   NA 137 11/26/2012 1100   K 4.5 11/26/2012 1100   CL 101 11/26/2012 1100   CO2 27 11/26/2012 1100   BUN 22 11/26/2012 1100   CREATININE 1.56* 11/26/2012 1100   CREATININE 1.35 03/07/2012 1210      Component Value Date/Time   CALCIUM 9.4 11/26/2012 1100   ALKPHOS 169* 11/26/2012 1100   AST 11 11/26/2012 1100   ALT 8 11/26/2012 1100   BILITOT 0.4 11/26/2012 1100      PATHOLOGY:  02/19/2012  Diagnosis  Bone Marrow, Aspirate,Biopsy, and Clot, left iliac crest  - HYPERCELLULAR BONE MARROW FOR AGE WITH DYSPOIETIC CHANGES.  - SEE COMMENT.  PERIPHERAL BLOOD:  - MACROCYTIC ANEMIA  - THROMBOCYTOPENIA.  Diagnosis Note  The bone marrow is  generally hypercellular for age with variable dyspoietic changes of myeloid cell lines.  Megakaryocytes in particular show numerous hypolobated/unilobated forms. No morphologic increase in  blastic cells is identified. The overall morphologic features are worrisome for a clonal myelodysplastic state,  particularly refractory cytopenia with multilineage dysplasia. Correlation with cytogenetic studies is strongly  recommended. (BNS:kh 02-20-12)  Guerry Bruin MD  Pathologist, Electronic Signature  (Case signed 02/20/2012)    ASSESSMENT:  1. MDS with 5 q- syndrome. Initially treated with Lee Ramirez and then he developed Lee Ramirez-induced urticaria. This was discontinued. No with recent worsening hemoglobin. On thalidomide 100 mg daily with intolerance on dose escalation to 200 mg.   2. Lee Ramirez-induced urticaria requiring D/C of this treatment.  3. Chronic fatigue. Multifactorial.  4. Right anterior temporal excision biopsy performed by dermatologist, pathology pending.  Procedure performed on 12/18/12.   PLAN:  1. I personally reviewed and went over laboratory results with the patient. 2. Continue Thalidomide 100 mg daily.  3. Discussion regarding Aranesp therapy.  Discussed risks, benefits, alternatives, and side effects of therapy.  At this time, we have concluded to wait on  this therapy and initiate in the future if needed.  4. Labs today: CBC diff, CMET 5. Labs in 3 weeks and 6 weeks: CBC diff, CMET 6. Return in 6 weeks for follow-up at patient's request.  All questions were answered. The patient knows to call the clinic with any problems, questions or concerns. We can certainly see the patient much sooner if necessary.  The patient and plan discussed with Glenford Peers, MD and he is in agreement with the aforementioned.  Elisabeth Strom

## 2012-12-19 NOTE — Progress Notes (Signed)
Lee Ramirez presented for labwork. Labs per MD order drawn via Peripheral Line 25 gauge needle inserted in rt ac.  Good blood return present. Procedure without incident.  Needle removed intact. Patient tolerated procedure well.

## 2013-01-09 ENCOUNTER — Other Ambulatory Visit (HOSPITAL_COMMUNITY): Payer: Medicare Other

## 2013-01-09 ENCOUNTER — Other Ambulatory Visit (HOSPITAL_COMMUNITY): Payer: Self-pay | Admitting: Oncology

## 2013-01-09 DIAGNOSIS — D469 Myelodysplastic syndrome, unspecified: Secondary | ICD-10-CM

## 2013-01-09 MED ORDER — THALIDOMIDE 100 MG PO CAPS: 100.0000 mg | ORAL_CAPSULE | Freq: Every day | ORAL | Status: DC

## 2013-01-13 ENCOUNTER — Other Ambulatory Visit: Payer: Self-pay

## 2013-01-13 MED ORDER — NITROGLYCERIN 0.4 MG SL SUBL: 0.4000 mg | SUBLINGUAL_TABLET | SUBLINGUAL | Status: AC | PRN

## 2013-01-15 ENCOUNTER — Telehealth (HOSPITAL_COMMUNITY): Payer: Self-pay | Admitting: *Deleted

## 2013-01-15 NOTE — Telephone Encounter (Signed)
Patient called complaining of extreme weakness. He missed his appt last week for labs. He did not want to come today so I scheduled him for tomorrow am

## 2013-01-16 ENCOUNTER — Other Ambulatory Visit (HOSPITAL_COMMUNITY): Payer: Medicare Other

## 2013-01-21 ENCOUNTER — Encounter (HOSPITAL_COMMUNITY): Payer: Medicare Other | Attending: Oncology

## 2013-01-21 DIAGNOSIS — D469 Myelodysplastic syndrome, unspecified: Secondary | ICD-10-CM | POA: Insufficient documentation

## 2013-01-21 LAB — CBC WITH DIFFERENTIAL/PLATELET
Eosinophils Absolute: 0.4 10*3/uL (ref 0.0–0.7)
Hemoglobin: 8.5 g/dL — ABNORMAL LOW (ref 13.0–17.0)
Lymphocytes Relative: 34 % (ref 12–46)
Lymphs Abs: 1.8 10*3/uL (ref 0.7–4.0)
Neutro Abs: 2.5 10*3/uL (ref 1.7–7.7)
Neutrophils Relative %: 46 % (ref 43–77)
Platelets: 114 10*3/uL — ABNORMAL LOW (ref 150–400)
RBC: 2.58 MIL/uL — ABNORMAL LOW (ref 4.22–5.81)
WBC: 5.4 10*3/uL (ref 4.0–10.5)

## 2013-01-21 LAB — COMPREHENSIVE METABOLIC PANEL
ALT: 25 U/L (ref 0–53)
AST: 15 U/L (ref 0–37)
CO2: 27 mEq/L (ref 19–32)
Calcium: 9.2 mg/dL (ref 8.4–10.5)
GFR calc non Af Amer: 49 mL/min — ABNORMAL LOW (ref >90)
Sodium: 139 mEq/L (ref 135–145)
Total Protein: 7 g/dL (ref 6.0–8.3)

## 2013-01-21 NOTE — Addendum Note (Signed)
Addended byLeida Lauth on: 01/21/2013 12:42 PM   Modules accepted: Orders

## 2013-01-21 NOTE — Addendum Note (Signed)
Addended by: Edythe Lynn A on: 01/21/2013 12:41 PM   Modules accepted: Orders, SmartSet

## 2013-01-21 NOTE — Progress Notes (Signed)
Labs drawn today for cbc/diff,cmp 

## 2013-01-22 ENCOUNTER — Encounter (HOSPITAL_BASED_OUTPATIENT_CLINIC_OR_DEPARTMENT_OTHER): Payer: Medicare Other

## 2013-01-22 VITALS — BP 128/67 | HR 57 | Temp 97.8°F | Resp 18

## 2013-01-22 DIAGNOSIS — D469 Myelodysplastic syndrome, unspecified: Secondary | ICD-10-CM

## 2013-01-22 MED ORDER — SODIUM CHLORIDE 0.9 % IJ SOLN
10.0000 mL | INTRAMUSCULAR | Status: DC | PRN
  Filled 2013-01-22: qty 10

## 2013-01-22 MED ORDER — SODIUM CHLORIDE 0.9 % IV SOLN
250.0000 mL | Freq: Once | INTRAVENOUS | Status: AC
  Administered 2013-01-22: 250 mL via INTRAVENOUS

## 2013-01-22 NOTE — Progress Notes (Signed)
Tolerated well

## 2013-01-23 LAB — TYPE AND SCREEN
Antibody Screen: NEGATIVE
Unit division: 0

## 2013-01-28 ENCOUNTER — Other Ambulatory Visit: Payer: Self-pay | Admitting: *Deleted

## 2013-01-28 MED ORDER — PRAVASTATIN SODIUM 40 MG PO TABS: 40.0000 mg | ORAL_TABLET | Freq: Every day | ORAL | Status: DC

## 2013-01-28 NOTE — Progress Notes (Signed)
Medication sent via escribe.  

## 2013-01-30 ENCOUNTER — Other Ambulatory Visit (HOSPITAL_COMMUNITY): Payer: Medicare Other

## 2013-02-09 ENCOUNTER — Other Ambulatory Visit (HOSPITAL_COMMUNITY): Payer: Self-pay | Admitting: Oncology

## 2013-02-09 DIAGNOSIS — D469 Myelodysplastic syndrome, unspecified: Secondary | ICD-10-CM

## 2013-02-09 MED ORDER — THALIDOMIDE 100 MG PO CAPS: 100.0000 mg | ORAL_CAPSULE | Freq: Every day | ORAL | Status: DC

## 2013-02-17 ENCOUNTER — Ambulatory Visit (HOSPITAL_COMMUNITY): Payer: Medicare Other

## 2013-02-19 ENCOUNTER — Encounter (HOSPITAL_COMMUNITY): Payer: Self-pay

## 2013-02-19 ENCOUNTER — Other Ambulatory Visit (HOSPITAL_COMMUNITY): Payer: Self-pay | Admitting: Oncology

## 2013-02-19 ENCOUNTER — Encounter (HOSPITAL_COMMUNITY): Payer: Medicare Other | Attending: Internal Medicine

## 2013-02-19 VITALS — Temp 97.0°F | Resp 18 | Wt 173.9 lb

## 2013-02-19 DIAGNOSIS — D469 Myelodysplastic syndrome, unspecified: Secondary | ICD-10-CM | POA: Insufficient documentation

## 2013-02-19 DIAGNOSIS — D6959 Other secondary thrombocytopenia: Secondary | ICD-10-CM | POA: Insufficient documentation

## 2013-02-19 DIAGNOSIS — D539 Nutritional anemia, unspecified: Secondary | ICD-10-CM | POA: Insufficient documentation

## 2013-02-19 DIAGNOSIS — R748 Abnormal levels of other serum enzymes: Secondary | ICD-10-CM

## 2013-02-19 LAB — CBC WITH DIFFERENTIAL/PLATELET
Basophils Absolute: 0.2 10*3/uL — ABNORMAL HIGH (ref 0.0–0.1)
Eosinophils Absolute: 0.5 10*3/uL (ref 0.0–0.7)
Eosinophils Relative: 8 % — ABNORMAL HIGH (ref 0–5)
MCH: 32.9 pg (ref 26.0–34.0)
MCV: 97.2 fL (ref 78.0–100.0)
Monocytes Absolute: 0.7 10*3/uL (ref 0.1–1.0)
Platelets: 97 10*3/uL — ABNORMAL LOW (ref 150–400)
RDW: 16.7 % — ABNORMAL HIGH (ref 11.5–15.5)
Smear Review: DECREASED

## 2013-02-19 LAB — COMPREHENSIVE METABOLIC PANEL
AST: 10 U/L (ref 0–37)
Albumin: 3 g/dL — ABNORMAL LOW (ref 3.5–5.2)
BUN: 23 mg/dL (ref 6–23)
CO2: 25 mEq/L (ref 19–32)
Calcium: 9.1 mg/dL (ref 8.4–10.5)
Creatinine, Ser: 1.41 mg/dL — ABNORMAL HIGH (ref 0.50–1.35)
GFR calc non Af Amer: 43 mL/min — ABNORMAL LOW (ref >90)

## 2013-02-19 NOTE — Patient Instructions (Addendum)
[facility] Discharge Instructions  RECOMMENDATIONS MADE BY THE CONSULTANT AND ANY TEST RESULTS WILL BE SENT TO YOUR REFERRING PHYSICIAN.  EXAM FINDINGS BY THE PHYSICIAN TODAY AND SIGNS OR SYMPTOMS TO REPORT TO CLINIC OR PRIMARY PHYSICIAN: Exam and discussion by Dr. Sherrlyn Hock.  We will check some blood work today and if you need a transfusion we will have to get you back on Monday to do your type and cross match for transfusion on Tuesday.  MEDICATIONS PRESCRIBED:  Continue Thalidomide   INSTRUCTIONS GIVEN AND DISCUSSED: Report fevers, infections, increased shortness of breath or other problems.  SPECIAL INSTRUCTIONS/FOLLOW-UP: Blood work in 2 weeks and 4 week and follow-up after labs in 4 weeks.  Thank you for choosing [facility] to provide your oncology and hematology care.  To afford each patient quality time with our providers, please arrive at least 15 minutes before your scheduled appointment time.  With your help, our goal is to use those 15 minutes to complete the necessary work-up to ensure our physicians have the information they need to help with your evaluation and healthcare recommendations.    Effective January 1st, 2014, we ask that you re-schedule your appointment with our physicians should you arrive 10 or more minutes late for your appointment.  We strive to give you quality time with our providers, and arriving late affects you and other patients whose appointments are after yours.    Again, thank you for choosing [facility].  Our hope is that these requests will decrease the amount of time that you wait before being seen by our physicians.       _____________________________________________________________  Should you have questions after your visit to [facility], please contact our office at [phone] between the hours of 8:30 a.m. and 5:00 p.m.  Voicemails left after 4:30 p.m. will not be returned until the  following business day.  For prescription refill requests, have your pharmacy contact our office with your prescription refill request.

## 2013-02-19 NOTE — Progress Notes (Signed)
[name], MD [address] [place] [address]  MDS (myelodysplastic syndrome) with 5q- syndrome - Plan: Bisacodyl (LAXATIVE PO), CBC with Differential, Comprehensive metabolic panel, Iron and TIBC, Ferritin, Vitamin B12, Folate, CBC with Differential, Comprehensive metabolic panel, Iron and TIBC, Ferritin, Vitamin B12, Folate, CBC with Differential, Basic metabolic panel, CBC with Differential  CURRENT THERAPY:Thalidomide 100 mg daily  INTERVAL HISTORY: Lee Ramirez 79 y.o. 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 He developed Revlimid-induced urticaria starting on 07/19/2012 and the Revlimid was stopped on 07/23/2012, patient states the rash subsided completely. Patient was then tried on thalidomide 200 mg daily but had difficulty tolerating this dose, he is currently on thalidomide 100 mg daily which he feels is tolerating fairly well. States that he continues to have issues with chronic fatigue on exertion/activity, has mild dyspnea on exertion also. Most recent Hb on June 4 was 8.5 g/dL. He also has to care for his wife who he reports has dementia issues. Otherwise he denies any dyspnea at rest, orthopnea or PND. Denies any obvious bleeding symptoms including bright red blood per rectum, melena or hematuria. He has chronic arthritis, denies new bone pains. He has history of carotid artery disease, states that he follows with his cardiologist, Dr. Dietrich Pates who was pleased from a cardiac standpoint.  Hematologically, other ROS questioning is negative.     Past Medical History  Diagnosis Date  . ASCVD (arteriosclerotic cardiovascular disease)     Sizable non-Q. myocardial infarction in 09/2009; severe three-vessel disease with an ejection fraction of 35%; three-vessel DES complicated by thrombocytopenia and mild renal insufficiency  . Hyperlipidemia     09/2010: TC-151, TG-411, H.-32,  L.-not calculated   . Anemia     H/H -12.3/37.2; MCV-103; platelets-114 (08/2010)   . 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 Hypertension   . Orthostatic hypotension     Lightheaded; no syncope  . Thrombocytopenia, acquired     09/2010-114,000  . Hepatic steatosis 2013  . Thrombocytopenia     referred to hem-onc 5/13  . Squamous cell skin cancer 10/2012    of nose    has Arteriosclerotic cardiovascular disease (ASCVD)-ischemic cardiomyopathy; Thrombocytopenia, acquired; Hyperlipidemia; Hypertension; Orthostatic hypotension; Anemia, macrocytic; Insomnia; GERD (gastroesophageal reflux disease); and MDS (myelodysplastic syndrome) with 5q- syndrome on his problem list.     is allergic to aspirin and revlimid.  Lee Ramirez does not currently have medications on file.  Past Surgical History  Procedure Laterality Date  . Inguinal hernia repair      Right  . Appendectomy    . Coronary angioplasty with stent placement    . Bone marrow biopsy  02/19/12  . Bone marrow aspiration  02/19/12  . Skin cancer excision  10/2012    ROS: As in history of present illness above. In addition denies fevers, chills or night sweats. Denies any headaches or focal weakness. Denies nausea, vomiting, diarrhea, constipation, chest pain, heart palpitations, blood in stool, black tarry stool, urinary pain, urinary burning, urinary frequency, hematuria, falls or LOC.   PHYSICAL EXAMINATION  ECOG PERFORMANCE STATUS: 2 - Symptomatic, <50% confined to bed  Filed Vitals:   02/19/13 1040  Temp: 97 F (36.1 C)  Resp: 18    GENERAL: patient is elderly, alert and oriented, no acute distress. No icterus. Pallor present  HEENT: EOMs intact. No cervical adenopathy. CVS :  S1-S2, regular  LUNGS: clear to auscultation, no rhonchi  ABDOMEN: soft, non-tender  EXTREMITIES: No pedal edema or cyanosis.   NEURO: alert & oriented x 3 with fluent speech, nonfocal, gait normal with  the assistance of a cane for stability   LABORATORY DATA: CBC    Component Value Date/Time   WBC 5.8 02/19/2013 1109   RBC 2.86* 02/19/2013 1109   HGB 9.4* 02/19/2013 1109   HCT 27.8* 02/19/2013 1109   PLT 97* 02/19/2013 1109   MCV 97.2 02/19/2013 1109   MCH 32.9 02/19/2013 1109   MCHC 33.8 02/19/2013 1109   RDW 16.7* 02/19/2013 1109   LYMPHSABS 1.9 02/19/2013 1109   MONOABS 0.7 02/19/2013 1109   EOSABS 0.5 02/19/2013 1109   BASOSABS 0.2* 02/19/2013 1109      Chemistry      Component Value Date/Time   NA 139 01/21/2013 1107   K 4.3 01/21/2013 1107   CL 104 01/21/2013 1107   CO2 27 01/21/2013 1107   BUN 23 01/21/2013 1107   CREATININE 1.26 01/21/2013 1107   CREATININE 1.35 03/07/2012 1210      Component Value Date/Time   CALCIUM 9.2 01/21/2013 1107   ALKPHOS 198* 01/21/2013 1107   AST 15 01/21/2013 1107   ALT 25 01/21/2013 1107   BILITOT 0.2* 01/21/2013 1107      PATHOLOGY:  02/19/2012  Diagnosis  Bone Marrow, Aspirate,Biopsy, and Clot, left iliac crest  - HYPERCELLULAR BONE MARROW FOR AGE WITH DYSPOIETIC CHANGES.  - SEE COMMENT.  PERIPHERAL BLOOD:  - MACROCYTIC ANEMIA  - THROMBOCYTOPENIA.  Diagnosis Note  The bone marrow is generally hypercellular for age with variable dyspoietic changes of myeloid cell lines.  Megakaryocytes in particular show numerous hypolobated/unilobated forms. No morphologic increase in  blastic cells is identified. The overall morphologic features are worrisome for a clonal myelodysplastic state,  particularly refractory cytopenia with multilineage dysplasia. Correlation with cytogenetic studies is strongly  recommended. (BNS:kh 02-20-12)  Guerry Bruin MD  Pathologist, Electronic Signature  (Case signed 02/20/2012)    ASSESSMENT/PLAN:  1. MDS with 5 q- syndrome. Initially treated with Revlimid and then he developed Revlimid-induced urticaria. This was discontinued. No with recent worsening hemoglobin. On thalidomide 100 mg daily with intolerance on dose escalation to 200 mg.   2.  Revlimid-induced urticaria requiring D/C of this treatment.  3. Chronic fatigue. Multifactorial.  Patient overall seems to be doing fairly steady without progressive symptoms of anemia. He is tolerating low-dose thalidomide 100 mg daily fairly well without major side effects including no new neurological symptoms. Fatiguee has been a chronic issue and seems mostly unchanged. Repeat CBC today shows that hemoglobin is better at 9.4 g, does not need any transfusion support today. Have also discussed option of pursuing ESA therapy like Aranesp or Procrit and discussed benefits and risks, patient prefers to hold off at this time, he does have history of coronary artery disease but seems to be doing well without any ongoing issues. WBC count is unremarkable, platelet count is steady and he has no obvious bleeding symptoms. Plan at this time is to continue on current low-dose thalidomide treatment. Will monitor CBC at 2 weeks, next MD followup at 4 weeks with repeat labs and make continued treatment planning. In between visits, patient was advised to call or come to ER in case of any progressive anemia symptoms, bleeding issues or acute sickness. He is agreeable to this plan.   Lee Ramirez

## 2013-02-20 LAB — VITAMIN B12: Vitamin B-12: 314 pg/mL (ref 211–911)

## 2013-02-20 LAB — IRON AND TIBC
Iron: 64 ug/dL (ref 42–135)
Saturation Ratios: 26 % (ref 20–55)
TIBC: 242 ug/dL (ref 215–435)

## 2013-02-26 ENCOUNTER — Ambulatory Visit (HOSPITAL_COMMUNITY): Payer: Medicare Other

## 2013-02-27 ENCOUNTER — Encounter (HOSPITAL_COMMUNITY): Payer: Self-pay | Admitting: Emergency Medicine

## 2013-02-27 ENCOUNTER — Observation Stay (HOSPITAL_COMMUNITY)
Admission: EM | Admit: 2013-02-27 | Discharge: 2013-02-28 | Disposition: A | Discharge status: Home / Self Care | Payer: Medicare Other | Attending: Internal Medicine | Admitting: Internal Medicine

## 2013-02-27 ENCOUNTER — Emergency Department (HOSPITAL_COMMUNITY): Payer: Medicare Other

## 2013-02-27 DIAGNOSIS — I2589 Other forms of chronic ischemic heart disease: Secondary | ICD-10-CM | POA: Insufficient documentation

## 2013-02-27 DIAGNOSIS — D469 Myelodysplastic syndrome, unspecified: Secondary | ICD-10-CM | POA: Diagnosis present

## 2013-02-27 DIAGNOSIS — E785 Hyperlipidemia, unspecified: Secondary | ICD-10-CM | POA: Diagnosis present

## 2013-02-27 DIAGNOSIS — G4700 Insomnia, unspecified: Secondary | ICD-10-CM | POA: Diagnosis present

## 2013-02-27 DIAGNOSIS — D6959 Other secondary thrombocytopenia: Secondary | ICD-10-CM

## 2013-02-27 DIAGNOSIS — I129 Hypertensive chronic kidney disease with stage 1 through stage 4 chronic kidney disease, or unspecified chronic kidney disease: Secondary | ICD-10-CM | POA: Insufficient documentation

## 2013-02-27 DIAGNOSIS — D539 Nutritional anemia, unspecified: Secondary | ICD-10-CM

## 2013-02-27 DIAGNOSIS — I10 Essential (primary) hypertension: Secondary | ICD-10-CM | POA: Diagnosis present

## 2013-02-27 DIAGNOSIS — D696 Thrombocytopenia, unspecified: Secondary | ICD-10-CM | POA: Diagnosis present

## 2013-02-27 DIAGNOSIS — C61 Malignant neoplasm of prostate: Secondary | ICD-10-CM | POA: Insufficient documentation

## 2013-02-27 DIAGNOSIS — K219 Gastro-esophageal reflux disease without esophagitis: Secondary | ICD-10-CM | POA: Diagnosis present

## 2013-02-27 DIAGNOSIS — I951 Orthostatic hypotension: Secondary | ICD-10-CM

## 2013-02-27 DIAGNOSIS — D649 Anemia, unspecified: Principal | ICD-10-CM

## 2013-02-27 DIAGNOSIS — R531 Weakness: Secondary | ICD-10-CM | POA: Diagnosis present

## 2013-02-27 DIAGNOSIS — N189 Chronic kidney disease, unspecified: Secondary | ICD-10-CM | POA: Insufficient documentation

## 2013-02-27 DIAGNOSIS — Z66 Do not resuscitate: Secondary | ICD-10-CM | POA: Insufficient documentation

## 2013-02-27 DIAGNOSIS — I2510 Atherosclerotic heart disease of native coronary artery without angina pectoris: Secondary | ICD-10-CM | POA: Diagnosis present

## 2013-02-27 DIAGNOSIS — I429 Cardiomyopathy, unspecified: Secondary | ICD-10-CM | POA: Diagnosis present

## 2013-02-27 DIAGNOSIS — N179 Acute kidney failure, unspecified: Secondary | ICD-10-CM | POA: Diagnosis present

## 2013-02-27 DIAGNOSIS — Z79899 Other long term (current) drug therapy: Secondary | ICD-10-CM | POA: Insufficient documentation

## 2013-02-27 HISTORY — DX: Myelodysplastic syndrome, unspecified: D46.9

## 2013-02-27 LAB — URINALYSIS, ROUTINE W REFLEX MICROSCOPIC
Leukocytes, UA: NEGATIVE
Nitrite: NEGATIVE
Urobilinogen, UA: 0.2 mg/dL (ref 0.0–1.0)
pH: 5.5 (ref 5.0–8.0)

## 2013-02-27 LAB — COMPREHENSIVE METABOLIC PANEL
ALT: 17 U/L (ref 0–53)
Alkaline Phosphatase: 280 U/L — ABNORMAL HIGH (ref 39–117)
BUN: 35 mg/dL — ABNORMAL HIGH (ref 6–23)
CO2: 26 mEq/L (ref 19–32)
Calcium: 8.8 mg/dL (ref 8.4–10.5)
GFR calc Af Amer: 37 mL/min — ABNORMAL LOW (ref >90)
GFR calc non Af Amer: 32 mL/min — ABNORMAL LOW (ref >90)
Glucose, Bld: 154 mg/dL — ABNORMAL HIGH (ref 70–99)
Sodium: 136 mEq/L (ref 135–145)
Total Protein: 6.6 g/dL (ref 6.0–8.3)

## 2013-02-27 LAB — TROPONIN I: Troponin I: <0.3 ng/mL (ref <0.30)

## 2013-02-27 LAB — CBC WITH DIFFERENTIAL/PLATELET
Basophils Relative: 1 % (ref 0–1)
Eosinophils Relative: 3 % (ref 0–5)
HCT: 23.2 % — ABNORMAL LOW (ref 39.0–52.0)
Hemoglobin: 7.7 g/dL — ABNORMAL LOW (ref 13.0–17.0)
Lymphs Abs: 1.5 10*3/uL (ref 0.7–4.0)
MCH: 33 pg (ref 26.0–34.0)
MCHC: 33.2 g/dL (ref 30.0–36.0)
MCV: 99.6 fL (ref 78.0–100.0)
Monocytes Absolute: 0.8 10*3/uL (ref 0.1–1.0)
RBC: 2.33 MIL/uL — ABNORMAL LOW (ref 4.22–5.81)
Smear Review: DECREASED

## 2013-02-27 MED ORDER — ALBUTEROL SULFATE HFA 108 (90 BASE) MCG/ACT IN AERS: 2.0000 | INHALATION_SPRAY | Freq: Four times a day (QID) | RESPIRATORY_TRACT | Status: DC | PRN

## 2013-02-27 MED ORDER — SODIUM CHLORIDE 0.9 % IV BOLUS (SEPSIS): 500.0000 mL | Freq: Once | INTRAVENOUS | Status: AC

## 2013-02-27 MED ORDER — ZOLPIDEM TARTRATE 5 MG PO TABS: 5.0000 mg | ORAL_TABLET | Freq: Every evening | ORAL | Status: DC | PRN

## 2013-02-27 MED ORDER — PANTOPRAZOLE SODIUM 40 MG PO TBEC
40.0000 mg | DELAYED_RELEASE_TABLET | Freq: Every day | ORAL | Status: DC
  Administered 2013-02-27 – 2013-02-28 (×2): 40 mg via ORAL
  Filled 2013-02-27 (×2): qty 1

## 2013-02-27 MED ORDER — SODIUM CHLORIDE 0.9 % IJ SOLN: 3.0000 mL | INTRAMUSCULAR | Status: DC | PRN

## 2013-02-27 MED ORDER — BISACODYL 5 MG PO TBEC
5.0000 mg | DELAYED_RELEASE_TABLET | Freq: Every day | ORAL | Status: DC | PRN
  Administered 2013-02-27: 5 mg via ORAL
  Filled 2013-02-27: qty 1

## 2013-02-27 MED ORDER — ALLOPURINOL 300 MG PO TABS
150.0000 mg | ORAL_TABLET | Freq: Every day | ORAL | Status: DC
  Administered 2013-02-27: 150 mg via ORAL
  Filled 2013-02-27 (×2): qty 1

## 2013-02-27 MED ORDER — BISACODYL 10 MG RE SUPP: 10.0000 mg | Freq: Every day | RECTAL | Status: DC | PRN

## 2013-02-27 MED ORDER — ALLOPURINOL 300 MG PO TABS
150.0000 mg | ORAL_TABLET | Freq: Every day | ORAL | Status: DC
Start: 2013-02-27 — End: 2013-02-27
  Filled 2013-02-27 (×4): qty 1

## 2013-02-27 MED ORDER — ONDANSETRON HCL 4 MG PO TABS: 4.0000 mg | ORAL_TABLET | Freq: Four times a day (QID) | ORAL | Status: DC | PRN

## 2013-02-27 MED ORDER — ONDANSETRON HCL 4 MG/2ML IJ SOLN: 4.0000 mg | Freq: Four times a day (QID) | INTRAMUSCULAR | Status: DC | PRN

## 2013-02-27 MED ORDER — NITROGLYCERIN 0.4 MG SL SUBL: 0.4000 mg | SUBLINGUAL_TABLET | SUBLINGUAL | Status: DC | PRN

## 2013-02-27 MED ORDER — SODIUM CHLORIDE 0.9 % IV SOLN: 250.0000 mL | INTRAVENOUS | Status: DC | PRN

## 2013-02-27 MED ORDER — ACETAMINOPHEN 500 MG PO TABS: 1000.0000 mg | ORAL_TABLET | Freq: Four times a day (QID) | ORAL | Status: DC | PRN

## 2013-02-27 MED ORDER — CARVEDILOL 3.125 MG PO TABS
6.2500 mg | ORAL_TABLET | Freq: Two times a day (BID) | ORAL | Status: DC
  Administered 2013-02-28: 6.25 mg via ORAL
  Filled 2013-02-27: qty 2

## 2013-02-27 MED ORDER — SODIUM CHLORIDE 0.9 % IJ SOLN: 3.0000 mL | Freq: Two times a day (BID) | INTRAMUSCULAR | Status: DC

## 2013-02-27 MED ORDER — TRAVOPROST (BAK FREE) 0.004 % OP SOLN
1.0000 [drp] | Freq: Every day | OPHTHALMIC | Status: DC
  Administered 2013-02-27: 1 [drp] via OPHTHALMIC
  Filled 2013-02-27: qty 2.5

## 2013-02-27 MED ORDER — ALUM & MAG HYDROXIDE-SIMETH 200-200-20 MG/5ML PO SUSP: 30.0000 mL | Freq: Four times a day (QID) | ORAL | Status: DC | PRN

## 2013-02-27 NOTE — ED Notes (Signed)
Pt needs radiated blood transfusion which comes from [facility]. EDP/pt/family aware of wait. nad at this time.

## 2013-02-27 NOTE — ED Notes (Signed)
Pt blood pressure in right arm is 88/28, rechecked pt blood pressure in left arm it is 89/31. RN is aware.

## 2013-02-27 NOTE — ED Provider Notes (Signed)
History    This chart was scribed for Lee Lennert, MD, MD by Ashley Jacobs, ED Scribe and Bennett Scrape, ED Scribe. The patient was seen in room APA04/APA04 and the patient's care was started at 10:31 AM.  CSN: [id number] Arrival date & time 02/27/13  1016  First MD Initiated Contact with Patient 02/27/13 1031     Chief Complaint  Patient presents with  . Weakness    Patient is a 79 y.o. male presenting with general illness. The history is provided by the patient and medical records. No language interpreter was used.  Illness Location:  Diffuse Quality:  Weak Severity:  Moderate Onset quality:  Gradual Duration:  2 weeks Timing:  Constant Progression:  Worsening Chronicity:  Chronic Associated symptoms: no abdominal pain, no chest pain, no congestion, no cough, no diarrhea, no fatigue, no fever, no headaches and no rash    HPI Comments: Lee Ramirez is a 79 y.o. male who presents to the Emergency Department complaining of worsening of chronic weakness for the past two weeks PTA. At baseline, the pt has difficulty ambulating but since the symptoms have worsened, he has been unable to stand without assistance which is new. Pt is currently taking Thalomid as radiation treatment for prostate CA. Pt reports having a hx of anemia and was recently given a blood transfusion in June 2014. Family member present states that the symptoms are the same as during that episode.  Pt denies fever, pain, and cough.  Past Medical History  Diagnosis Date  . ASCVD (arteriosclerotic cardiovascular disease)     Sizable non-Q. myocardial infarction in 09/2009; severe three-vessel disease with an ejection fraction of 35%; three-vessel DES complicated by thrombocytopenia and mild renal insufficiency  . Hyperlipidemia     09/2010: TC-151, TG-411, H.-32, L.-not calculated   . Anemia     H/H -12.3/37.2; MCV-103; platelets-114 (08/2010)   . Malignant neoplasm of prostate     s/p radiation therapy  complicated by proctitis  . Hypertension   . Orthostatic hypotension     Lightheaded; no syncope  . Thrombocytopenia, acquired     09/2010-114,000  . Hepatic steatosis 2013  . Thrombocytopenia     referred to hem-onc 5/13  . Squamous cell skin cancer 10/2012    of nose  . Myelodysplastic syndrome   . Myelodysplastic syndrome    Past Surgical History  Procedure Laterality Date  . Inguinal hernia repair      Right  . Appendectomy    . Coronary angioplasty with stent placement    . Bone marrow biopsy  02/19/12  . Bone marrow aspiration  02/19/12  . Skin cancer excision  10/2012   History reviewed. No pertinent family history. History  Substance Use Topics  . Smoking status: Never Smoker   . Smokeless tobacco: Never Used  . Alcohol Use: No    Review of Systems  Constitutional: Negative for fever, appetite change and fatigue.  HENT: Negative for congestion, sinus pressure and ear discharge.   Eyes: Negative for discharge.  Respiratory: Negative for cough.   Cardiovascular: Negative for chest pain.  Gastrointestinal: Negative for abdominal pain and diarrhea.  Genitourinary: Negative for frequency and hematuria.  Musculoskeletal: Negative for back pain.  Skin: Negative for rash.  Neurological: Positive for weakness. Negative for seizures and headaches.  Psychiatric/Behavioral: Negative for hallucinations.    Allergies  Aspirin and Revlimid  Home Medications   Current Outpatient Rx  Name  Route  Sig  Dispense  Refill  .  acetaminophen (TYLENOL) 500 MG tablet   Oral   Take 1,000 mg by mouth every 6 (six) hours as needed. Pain         . albuterol (PROAIR HFA) 108 (90 BASE) MCG/ACT inhaler   Inhalation   Inhale 2 puffs into the lungs every 6 (six) hours as needed. For shortness of breath         . allopurinol (ZYLOPRIM) 300 MG tablet   Oral   Take 150 mg by mouth daily.          . Bisacodyl (LAXATIVE PO)   Oral   Take by mouth as needed.         . carvedilol  (COREG) 12.5 MG tablet   Oral   Take 0.5 tablets (6.25 mg total) by mouth 2 (two) times daily with a meal.   30 tablet   12   . CRANBERRY PO   Oral   Take 1 capsule by mouth daily.         . furosemide (LASIX) 20 MG tablet   Oral   Take 0.5 tablets (10 mg total) by mouth daily.   30 tablet   12   . lisinopril (PRINIVIL,ZESTRIL) 2.5 MG tablet   Oral   Take 1 tablet (2.5 mg total) by mouth daily.   30 tablet   12   . meclizine (ANTIVERT) 12.5 MG tablet   Oral   Take 12.5 mg by mouth 3 (three) times daily as needed. Dizziness         . Multiple Vitamins-Iron (DAILY-VITAMIN/IRON PO)   Oral   Take 1 tablet by mouth daily.         . nitroGLYCERIN (NITROSTAT) 0.4 MG SL tablet   Sublingual   Place 1 tablet (0.4 mg total) under the tongue every 5 (five) minutes as needed.   25 tablet   3   . omeprazole (PRILOSEC) 20 MG capsule   Oral   Take 20 mg by mouth daily.         . ondansetron (ZOFRAN) 4 MG tablet   Oral   Take 4 mg by mouth every 6 (six) hours.         . pravastatin (PRAVACHOL) 40 MG tablet   Oral   Take 1 tablet (40 mg total) by mouth daily.   30 tablet   3   . thalidomide (THALOMID) 100 MG capsule   Oral   Take 1 capsule (100 mg total) by mouth daily. Take with water.   28 capsule   0   . Travoprost, BAK Free, (TRAVATAN) 0.004 % SOLN ophthalmic solution   Both Eyes   Place 1 drop into both eyes at bedtime.          Marland Kitchen zolpidem (AMBIEN) 10 MG tablet   Oral   Take 5 mg by mouth at bedtime as needed. For sleep          BP 89/31  Pulse 64  Temp(Src) 97.5 F (36.4 C) (Oral)  Resp 20  Ht 6\' 2"  (1.88 m)  Wt 170 lb (77.111 kg)  BMI 21.82 kg/m2  SpO2 97% Physical Exam  Nursing note and vitals reviewed. Constitutional: He is oriented to person, place, and time. He appears well-developed and well-nourished.  HENT:  Head: Normocephalic and atraumatic.  Dry mucous membrane  Eyes: Conjunctivae and EOM are normal. No scleral icterus.   Neck: Neck supple. No thyromegaly present.  Cardiovascular: Normal rate and regular rhythm.  Exam reveals no gallop and no  friction rub.   No murmur heard. Pulmonary/Chest: Effort normal and breath sounds normal. No stridor. He has no wheezes. He has no rales. He exhibits no tenderness.  Abdominal: He exhibits no distension. There is no tenderness. There is no rebound.  Musculoskeletal: Normal range of motion. He exhibits no edema.  Lymphadenopathy:    He has no cervical adenopathy.  Neurological: He is alert and oriented to person, place, and time. Coordination normal.  profound weakness throughout  Skin: Skin is warm and dry. No rash noted. No erythema. There is pallor.  Psychiatric: He has a normal mood and affect. His behavior is normal.    ED Course  Procedures (including critical care time)  DIAGNOSTIC STUDIES: Oxygen Saturation is 98% on room air, normal by my interpretation.    COORDINATION OF CARE: 10:34 AM Discussed course of care with pt. Pt understands and agrees.    Labs Reviewed  CBC WITH DIFFERENTIAL - Abnormal; Notable for the following:    RBC 2.33 (*)    Hemoglobin 7.7 (*)    HCT 23.2 (*)    RDW 17.8 (*)    All other components within normal limits  COMPREHENSIVE METABOLIC PANEL  TROPONIN I  URINALYSIS, ROUTINE W REFLEX MICROSCOPIC  TYPE AND SCREEN   No results found. No diagnosis found.  Date: 02/27/2013  Rate: 60  Rhythm: normal sinus rhythm  QRS Axis: normal  Intervals: normal  ST/T Wave abnormalities: nonspecific ST changes  Conduction Disutrbances:left bundle branch block  Narrative Interpretation:   Old EKG Reviewed: changes noted CRITICAL CARE Performed by: Korde Jeppsen L Total critical care time: 35 Critical care time was exclusive of separately billable procedures and treating other patients. Critical care was necessary to treat or prevent imminent or life-threatening deterioration. Critical care was time spent personally by me on the  following activities: development of treatment plan with patient and/or surrogate as well as nursing, discussions with consultants, evaluation of patient's response to treatment, examination of patient, obtaining history from patient or surrogate, ordering and performing treatments and interventions, ordering and review of laboratory studies, ordering and review of radiographic studies, pulse oximetry and re-evaluation of patient's condition.    MDM   .j The chart was scribed for me under my direct supervision.  I personally performed the history, physical, and medical decision making and all procedures in the evaluation of this patient.Lee Lennert, MD 02/27/13 [phone]

## 2013-02-27 NOTE — ED Notes (Signed)
Pt on chemo. Pt c/o increased weakness x 2 weeks. Pt pale and has hx of anemia recent. Pale x 2 days per pt/family. cbg in route [address]. Pt alert/oriented. deneis pain or sob. Pt only c/o is gen weakness.

## 2013-02-27 NOTE — H&P (Signed)
[facility] History and Physical  HELAMAN MECCA WUJ:[id number] DOB: [birth year] DOA: 02/27/2013  Referring physician:  PCP: [name], MD  Specialists:   Chief Complaint: Worsening and persistent generalized weakness HPI: Lee Ramirez is a 79 y.o. male with a past medical history that includes MDS, hypertension, prostate cancer, ASCVD, ischemic cardiomyopathy presents to the emergency room with a chief complaint of generalized weakness. Information is obtained from the patient and a daughter who is at the bedside. Patient states that he has chronic fatigue related to his MDS but over the last week or so it has gotten worse. At baseline he ambulates with a walker in the last 2 days he's been unable to bear weight at all. He denies any chest pain palpitations shortness of breath. The daughter reports that 2 days ago patient suffered a fall. The patient relates that he fell because "his legs gave way". He denied any dizziness loss of consciousness head injury or any other injury. Of note patient is currently taking Thalomid and has a history of anemia. He was transfused as recently as last month. Patient denies any fever chills nausea vomiting. He denies any abdominal pain diarrhea bright red blood parenchyma melena. He doesn't knowledge tendency towards being constipated which he attributes to the medications that he is on. He denies dysuria hematuria frequency or urgency. At work in the emergency room is significant for a hemoglobin of 7.7 platelets of 90 creatinine of 1.8 . Chest x-ray yields pulmonary vascular congestion with mild cardiomegaly. Vital signs are significant for a blood pressure of 89/31. Patient was given an IV bolus of of normal saline and 2 units of packed red blood cells have been ordered. Of note patient needs radiated blood which comes from [place]. Symptoms came on gradually have persisted and worsened. Characterized as moderate. [facility] are asked  to admit   Review of Systems: The patient denies ,, vision loss, decreased hearing, hoarseness, chest pain, syncope, dyspnea on exertion, peripheral edema, balance deficits, hemoptysis, abdominal pain, melena, hematochezia, severe indigestion/heartburn, hematuria, incontinence, genital sores,  suspicious skin lesions, transient blindness,  depression, unusual weight change, abnormal bleeding, enlarged lymph nodes, angioedema, and breast masses.    Past Medical History  Diagnosis Date  . ASCVD (arteriosclerotic cardiovascular disease)     Sizable non-Q. myocardial infarction in 09/2009; severe three-vessel disease with an ejection fraction of 35%; three-vessel DES complicated by thrombocytopenia and mild renal insufficiency  . Hyperlipidemia     09/2010: TC-151, TG-411, H.-32, L.-not calculated   . Anemia     H/H -12.3/37.2; MCV-103; platelets-114 (08/2010)   . 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 Hypertension   . Orthostatic hypotension     Lightheaded; no syncope  . Thrombocytopenia, acquired     09/2010-114,000  . Hepatic steatosis 2013  . Thrombocytopenia     referred to hem-onc 5/13  . Squamous cell skin cancer 10/2012    of nose  . Myelodysplastic syndrome   . Myelodysplastic syndrome    Past Surgical History  Procedure Laterality Date  . Inguinal hernia repair      Right  . Appendectomy    . Coronary angioplasty with stent placement    . Bone marrow biopsy  02/19/12  . Bone marrow aspiration  02/19/12  . Skin cancer excision  10/2012   Social History:  reports that he has never smoked. He has never used smokeless tobacco. He reports that he does  not drink alcohol or use illicit drugs. Patient lives at home with his wife who has dementia and his daughter. They have a health care aide from 10 AM to 2 PM to Allergies  Allergen Reactions  . Aspirin Anaphylaxis  . Revlimid (Lenalidomide) Rash    History reviewed. No pertinent  family history. family history is reviewed as noncontributory to the admission of this elderly gentleman  Prior to Admission medications   Medication Sig Start Date End Date Taking? Authorizing Provider  acetaminophen (TYLENOL) 500 MG tablet Take 1,000 mg by mouth every 6 (six) hours as needed. Pain   Yes Historical Provider, MD  albuterol (PROAIR HFA) 108 (90 BASE) MCG/ACT inhaler Inhale 2 puffs into the lungs every 6 (six) hours as needed. For shortness of breath   Yes Historical Provider, MD  allopurinol (ZYLOPRIM) 300 MG tablet Take 150 mg by mouth daily.    Yes Historical Provider, MD  Bisacodyl (LAXATIVE PO) Take 1 tablet by mouth daily as needed (constipation).    Yes Historical Provider, MD  carvedilol (COREG) 12.5 MG tablet Take 0.5 tablets (6.25 mg total) by mouth 2 (two) times daily with a meal. 03/11/12 03/11/13 Yes Kathlen Brunswick, MD  CRANBERRY PO Take 1 capsule by mouth daily.   Yes Historical Provider, MD  furosemide (LASIX) 20 MG tablet Take 0.5 tablets (10 mg total) by mouth daily. 03/11/12 03/11/13 Yes Kathlen Brunswick, MD  lisinopril (PRINIVIL,ZESTRIL) 2.5 MG tablet Take 1 tablet (2.5 mg total) by mouth daily. 03/11/12 03/11/13 Yes Kathlen Brunswick, MD  meclizine (ANTIVERT) 12.5 MG tablet Take 12.5 mg by mouth 3 (three) times daily as needed. Dizziness   Yes Historical Provider, MD  Multiple Vitamins-Iron (DAILY-VITAMIN/IRON PO) Take 1 tablet by mouth daily.   Yes Historical Provider, MD  nitroGLYCERIN (NITROSTAT) 0.4 MG SL tablet Place 1 tablet (0.4 mg total) under the tongue every 5 (five) minutes as needed. 01/13/13  Yes Kathlen Brunswick, MD  omeprazole (PRILOSEC) 20 MG capsule Take 20 mg by mouth daily.   Yes Historical Provider, MD  pravastatin (PRAVACHOL) 40 MG tablet Take 1 tablet (40 mg total) by mouth daily. 01/28/13  Yes Kathlen Brunswick, MD  thalidomide (THALOMID) 100 MG capsule Take 1 capsule (100 mg total) by mouth daily. Take with water. 02/09/13  Yes Maurine Minister Kefalas,  PA-C  Travoprost, BAK Free, (TRAVATAN) 0.004 % SOLN ophthalmic solution Place 1 drop into both eyes at bedtime.    Yes Historical Provider, MD  zolpidem (AMBIEN) 10 MG tablet Take 5 mg by mouth at bedtime as needed. For sleep   Yes Historical Provider, MD   Physical Exam: Filed Vitals:   02/27/13 1115 02/27/13 1141 02/27/13 1149 02/27/13 1200  BP:  102/43 103/40 97/58  Pulse: 59 62 59 57  Temp:      TempSrc:      Resp: 20  16 15   Height:      Weight:      SpO2: 100%  100% 100%     General:  Pale thin obviously chronically ill, very hard of hearing  Eyes: PE RRL, EOMI, no scleral icterus  ENT: Ears clear nose without drainage oropharynx without erythema or exudate. Mucous membranes of his mouth are pale and dry.  Neck: Supple no lymphadenopathy full range of motion no obvious JVP  Cardiovascular: Regular rate and rhythm no murmur gallop or rub no lower extremity edema  Respiratory: Normal effort breath sounds clear to auscultation no wheeze no rhonchi no crackles  Abdomen: Flat soft positive bowel sounds nontender to palpation no mass organomegaly noted  Skin: Warm and dry no rash no lesions  Musculoskeletal: No clubbing or cyanosis  Psychiatric: Calm cooperative  Neurologic: Speech clear facial symmetry. Difficulty following commands 2 to severe generalized weakness. A lateral grip 4/5  Labs on Admission:  Basic Metabolic Panel:  Recent Labs Lab 02/27/13 1033  NA 136  K 4.6  CL 102  CO2 26  GLUCOSE 154*  BUN 35*  CREATININE 1.82*  CALCIUM 8.8   Liver Function Tests:  Recent Labs Lab 02/27/13 1033  AST 29  ALT 17  ALKPHOS 280*  BILITOT 1.0  PROT 6.6  ALBUMIN 2.6*   No results found for this basename: LIPASE, AMYLASE,  in the last 168 hours No results found for this basename: AMMONIA,  in the last 168 hours CBC:  Recent Labs Lab 02/27/13 1033  WBC 6.4  NEUTROABS PENDING  HGB 7.7*  HCT 23.2*  MCV 99.6  PLT 90*   Cardiac Enzymes:  Recent  Labs Lab 02/27/13 1033  TROPONINI <0.30    BNP (last 3 results) No results found for this basename: PROBNP,  in the last 8760 hours CBG: No results found for this basename: GLUCAP,  in the last 168 hours  Radiological Exams on Admission: Dg Chest Port 1 View  02/27/2013   *RADIOLOGY REPORT*  Clinical Data: Weakness  PORTABLE CHEST - 1 VIEW  Comparison: Chest x-ray of 01/03/2012  Findings: There is cardiomegaly present with mild pulmonary vascular congestion.  No definite effusion is seen.  No bony palate is noted.  IMPRESSION: Pulmonary vascular congestion with mild cardiomegaly.   Original Report Authenticated By: Dwyane Dee, M.D.    EKG: Independently reviewed. Unusual P axis, possible ectopic atrial rhythm with undetermined rhythm irregularity Left ventricular hypertrophy with QRS widening and repolarization abnormality Cannot rule out Septal infarct (cited on or before 24-Nov-2011) Abnormal ECG When compared with ECG of 24-Nov-2011 20:07,Significant changes have occurred  Assessment/Plan Principal Problem:   Anemia: Likely related to his MDS. Will admit to observation. Will transfuse 2 units of packed red blood cells. Of note patient needs radiated blood which comes from [place]. Signs symptoms of active bleeding. Will monitor closely. Active Problems:  Acute renal failure. Likely related to decreased by mouth intake and diuretics and ACE inhibitor. Will hold ACE inhibitor and Lasix and any other nephrotoxins. She was given 250 cc of normal saline in the emergency department. Will monitor his urine output. Will recheck in the a.m.    Weakness: Somewhat chronic situation that is worsened over the last 3 days. Likely related to #1 transfuse 2 units of packed red blood cells. Will request physical therapy evaluation once acute phase of illness over.    Arteriosclerotic cardiovascular disease (ASCVD)-ischemic cardiomyopathy: Patient seen by Dr. Dietrich Pates in March of this year. Chart  review indicates that Dr. Dietrich Pates opined that there were no symptoms to suggest progression of disease. She'll with severely impaired left ventricular systolic function was well compensated at that time. He noted the dosage of cardiac medications limited by chronic low blood pressure and mild orthostatics. He opted at that time to continue with the current regimen appear. Today chest x-ray shows pulmonary vascular congestion with mild cardiomegaly. Echocardiogram done in May of 2013 yields an ejection fraction of 15%. Patient on 10 mg of Lasix daily. Will hold that for now given his soft blood pressure and acute renal failure. Patient does not appear to be decompensated at this  time. Will monitor his daily weights and strict intake and output. Will continue his beta blocker.      Thrombocytopenia, acquired: Platelets currently 90. Level is just below it appears to be his baseline according to chart review. No signs symptoms of active bleeding. Will use SCDs for DVT prophylaxis. Will monitor carefully    Hyperlipidemia: Patient is on a statin. Chart review indicates his last visit with his cardiologist indicated no need to continue to track his lipids.    Hypertension: Chart review indicates that hypertension has not been an issue since the patient developed severe cardiomyopathy. Notes indicate all antihypertensive drugs being utilized to treat left ventricular dysfunction. Will hold Lasix and lisinopril for now do to #2.    Insomnia: Chronic will continue home medication    GERD (gastroesophageal reflux disease): Stable at baseline    MDS (myelodysplastic syndrome) with 5q- syndrome: And last seen by oncology July 3 of this year. Note indicates overall doing fairly steady without progressive symptoms of anemia. He was recently put on thalidomide 100 and was unable to tolerate dose escalation. Will request oncology consult.      Code Status: DO NOT RESUSCITATE Family Communication: Daughter at  bedside Disposition Plan: Home when ready. Depending on physical therapy evaluation patient may benefit from placement however he is the primary caregiver for his wife who has Alzheimer's. Time spent: 65 minutes  Gwenyth Bender [facility] Pager [phone] If 7PM-7AM, please contact night-coverage [email or web address] Password [facility] 02/27/2013, 12:40 PM Attending: Patient seen and examined. The fatigue is a combination of anemia secondary to myelodysplasia as well as extremely poor cardiac function, ejection fraction 15% when it was measured previously. Patient, appropriately, is DO NOT RESUSCITATE. The daughter states that she is happy that both her parents are at home, and not in a nursing home. Agree with blood transfusion. We will see how we progress is during the hospitalization.

## 2013-02-28 DIAGNOSIS — I428 Other cardiomyopathies: Secondary | ICD-10-CM

## 2013-02-28 DIAGNOSIS — N179 Acute kidney failure, unspecified: Secondary | ICD-10-CM

## 2013-02-28 LAB — COMPREHENSIVE METABOLIC PANEL
AST: 79 U/L — ABNORMAL HIGH (ref 0–37)
CO2: 27 mEq/L (ref 19–32)
Calcium: 8.7 mg/dL (ref 8.4–10.5)
Creatinine, Ser: 1.53 mg/dL — ABNORMAL HIGH (ref 0.50–1.35)
GFR calc Af Amer: 45 mL/min — ABNORMAL LOW (ref >90)
GFR calc non Af Amer: 39 mL/min — ABNORMAL LOW (ref >90)

## 2013-02-28 LAB — CBC
Hemoglobin: 9.3 g/dL — ABNORMAL LOW (ref 13.0–17.0)
MCH: 32.1 pg (ref 26.0–34.0)
RBC: 2.9 MIL/uL — ABNORMAL LOW (ref 4.22–5.81)

## 2013-02-28 NOTE — Discharge Summary (Signed)
Physician Discharge Summary  Lee Ramirez XBM:[id number] DOB: October 06, 1921 DOA: 02/27/2013  PCP: [name], MD  Admit date: 02/27/2013 Discharge date: 02/28/2013  Time spent: Less than 30 minutes  Recommendations for Outpatient Follow-up:  1. Followup with oncology as planned next week.  Discharge Diagnoses:  1. Symptomatic anemia secondary to myelodysplastic syndrome. 2. Cardiomyopathy ejection fraction 15%, no evidence of decompensated heart failure. 3. Hypertension. 4. Acute on chronic kidney disease. Improved.   Discharge Condition: Stable and improved.  Diet recommendation: Regular.  Filed Weights   02/27/13 1030 02/28/13 0514  Weight: 77.111 kg (170 lb) 79.5 kg (175 lb 4.3 oz)    History of present illness:  This 79 year old man, who has myelodysplastic syndrome, presented with symptoms of fatigue. Please see initial history as outlined below: HPI: Lee Ramirez is a 79 y.o. male with a past medical history that includes MDS, hypertension, prostate cancer, ASCVD, ischemic cardiomyopathy presents to the emergency room with a chief complaint of generalized weakness. Information is obtained from the patient and a daughter who is at the bedside. Patient states that he has chronic fatigue related to his MDS but over the last week or so it has gotten worse. At baseline he ambulates with a walker in the last 2 days he's been unable to bear weight at all. He denies any chest pain palpitations shortness of breath. The daughter reports that 2 days ago patient suffered a fall. The patient relates that he fell because "his legs gave way". He denied any dizziness loss of consciousness head injury or any other injury. Of note patient is currently taking Thalomid and has a history of anemia. He was transfused as recently as last month. Patient denies any fever chills nausea vomiting. He denies any abdominal pain diarrhea bright red blood parenchyma melena. He doesn't knowledge tendency  towards being constipated which he attributes to the medications that he is on. He denies dysuria hematuria frequency or urgency. At work in the emergency room is significant for a hemoglobin of 7.7 platelets of 90 creatinine of 1.8 . Chest x-ray yields pulmonary vascular congestion with mild cardiomegaly. Vital signs are significant for a blood pressure of 89/31. Patient was given an IV bolus of of normal saline and 2 units of packed red blood cells have been ordered. Of note patient needs radiated blood which comes from [place]. Symptoms came on gradually have persisted and worsened. Characterized as moderate. [facility] are asked to admit  Hospital Course:  The patient was found to have a hemoglobin of 7.7 which is from his usual hemoglobin in the 9 and 10 range. There was no evidence of GI bleed clinically or by history. It was felt that his drop in hemoglobin was secondary to his myelodysplastic syndrome. He was therefore given 2 units of blood transfusion yesterday. He feels much improved today and is keen to go home. He does have an appointment with his oncologist in the next week or so and he will keep this.  Procedures:  None.   Consultations:  None.  Discharge Exam: Filed Vitals:   02/28/13 0045 02/28/13 0145 02/28/13 0245 02/28/13 0514  BP: 119/69 102/61 94/56 111/65  Pulse: 63 60 58 60  Temp: 98.6 F (37 C) 98.1 F (36.7 C) 97.2 F (36.2 C) 97.4 F (36.3 C)  TempSrc: Oral Oral Oral Oral  Resp: 18 16 16 18   Height:      Weight:    79.5 kg (175 lb 4.3 oz)  SpO2:  92%    General: He still looks chronically sick but does look better hydrated and less pale. Cardiovascular: Heart sounds present without murmurs or added sounds. No evidence of decompensated heart failure. Respiratory: Lung fields are clear. Alert and orientated. Discharge Instructions  Discharge Orders   Future Appointments Provider Department Dept Phone   03/05/2013 9:00 AM [facility] [phone]   NPO after midnight.   03/05/2013 11:00 AM [facility] [phone]   03/19/2013 11:30 AM [facility] [phone]   03/19/2013 12:00 PM [facility] [phone]   Future Orders Complete By Expires     Diet - low sodium heart healthy  As directed     Increase activity slowly  As directed         Medication List    STOP taking these medications       furosemide 20 MG tablet  Commonly known as:  LASIX      TAKE these medications       acetaminophen 500 MG tablet  Commonly known as:  TYLENOL  Take 1,000 mg by mouth every 6 (six) hours as needed. Pain     allopurinol 300 MG tablet  Commonly known as:  ZYLOPRIM  Take 150 mg by mouth daily.     carvedilol 12.5 MG tablet  Commonly known as:  COREG  Take 0.5 tablets (6.25 mg total) by mouth 2 (two) times daily with a meal.     CRANBERRY PO  Take 1 capsule by mouth daily.     DAILY-VITAMIN/IRON PO  Take 1 tablet by mouth daily.     LAXATIVE PO  Take 1 tablet by mouth daily as needed (constipation).     lisinopril 2.5 MG tablet  Commonly known as:  PRINIVIL,ZESTRIL  Take 1 tablet (2.5 mg total) by mouth daily.     meclizine 12.5 MG tablet  Commonly known as:  ANTIVERT  Take 12.5 mg by mouth 3 (three) times daily as needed. Dizziness     nitroGLYCERIN 0.4 MG SL tablet  Commonly known as:  NITROSTAT  Place 1 tablet (0.4 mg total) under the tongue every 5 (five) minutes as needed.     omeprazole 20 MG capsule  Commonly known as:  PRILOSEC  Take 20 mg by mouth daily.     pravastatin 40 MG tablet  Commonly known as:  PRAVACHOL  Take 1 tablet (40 mg total) by mouth daily.     PROAIR HFA 108 (90 BASE) MCG/ACT inhaler  Generic drug:  albuterol  Inhale 2 puffs into the lungs every 6 (six) hours as needed. For shortness of breath     thalidomide 100 MG capsule  Commonly known as:  THALOMID  Take 1 capsule  (100 mg total) by mouth daily. Take with water.     Travoprost (BAK Free) 0.004 % Soln ophthalmic solution  Commonly known as:  TRAVATAN  Place 1 drop into both eyes at bedtime.     zolpidem 10 MG tablet  Commonly known as:  AMBIEN  Take 5 mg by mouth at bedtime as needed. For sleep       Allergies  Allergen Reactions  . Aspirin Anaphylaxis  . Revlimid (Lenalidomide) Rash      The results of significant diagnostics from this hospitalization (including imaging, microbiology, ancillary and laboratory) are listed below for reference.    Significant Diagnostic Studies: Dg Chest Port 1 View  02/27/2013   *  RADIOLOGY REPORT*  Clinical Data: Weakness  PORTABLE CHEST - 1 VIEW  Comparison: Chest x-ray of 01/03/2012  Findings: There is cardiomegaly present with mild pulmonary vascular congestion.  No definite effusion is seen.  No bony palate is noted.  IMPRESSION: Pulmonary vascular congestion with mild cardiomegaly.   Original Report Authenticated By: Dwyane Dee, M.D.        Labs: Basic Metabolic Panel:  Recent Labs Lab 02/27/13 1033 02/28/13 0630  NA 136 136  K 4.6 4.8  CL 102 104  CO2 26 27  GLUCOSE 154* 101*  BUN 35* 34*  CREATININE 1.82* 1.53*  CALCIUM 8.8 8.7   Liver Function Tests:  Recent Labs Lab 02/27/13 1033 02/28/13 0630  AST 29 79*  ALT 17 64*  ALKPHOS 280* 246*  BILITOT 1.0 1.5*  PROT 6.6 6.1  ALBUMIN 2.6* 2.3*     CBC:  Recent Labs Lab 02/27/13 1033 02/28/13 0630  WBC 6.4 6.2  NEUTROABS 3.8  --   HGB 7.7* 9.3*  HCT 23.2* 27.1*  MCV 99.6 93.4  PLT 90* 85*   Cardiac Enzymes:  Recent Labs Lab 02/27/13 1033  TROPONINI <0.30         Signed:  Elchonon Maxson C  [facility] 02/28/2013, 8:16 AM

## 2013-03-01 LAB — TYPE AND SCREEN
ABO/RH(D): O POS
Unit division: 0

## 2013-03-05 ENCOUNTER — Ambulatory Visit (HOSPITAL_COMMUNITY): Payer: Medicare Other

## 2013-03-05 ENCOUNTER — Encounter (HOSPITAL_COMMUNITY): Payer: Medicare Other

## 2013-03-05 DIAGNOSIS — D469 Myelodysplastic syndrome, unspecified: Secondary | ICD-10-CM

## 2013-03-05 LAB — CBC WITH DIFFERENTIAL/PLATELET
Eosinophils Relative: 5 % (ref 0–5)
HCT: 29.6 % — ABNORMAL LOW (ref 39.0–52.0)
Lymphocytes Relative: 36 % (ref 12–46)
Lymphs Abs: 2.2 10*3/uL (ref 0.7–4.0)
MCV: 95.8 fL (ref 78.0–100.0)
Monocytes Absolute: 0.9 10*3/uL (ref 0.1–1.0)
Monocytes Relative: 15 % — ABNORMAL HIGH (ref 3–12)
RBC: 3.09 MIL/uL — ABNORMAL LOW (ref 4.22–5.81)
WBC: 6 10*3/uL (ref 4.0–10.5)

## 2013-03-11 ENCOUNTER — Ambulatory Visit (HOSPITAL_COMMUNITY)
Admission: RE | Admit: 2013-03-11 | Discharge: 2013-03-11 | Disposition: A | Discharge status: Home / Self Care | Payer: Medicare Other | Source: Ambulatory Visit | Attending: Oncology | Admitting: Oncology

## 2013-03-11 DIAGNOSIS — R748 Abnormal levels of other serum enzymes: Secondary | ICD-10-CM

## 2013-03-11 DIAGNOSIS — K838 Other specified diseases of biliary tract: Secondary | ICD-10-CM | POA: Insufficient documentation

## 2013-03-12 ENCOUNTER — Other Ambulatory Visit (HOSPITAL_COMMUNITY): Payer: Self-pay | Admitting: Oncology

## 2013-03-12 DIAGNOSIS — D469 Myelodysplastic syndrome, unspecified: Secondary | ICD-10-CM

## 2013-03-12 MED ORDER — THALIDOMIDE 100 MG PO CAPS: 100.0000 mg | ORAL_CAPSULE | Freq: Every day | ORAL | Status: DC

## 2013-03-19 ENCOUNTER — Encounter (HOSPITAL_BASED_OUTPATIENT_CLINIC_OR_DEPARTMENT_OTHER): Payer: Medicare Other

## 2013-03-19 ENCOUNTER — Encounter (HOSPITAL_BASED_OUTPATIENT_CLINIC_OR_DEPARTMENT_OTHER): Payer: Medicare Other | Admitting: Oncology

## 2013-03-19 ENCOUNTER — Encounter (HOSPITAL_COMMUNITY): Payer: Self-pay | Admitting: Oncology

## 2013-03-19 ENCOUNTER — Encounter (HOSPITAL_COMMUNITY): Payer: Medicare Other

## 2013-03-19 VITALS — BP 100/62 | HR 68 | Temp 97.0°F | Resp 16 | Wt 172.3 lb

## 2013-03-19 DIAGNOSIS — D469 Myelodysplastic syndrome, unspecified: Secondary | ICD-10-CM

## 2013-03-19 DIAGNOSIS — D6959 Other secondary thrombocytopenia: Secondary | ICD-10-CM

## 2013-03-19 DIAGNOSIS — D539 Nutritional anemia, unspecified: Secondary | ICD-10-CM

## 2013-03-19 DIAGNOSIS — D696 Thrombocytopenia, unspecified: Secondary | ICD-10-CM

## 2013-03-19 LAB — CBC WITH DIFFERENTIAL/PLATELET
Basophils Absolute: 0.1 10*3/uL (ref 0.0–0.1)
Basophils Relative: 2 % — ABNORMAL HIGH (ref 0–1)
Eosinophils Relative: 7 % — ABNORMAL HIGH (ref 0–5)
HCT: 26.5 % — ABNORMAL LOW (ref 39.0–52.0)
MCHC: 33.2 g/dL (ref 30.0–36.0)
MCV: 96 fL (ref 78.0–100.0)
Monocytes Absolute: 0.6 10*3/uL (ref 0.1–1.0)
RDW: 17.6 % — ABNORMAL HIGH (ref 11.5–15.5)

## 2013-03-19 LAB — BASIC METABOLIC PANEL
BUN: 23 mg/dL (ref 6–23)
CO2: 25 mEq/L (ref 19–32)
Calcium: 8.9 mg/dL (ref 8.4–10.5)
Creatinine, Ser: 1.17 mg/dL (ref 0.50–1.35)

## 2013-03-19 MED ORDER — DARBEPOETIN ALFA-POLYSORBATE 200 MCG/0.4ML IJ SOLN
200.0000 ug | Freq: Once | INTRAMUSCULAR | Status: AC
  Administered 2013-03-19: 200 ug via SUBCUTANEOUS

## 2013-03-19 MED ORDER — DARBEPOETIN ALFA-POLYSORBATE 200 MCG/0.4ML IJ SOLN
INTRAMUSCULAR | Status: AC
  Filled 2013-03-19: qty 0.4

## 2013-03-19 NOTE — Progress Notes (Signed)
Lee Ruths, MD [address] [place] [address]  Thrombocytopenia, acquired - Plan: SCHEDULING COMMUNICATION INJECTION  Anemia, macrocytic - Plan: SCHEDULING COMMUNICATION INJECTION  MDS (myelodysplastic syndrome) with 5q- syndrome - Plan: ARANESP TREATMENT CONDITION, SCHEDULING COMMUNICATION INJECTION, darbepoetin (ARANESP) injection 200 mcg, SCHEDULING COMMUNICATION LAB  CURRENT THERAPY: Thalidomide 100 mg daily.  Placed on hold starting on 03/19/2013 secondary to progressive weakness and fatigue and failure to thrive.  INTERVAL HISTORY: Lee Ramirez 79 y.o. male returns for  regular  visit for followup of MDS with 5 q- syndrome. Initially treated with Revlimid and then he developed Revlimid-induced urticaria. This was discontinued. No with recent worsening hemoglobin. On thalidomide 100 mg daily with intolerance on dose escalation to 200 mg. Thalidomide placed on hold starting on 03/19/2013 secondary to progressive weakness and fatigue and failure to thrive.    I personally reviewed and went over laboratory results with the patient.  It is noted that the patient's hemoglobin has worsened over the past 2 weeks and is now 8.8 g/dL.  His platelets are stable in the low 100's, and WBC is WNL.    He denies any hematuria, blood in stool, black tarry stools, and blood loss.     His worsening anemia is secondary to failure of Thalidomide, or could be secondary to Thalidomide toxicity.    He admits to increasing fatigue, and weakness.  He feel twice due to weakness.  He has no energy and no appetite.  He does not look well today.  He is failing to thrive.   With this information, we will stop Thalidomide for 4 weeks.  We will start ESA therapy weekly.  We will see how he does over the next 4 weeks and if he improves, we will restart Thalidomide at a reduced dose at 50 mg daily.   Patient and daughter provided education regarding MDS.  Hematologically,  he denies any complaints and ROS questioning is negative.   Past Medical History  Diagnosis Date  . ASCVD (arteriosclerotic cardiovascular disease)     Sizable non-Q. myocardial infarction in 09/2009; severe three-vessel disease with an ejection fraction of 35%; three-vessel DES complicated by thrombocytopenia and mild renal insufficiency  . Hyperlipidemia     09/2010: TC-151, TG-411, H.-32, L.-not calculated   . Anemia     H/H -12.3/37.2; MCV-103; platelets-114 (08/2010)   . 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 Hypertension   . Orthostatic hypotension     Lightheaded; no syncope  . Thrombocytopenia, acquired     09/2010-114,000  . Hepatic steatosis 2013  . Thrombocytopenia     referred to hem-onc 5/13  . Squamous cell skin cancer 10/2012    of nose  . Myelodysplastic syndrome   . Myelodysplastic syndrome     has Arteriosclerotic cardiovascular disease (ASCVD)-ischemic cardiomyopathy; Thrombocytopenia, acquired; Hyperlipidemia; Hypertension; Orthostatic hypotension; Anemia, macrocytic; Insomnia; GERD (gastroesophageal reflux disease); MDS (myelodysplastic syndrome) with 5q- syndrome; Acute renal failure; Anemia; Cardiomyopathy; and Weakness on his problem list.     is allergic to aspirin and revlimid.  Lee Ramirez does not currently have medications on file.  Past Surgical History  Procedure Laterality Date  . Inguinal hernia repair      Right  . Appendectomy    . Coronary angioplasty with stent placement    . Bone marrow biopsy  02/19/12  . Bone marrow aspiration  02/19/12  . Skin cancer excision  10/2012  Denies any headaches, dizziness, double vision, fevers, chills, night sweats, nausea, vomiting, diarrhea, constipation, chest pain, heart palpitations, shortness of breath, blood in stool, black tarry stool, urinary pain, urinary burning, urinary frequency, hematuria.   PHYSICAL EXAMINATION  ECOG PERFORMANCE STATUS: 3 -  Symptomatic, >50% confined to bed  Filed Vitals:   03/19/13 1100  BP: 100/62  Pulse: 68  Temp: 97 F (36.1 C)  Resp: 16    GENERAL:alert, no distress, ill looking and smiling SKIN: skin color, texture, turgor are normal, no rashes or significant lesions HEAD: Normocephalic, No masses, lesions, tenderness or abnormalities EYES: normal, PERRLA, EOMI, Conjunctiva are pink and non-injected EARS: External ears normal OROPHARYNX:mucous membranes are moist  NECK: supple, no adenopathy, thyroid normal size, non-tender, without nodularity, no stridor, non-tender, trachea midline LYMPH:  no palpable lymphadenopathy BREAST:not examined LUNGS: clear to auscultation and percussion HEART: regular rate & rhythm, no murmurs, no gallops, S1 normal and S2 normal ABDOMEN:abdomen soft, non-tender and normal bowel sounds BACK: Back symmetric, no curvature. EXTREMITIES:less then 2 second capillary refill, no joint deformities, effusion, or inflammation, no skin discoloration, no cyanosis  NEURO: alert & oriented x 3 with fluent speech, no focal motor/sensory deficits in wheelchair    LABORATORY DATA: CBC    Component Value Date/Time   WBC 5.1 03/19/2013 1054   RBC 2.76* 03/19/2013 1054   RBC 2.71* 01/07/2012 1630   HGB 8.8* 03/19/2013 1054   HCT 26.5* 03/19/2013 1054   PLT 103* 03/19/2013 1054   MCV 96.0 03/19/2013 1054   MCH 31.9 03/19/2013 1054   MCHC 33.2 03/19/2013 1054   RDW 17.6* 03/19/2013 1054   LYMPHSABS 2.0 03/19/2013 1054   MONOABS 0.6 03/19/2013 1054   EOSABS 0.3 03/19/2013 1054   BASOSABS 0.1 03/19/2013 1054      ASSESSMENT:  1. MDS with 5 q- syndrome. Initially treated with Revlimid and then he developed Revlimid-induced urticaria. This was discontinued. No with recent worsening hemoglobin. On thalidomide 100 mg daily with intolerance on dose escalation to 200 mg. Thalidomide placed on hold starting on 03/19/2013 secondary to progressive weakness and fatigue and failure to thrive.   2.  Revlimid-induced urticaria requiring D/C of this treatment.  3. Progressive fatigue. Multifactorial, but potentially secondary to Thalidomide and anemia 4. Worsening Normocytic, normochromic anemia, will start Aranesp 200 mcg weekly.   Patient Active Problem List   Diagnosis Date Noted  . Acute renal failure 02/27/2013  . Anemia 02/27/2013  . Cardiomyopathy 02/27/2013  . Weakness 02/27/2013  . MDS (myelodysplastic syndrome) with 5q- syndrome 03/19/2012  . GERD (gastroesophageal reflux disease) 11/24/2011  . Insomnia 11/15/2011  . Thrombocytopenia, acquired   . Hyperlipidemia   . Hypertension   . Orthostatic hypotension   . Anemia, macrocytic   . Arteriosclerotic cardiovascular disease (ASCVD)-ischemic cardiomyopathy 11/21/2009      PLAN:  1. I personally reviewed and went over laboratory results with the patient. 2. Hold Thalidomide 100 mg daily x 4 weeks 3. Will start Aranesp 200 mcg every week.  Supportive therapy plan developed. 4. Labs next week: CBC diff, Anemia panel 5. Continue Vitamin B12 injections monthly 6. Depending on lab results over the next 4 weeks, will restart Thalidomide at 50 mg daily after a 4 week break if performance status improves 7. Return in 4 weeks.  THERAPY PLAN:  With the patient's present performance status and complaints, it is reasonable to hold the Thalidomide x 4 weeks and provide ESA support, namely Aranesp 200 mcg every week per NCCN guidelines.  We will see him back in 4 weeks for follow-up.  We can restart Thalidomide at a reduced dose (50 mg daily for example) if he improves.   All questions were answered. The patient knows to call the clinic with any problems, questions or concerns. We can certainly see the patient much sooner if necessary.  Patient and plan discussed with Dr. Erline Hau and he is in agreement with the aforementioned.   Katryna Tschirhart

## 2013-03-19 NOTE — Progress Notes (Signed)
Labs drawn today for cbc/diff,bmp 

## 2013-03-19 NOTE — Patient Instructions (Addendum)
[facility] Discharge Instructions  RECOMMENDATIONS MADE BY THE CONSULTANT AND ANY TEST RESULTS WILL BE SENT TO YOUR REFERRING PHYSICIAN.  HOLD Thalidomide for the next 4 weeks. We will start Aranesp injections today. We will do lab work weekly and give you an Aranesp injection weekly. MD appointment again in 4 weeks.  Thank you for choosing [facility] to provide your oncology and hematology care.  To afford each patient quality time with our providers, please arrive at least 15 minutes before your scheduled appointment time.  With your help, our goal is to use those 15 minutes to complete the necessary work-up to ensure our physicians have the information they need to help with your evaluation and healthcare recommendations.    Effective January 1st, 2014, we ask that you re-schedule your appointment with our physicians should you arrive 10 or more minutes late for your appointment.  We strive to give you quality time with our providers, and arriving late affects you and other patients whose appointments are after yours.    Again, thank you for choosing [facility].  Our hope is that these requests will decrease the amount of time that you wait before being seen by our physicians.       _____________________________________________________________  Should you have questions after your visit to [facility], please contact our office at [phone] between the hours of 8:30 a.m. and 5:00 p.m.  Voicemails left after 4:30 p.m. will not be returned until the following business day.  For prescription refill requests, have your pharmacy contact our office with your prescription refill request.

## 2013-03-19 NOTE — Progress Notes (Signed)
Lee Ramirez presents today for injection per MD orders. Aranesp 200 mcg administered SQ in left Abdomen. Administration without incident. Patient tolerated well.

## 2013-03-27 ENCOUNTER — Encounter (HOSPITAL_COMMUNITY): Payer: Medicare Other | Attending: Internal Medicine

## 2013-03-27 VITALS — BP 118/60 | HR 62

## 2013-03-27 DIAGNOSIS — D469 Myelodysplastic syndrome, unspecified: Secondary | ICD-10-CM | POA: Insufficient documentation

## 2013-03-27 LAB — CBC WITH DIFFERENTIAL/PLATELET
Basophils Absolute: 0.2 10*3/uL — ABNORMAL HIGH (ref 0.0–0.1)
Eosinophils Absolute: 0.5 10*3/uL (ref 0.0–0.7)
Lymphs Abs: 3.5 10*3/uL (ref 0.7–4.0)
MCHC: 33.1 g/dL (ref 30.0–36.0)
MCV: 97.2 fL (ref 78.0–100.0)
Monocytes Absolute: 0.6 10*3/uL (ref 0.1–1.0)
Monocytes Relative: 8 % (ref 3–12)
Platelets: 138 10*3/uL — ABNORMAL LOW (ref 150–400)
RDW: 17.9 % — ABNORMAL HIGH (ref 11.5–15.5)
WBC: 7.6 10*3/uL (ref 4.0–10.5)

## 2013-03-27 LAB — IRON AND TIBC
Iron: 122 ug/dL (ref 42–135)
TIBC: 243 ug/dL (ref 215–435)
UIBC: 121 ug/dL — ABNORMAL LOW (ref 125–400)

## 2013-03-27 MED ORDER — DARBEPOETIN ALFA-POLYSORBATE 200 MCG/0.4ML IJ SOLN
200.0000 ug | Freq: Once | INTRAMUSCULAR | Status: AC
  Administered 2013-03-27: 200 ug via SUBCUTANEOUS

## 2013-03-27 MED ORDER — DARBEPOETIN ALFA-POLYSORBATE 200 MCG/0.4ML IJ SOLN
INTRAMUSCULAR | Status: AC
  Filled 2013-03-27: qty 0.4

## 2013-03-27 NOTE — Progress Notes (Signed)
Aranesp 200 mcg given sub-q to lower right abd.  Tolerated well.  Home via wheelchair accompanied by family member. Specimen obtained from right ac without difficulty for labs.

## 2013-03-28 LAB — FOLATE: Folate: >20 ng/mL

## 2013-03-28 LAB — FERRITIN: Ferritin: 924 ng/mL — ABNORMAL HIGH (ref 22–322)

## 2013-03-30 ENCOUNTER — Other Ambulatory Visit: Payer: Self-pay | Admitting: Cardiology

## 2013-03-31 ENCOUNTER — Telehealth (HOSPITAL_COMMUNITY): Payer: Self-pay | Admitting: Oncology

## 2013-04-03 ENCOUNTER — Ambulatory Visit (HOSPITAL_COMMUNITY): Payer: Medicare Other

## 2013-04-06 ENCOUNTER — Inpatient Hospital Stay (HOSPITAL_COMMUNITY): Payer: Medicare Other

## 2013-04-06 ENCOUNTER — Telehealth (HOSPITAL_COMMUNITY): Payer: Self-pay

## 2013-04-06 ENCOUNTER — Other Ambulatory Visit (HOSPITAL_COMMUNITY): Payer: Self-pay | Admitting: Oncology

## 2013-04-06 ENCOUNTER — Inpatient Hospital Stay (HOSPITAL_COMMUNITY)
Admission: AD | Admit: 2013-04-06 | Discharge: 2013-04-07 | DRG: 812 | Disposition: A | Discharge status: Home / Self Care | Payer: Medicare Other | Source: Ambulatory Visit | Attending: Family Medicine | Admitting: Family Medicine

## 2013-04-06 ENCOUNTER — Encounter (HOSPITAL_BASED_OUTPATIENT_CLINIC_OR_DEPARTMENT_OTHER): Payer: Medicare Other

## 2013-04-06 ENCOUNTER — Encounter (HOSPITAL_COMMUNITY): Payer: Self-pay

## 2013-04-06 VITALS — BP 77/32 | HR 74

## 2013-04-06 VITALS — BP 82/48 | HR 70 | Temp 97.4°F

## 2013-04-06 DIAGNOSIS — E860 Dehydration: Secondary | ICD-10-CM | POA: Diagnosis present

## 2013-04-06 DIAGNOSIS — Z66 Do not resuscitate: Secondary | ICD-10-CM | POA: Diagnosis present

## 2013-04-06 DIAGNOSIS — R627 Adult failure to thrive: Secondary | ICD-10-CM | POA: Diagnosis present

## 2013-04-06 DIAGNOSIS — N179 Acute kidney failure, unspecified: Secondary | ICD-10-CM | POA: Diagnosis present

## 2013-04-06 DIAGNOSIS — I252 Old myocardial infarction: Secondary | ICD-10-CM

## 2013-04-06 DIAGNOSIS — E785 Hyperlipidemia, unspecified: Secondary | ICD-10-CM | POA: Diagnosis present

## 2013-04-06 DIAGNOSIS — D46C Myelodysplastic syndrome with isolated del(5q) chromosomal abnormality: Secondary | ICD-10-CM

## 2013-04-06 DIAGNOSIS — D649 Anemia, unspecified: Secondary | ICD-10-CM

## 2013-04-06 DIAGNOSIS — D469 Myelodysplastic syndrome, unspecified: Secondary | ICD-10-CM

## 2013-04-06 DIAGNOSIS — C4490 Unspecified malignant neoplasm of skin, unspecified: Secondary | ICD-10-CM | POA: Diagnosis present

## 2013-04-06 DIAGNOSIS — I2510 Atherosclerotic heart disease of native coronary artery without angina pectoris: Secondary | ICD-10-CM | POA: Diagnosis present

## 2013-04-06 DIAGNOSIS — R531 Weakness: Secondary | ICD-10-CM | POA: Diagnosis present

## 2013-04-06 DIAGNOSIS — Z8546 Personal history of malignant neoplasm of prostate: Secondary | ICD-10-CM

## 2013-04-06 DIAGNOSIS — I5022 Chronic systolic (congestive) heart failure: Secondary | ICD-10-CM | POA: Diagnosis present

## 2013-04-06 DIAGNOSIS — I959 Hypotension, unspecified: Secondary | ICD-10-CM | POA: Diagnosis present

## 2013-04-06 DIAGNOSIS — I509 Heart failure, unspecified: Secondary | ICD-10-CM

## 2013-04-06 DIAGNOSIS — D696 Thrombocytopenia, unspecified: Secondary | ICD-10-CM | POA: Diagnosis present

## 2013-04-06 DIAGNOSIS — Z923 Personal history of irradiation: Secondary | ICD-10-CM

## 2013-04-06 DIAGNOSIS — K7689 Other specified diseases of liver: Secondary | ICD-10-CM | POA: Diagnosis present

## 2013-04-06 DIAGNOSIS — D630 Anemia in neoplastic disease: Principal | ICD-10-CM | POA: Diagnosis present

## 2013-04-06 DIAGNOSIS — R5381 Other malaise: Secondary | ICD-10-CM

## 2013-04-06 DIAGNOSIS — I10 Essential (primary) hypertension: Secondary | ICD-10-CM | POA: Diagnosis present

## 2013-04-06 LAB — CBC WITH DIFFERENTIAL/PLATELET
Basophils Relative: 1 % (ref 0–1)
Eosinophils Relative: 1 % (ref 0–5)
HCT: 18.9 % — ABNORMAL LOW (ref 39.0–52.0)
Lymphs Abs: 2.3 10*3/uL (ref 0.7–4.0)
MCH: 32.5 pg (ref 26.0–34.0)
MCV: 99 fL (ref 78.0–100.0)
Monocytes Absolute: 0.6 10*3/uL (ref 0.1–1.0)
Neutro Abs: 3.7 10*3/uL (ref 1.7–7.7)
Platelets: 82 10*3/uL — ABNORMAL LOW (ref 150–400)
RBC: 1.91 MIL/uL — ABNORMAL LOW (ref 4.22–5.81)

## 2013-04-06 LAB — COMPREHENSIVE METABOLIC PANEL WITH GFR
ALT: 61 U/L — ABNORMAL HIGH (ref 0–53)
AST: 98 U/L — ABNORMAL HIGH (ref 0–37)
Albumin: 2.3 g/dL — ABNORMAL LOW (ref 3.5–5.2)
Alkaline Phosphatase: 354 U/L — ABNORMAL HIGH (ref 39–117)
BUN: 52 mg/dL — ABNORMAL HIGH (ref 6–23)
CO2: 21 meq/L (ref 19–32)
Calcium: 8.7 mg/dL (ref 8.4–10.5)
Chloride: 104 meq/L (ref 96–112)
Creatinine, Ser: 1.67 mg/dL — ABNORMAL HIGH (ref 0.50–1.35)
GFR calc Af Amer: 41 mL/min — ABNORMAL LOW
GFR calc non Af Amer: 35 mL/min — ABNORMAL LOW
Glucose, Bld: 130 mg/dL — ABNORMAL HIGH (ref 70–99)
Potassium: 4.4 meq/L (ref 3.5–5.1)
Sodium: 138 meq/L (ref 135–145)
Total Bilirubin: 0.7 mg/dL (ref 0.3–1.2)
Total Protein: 6.5 g/dL (ref 6.0–8.3)

## 2013-04-06 MED ORDER — LATANOPROST 0.005 % OP SOLN
OPHTHALMIC | Status: AC
  Filled 2013-04-06: qty 2.5

## 2013-04-06 MED ORDER — MECLIZINE HCL 12.5 MG PO TABS: 12.5000 mg | ORAL_TABLET | Freq: Three times a day (TID) | ORAL | Status: DC | PRN

## 2013-04-06 MED ORDER — ACETAMINOPHEN 325 MG PO TABS
650.0000 mg | ORAL_TABLET | Freq: Four times a day (QID) | ORAL | Status: DC | PRN
  Administered 2013-04-07: 650 mg via ORAL
  Filled 2013-04-06: qty 2

## 2013-04-06 MED ORDER — ONDANSETRON HCL 4 MG/2ML IJ SOLN: 4.0000 mg | Freq: Four times a day (QID) | INTRAMUSCULAR | Status: DC | PRN

## 2013-04-06 MED ORDER — LATANOPROST 0.005 % OP SOLN
1.0000 [drp] | Freq: Every day | OPHTHALMIC | Status: DC
  Administered 2013-04-06: 1 [drp] via OPHTHALMIC
  Filled 2013-04-06: qty 2.5

## 2013-04-06 MED ORDER — PANTOPRAZOLE SODIUM 40 MG PO TBEC
40.0000 mg | DELAYED_RELEASE_TABLET | Freq: Every day | ORAL | Status: DC
  Administered 2013-04-06 – 2013-04-07 (×2): 40 mg via ORAL
  Filled 2013-04-06 (×2): qty 1

## 2013-04-06 MED ORDER — SODIUM CHLORIDE 0.9 % IV SOLN
INTRAVENOUS | Status: DC
  Administered 2013-04-06: 15:00:00 via INTRAVENOUS

## 2013-04-06 MED ORDER — DARBEPOETIN ALFA-POLYSORBATE 200 MCG/0.4ML IJ SOLN
INTRAMUSCULAR | Status: AC
  Filled 2013-04-06: qty 0.4

## 2013-04-06 MED ORDER — ALLOPURINOL 300 MG PO TABS
150.0000 mg | ORAL_TABLET | Freq: Every day | ORAL | Status: DC
  Administered 2013-04-06 – 2013-04-07 (×2): 150 mg via ORAL
  Filled 2013-04-06 (×2): qty 1

## 2013-04-06 MED ORDER — ONDANSETRON HCL 4 MG PO TABS: 4.0000 mg | ORAL_TABLET | Freq: Four times a day (QID) | ORAL | Status: DC | PRN

## 2013-04-06 MED ORDER — DARBEPOETIN ALFA-POLYSORBATE 200 MCG/0.4ML IJ SOLN
200.0000 ug | Freq: Once | INTRAMUSCULAR | Status: AC
  Administered 2013-04-06: 200 ug via SUBCUTANEOUS

## 2013-04-06 MED ORDER — SODIUM CHLORIDE 0.9 % IV SOLN
INTRAVENOUS | Status: DC
  Administered 2013-04-06: 23:00:00 via INTRAVENOUS

## 2013-04-06 MED ORDER — SIMVASTATIN 20 MG PO TABS
20.0000 mg | ORAL_TABLET | Freq: Every day | ORAL | Status: DC
  Administered 2013-04-06: 20 mg via ORAL
  Filled 2013-04-06: qty 1

## 2013-04-06 MED ORDER — ACETAMINOPHEN 650 MG RE SUPP: 650.0000 mg | Freq: Four times a day (QID) | RECTAL | Status: DC | PRN

## 2013-04-06 MED ORDER — SODIUM CHLORIDE 0.9 % IJ SOLN: 3.0000 mL | Freq: Two times a day (BID) | INTRAMUSCULAR | Status: DC

## 2013-04-06 NOTE — Progress Notes (Signed)
[facility] Telephone:([phone]   [phone]  OFFICE PROGRESS NOTE  [name], MD [address] [place] [address]  DIAGNOSIS: MDS.  ONCOLOGIC HISTORY: [name] is an 79 year old gentleman who is being treated for 5q minus MDS. Please refer to his previous visit note for details of  his oncologic history.  INTERVAL HISTORY:   DUAYNE BRIDEAU 79 y.o. male we'll came for scheduled ESA therapy and was reportedly noted to be very weak and not as alert as you'll used to be. At the request of his family I was asked to see him.  Patient was not able to give meaningful history but tells me he does not feel well.  He reportedly has chronic a hearing impairment also. He was accompanied by his brother-in-law Mr Johna Sheriff and his Son 'Brentley'. His son did tell me that patient lives with his daughter did not come for this visit. There was no report of fever or nausea or vomiting or diarrhea.  Patient was noted by our RN  Ms Rolly Salter to have low BP as below.    REVIEW OF SYSTEMS: 14 point review of system is as in the history above otherwise negative.  PHYSICAL EXAMINATION:  Blood pressure 82/48, pulse 70, temperature 97.4 F (36.3 C), temperature source Oral. GENERAL: No acute distress. Frail elderly and very weak SKIN:  No rashes or significant lesions . Multiple  ecchymosis in the hands mostly. HEAD: Normocephalic, No masses, lesions, tenderness or abnormalities  EYES: Conjunctiva are very pale but no jaundice LUNGS: Clear to auscultation , no crackles or wheezes HEART: regular rate & rhythm, no murmurs, no gallops, S1 normal and S2 normal and no S3. ABDOMEN: Abdomen soft, non-tender, no masses or organomegaly and no hepatosplenomegaly palpable MSK: No CVA tenderness and no tenderness on percussion of the back or rib cage. EXTREMITIES: No edema, no skin discoloration or tenderness     LABORATORY DATA: Lab Results    Component Value Date   WBC 6.8 04/06/2013   HGB 6.2* 04/06/2013   HCT 18.9* 04/06/2013   MCV 99.0 04/06/2013   PLT 82* 04/06/2013      Chemistry      Component Value Date/Time   NA 138 04/06/2013 1410   K 4.4 04/06/2013 1410   CL 104 04/06/2013 1410   CO2 21 04/06/2013 1410   BUN 52* 04/06/2013 1410   CREATININE 1.67* 04/06/2013 1410   CREATININE 1.35 03/07/2012 1210      Component Value Date/Time   CALCIUM 8.7 04/06/2013 1410   ALKPHOS 354* 04/06/2013 1410   AST 98* 04/06/2013 1410   ALT 61* 04/06/2013 1410   BILITOT 0.7 04/06/2013 1410       ASSESSMENT:  Mr. Verno  has MDS,which is presently being treated supportive ESA therapy. Given his severe anemia today, it seems that he may not be responding well ,even though the dose can be adjusted upwards. I'm concerned that given his overall decline in performance status (3-4) , that patient could be progressing into acute leukemia.  Given his age, poor performance status and generally good bad outcome of secondary AML I don't think that doing a bone marrow biopsy to prove this point is necessary especially considering no reasonable treatment that would significantly impact the outcome positively.    The family is concerned about his present status and I had a very long discussion with him ,his son and the brother-in-law.  I explained to  them The process of progression to acute leukemia even though his present condition this could represent an infection which potentially could be treated and patient improves, in which case we can continue with supportive ESA therapy .  I also cautioned them that patient may not survive this episode . At this point I feel that the brief hospitalization to see how he improves would be reasonable. He had severe anemia based on today's blood work and what was likely benefit symptomatically from blood transfusion.  Patient is gradually becoming transfusion dependent which is not a good sign. His hypotension is likely  form orthostasis from decreased oral intake however sepsis is not this could.  PLAN:  1. While he was in the exam room I ordered infusion of saline at 574m l/hr with the intent of giving 1 liter. 2. I did  discuss with Dr. Kerry Hough, who agreed to accept him for direct hospitalization. 2. I also discussed with his family that I would not advocate any aggressive measures and they are in agreement for patient to be a DNR and avoiding Measures the unlikely to significantly impact overall outcome.  His overall prognosis is very poor. 3. If and when Patient is discharged we'll follow up with him in clinic. 4.  We also plan to follow with him while he is in the hospital.    All questions were satisfactorily answered. Patient knows to call if  any concern arises.  I spent more than 50 % counseling the patient face to face. The total time spent in the appointment was 40 minutes.   Sherral Hammers, MD FACP. Hematology/Oncology.

## 2013-04-06 NOTE — Progress Notes (Unsigned)
aranesp 200 mcg given sub-q to lower left abd.  Tolerated well. Pt BP very low.  MD will see today.  Mr. Guardia is accompanied by his sons.

## 2013-04-06 NOTE — Telephone Encounter (Signed)
CRITICAL VALUE ALERT Critical value received:  HGB 6.2 & HCT 18.9 Date of notification:  04/06/13 Time of notification: 1451 Critical value read back:  yes Nurse who received alert:  Tobie Lords, RN MD notified (1st page):  Dellis Anes, PA-C and Dr. Sharia Reeve immediately.

## 2013-04-06 NOTE — H&P (Signed)
[facility] History and Physical  Lee Ramirez ZOX:[id number] DOB: Jan 01, 1922 DOA: 04/06/2013  Referring physician: Dr. Lanny Cramp PCP: [name], MD  Specialists: [facility]  Chief Complaint: weakness  HPI: Lee Ramirez is a 79 y.o. male with history of myelodysplastic syndrome presented to the oncology clinic today in followup. His daughter reported that he was becoming increasingly weak over the last several days to week. His by mouth intake has been very poor. He has been somewhat drowsy as well. Does not appear to have had a fever. No nausea vomiting, diarrhea. The patient does report some dysuria onset he said this for a month. He has not had any melena or hematochezia. No shortness of breath, cough, chest pain. He was evaluated in the oncology clinic and felt to be dehydrated. He was also hypotensive. Blood work indicated an acute renal failure as well as significant anemia with a hemoglobin of 6. The patient was transferred to the inpatient unit as a direct admission.  Review of Systems: limited due to patient's mental status, but pertinent positives as per HPI, otherwise negative  Past Medical History  Diagnosis Date  . ASCVD (arteriosclerotic cardiovascular disease)     Sizable non-Q. myocardial infarction in 09/2009; severe three-vessel disease with an ejection fraction of 35%; three-vessel DES complicated by thrombocytopenia and mild renal insufficiency  . Hyperlipidemia     09/2010: TC-151, TG-411, H.-32, L.-not calculated   . Anemia     H/H -12.3/37.2; MCV-103; platelets-114 (08/2010)   . 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 Hypertension   . Orthostatic hypotension     Lightheaded; no syncope  . Thrombocytopenia, acquired     09/2010-114,000  . Hepatic steatosis 2013  . Thrombocytopenia     referred to hem-onc 5/13  . Squamous cell skin cancer 10/2012    of nose  . Myelodysplastic syndrome   .  Myelodysplastic syndrome    Past Surgical History  Procedure Laterality Date  . Inguinal hernia repair      Right  . Appendectomy    . Coronary angioplasty with stent placement    . Bone marrow biopsy  02/19/12  . Bone marrow aspiration  02/19/12  . Skin cancer excision  10/2012   Social History:  reports that he has never smoked. He has never used smokeless tobacco. He reports that he does not drink alcohol or use illicit drugs.   Allergies  Allergen Reactions  . Aspirin Anaphylaxis  . Revlimid [Lenalidomide] Rash    Family history: Both parents of the patient are deceased of heart trouble  Prior to Admission medications   Medication Sig Start Date End Date Taking? Authorizing Provider  acetaminophen (TYLENOL) 500 MG tablet Take 1,000 mg by mouth every 6 (six) hours as needed. Pain    Historical Provider, MD  albuterol (PROAIR HFA) 108 (90 BASE) MCG/ACT inhaler Inhale 2 puffs into the lungs every 6 (six) hours as needed. For shortness of breath    Historical Provider, MD  allopurinol (ZYLOPRIM) 300 MG tablet Take 150 mg by mouth daily.     Historical Provider, MD  Bisacodyl (LAXATIVE PO) Take 1 tablet by mouth daily as needed (constipation).     Historical Provider, MD  carvedilol (COREG) 12.5 MG tablet Take 0.5 tablets (6.25 mg total) by mouth 2 (two) times daily with a meal. 03/11/12 03/11/13  Kathlen Brunswick, MD  carvedilol (COREG) 6.25 MG tablet TAKE 1 TABLET BY MOUTH  TWICE DAILY WITH A MEAL. 03/30/13   Laqueta Linden, MD  CRANBERRY PO Take 1 capsule by mouth daily.    Historical Provider, MD  lisinopril (PRINIVIL,ZESTRIL) 2.5 MG tablet Take 1 tablet (2.5 mg total) by mouth daily. 03/11/12 03/11/13  Kathlen Brunswick, MD  meclizine (ANTIVERT) 12.5 MG tablet Take 12.5 mg by mouth 3 (three) times daily as needed. Dizziness    Historical Provider, MD  Multiple Vitamins-Iron (DAILY-VITAMIN/IRON PO) Take 1 tablet by mouth daily.    Historical Provider, MD  nitroGLYCERIN (NITROSTAT)  0.4 MG SL tablet Place 1 tablet (0.4 mg total) under the tongue every 5 (five) minutes as needed. 01/13/13   Kathlen Brunswick, MD  omeprazole (PRILOSEC) 20 MG capsule Take 20 mg by mouth daily.    Historical Provider, MD  pravastatin (PRAVACHOL) 40 MG tablet Take 1 tablet (40 mg total) by mouth daily. 01/28/13   Kathlen Brunswick, MD  thalidomide (THALOMID) 100 MG capsule Take 1 capsule (100 mg total) by mouth daily. Take with water. 03/12/13   Ellouise Newer, PA-C  Travoprost, BAK Free, (TRAVATAN) 0.004 % SOLN ophthalmic solution Place 1 drop into both eyes at bedtime.     Historical Provider, MD  zolpidem (AMBIEN) 10 MG tablet Take 5 mg by mouth at bedtime as needed. For sleep    Historical Provider, MD   Physical Exam: Filed Vitals:   04/06/13 1700  Resp: 16     General:  No acute distress, alert and oriented  Eyes: Pupils are equal round react to light  ENT: Mucous membranes are moist  Neck: Supple  Cardiovascular: S1, S2, regular rate and rhythm  Respiratory: Clear to auscultation bilaterally  Abdomen: Soft, nontender, positive bowel sounds  Skin: No rashes  Musculoskeletal: No peripheral edema bilaterally  Psychiatric: Pleasant, occasionally confused, cooperative with exam  Neurologic: Grossly intact, nonfocal  Labs on Admission:  Basic Metabolic Panel:  Recent Labs Lab 04/06/13 1410  NA 138  K 4.4  CL 104  CO2 21  GLUCOSE 130*  BUN 52*  CREATININE 1.67*  CALCIUM 8.7   Liver Function Tests:  Recent Labs Lab 04/06/13 1410  AST 98*  ALT 61*  ALKPHOS 354*  BILITOT 0.7  PROT 6.5  ALBUMIN 2.3*   No results found for this basename: LIPASE, AMYLASE,  in the last 168 hours No results found for this basename: AMMONIA,  in the last 168 hours CBC:  Recent Labs Lab 04/06/13 1410  WBC 6.8  NEUTROABS 3.7  HGB 6.2*  HCT 18.9*  MCV 99.0  PLT 82*   Cardiac Enzymes: No results found for this basename: CKTOTAL, CKMB, CKMBINDEX, TROPONINI,  in the last  168 hours  BNP (last 3 results) No results found for this basename: PROBNP,  in the last 8760 hours CBG: No results found for this basename: GLUCAP,  in the last 168 hours  Radiological Exams on Admission: No results found.   Assessment/Plan Active Problems:   Thrombocytopenia, acquired   MDS (myelodysplastic syndrome) with 5q- syndrome   Acute renal failure   Anemia   Weakness   Hypotension, unspecified   Chronic systolic CHF (congestive heart failure)   1. Anemia. There is concerned that the patient's myelodysplastic syndrome has converted to acute leukemia. Oncology will continue to follow in the hospital. If this is the case, his prognosis is very poor. He'll be transfused 2 units of PRBCs for symptomatic improvement. We'll also check stool occult blood for any signs of bleeding. 2. Acute  renal failure. Likely due to dehydration. We'll give IV fluid recheck. 3. Generalized weakness. Likely due to anemia and dehydration. Continue to follow. 4. Hypotension. Likely due to volume depletion. Will also check chest x-ray and urinalysis to rule out any infectious source. 5. Chronic systolic congestive heart failure, ejection fraction of 15%. Appears to be compensated at this time.   Code Status: DNR, discussed with oncology and the patient's daughter and treatment should be conservative. Family Communication: discussed with daughter Lee Ramirez over the phone who is POA Disposition Plan: discharge home once improved  Time spent:  Lee Ramirez [facility] Pager [phone]  If 7PM-7AM, please contact night-coverage [email or web address] Password [facility] 04/06/2013, 7:48 PM

## 2013-04-07 DIAGNOSIS — D6959 Other secondary thrombocytopenia: Secondary | ICD-10-CM

## 2013-04-07 LAB — CBC
HCT: 23.9 % — ABNORMAL LOW (ref 39.0–52.0)
MCH: 31.8 pg (ref 26.0–34.0)
MCV: 92.6 fL (ref 78.0–100.0)
Platelets: 74 10*3/uL — ABNORMAL LOW (ref 150–400)
RDW: 20.5 % — ABNORMAL HIGH (ref 11.5–15.5)
WBC: 5.7 10*3/uL (ref 4.0–10.5)

## 2013-04-07 LAB — BASIC METABOLIC PANEL
BUN: 49 mg/dL — ABNORMAL HIGH (ref 6–23)
CO2: 22 mEq/L (ref 19–32)
Calcium: 8.7 mg/dL (ref 8.4–10.5)
Chloride: 104 mEq/L (ref 96–112)
Creatinine, Ser: 1.47 mg/dL — ABNORMAL HIGH (ref 0.50–1.35)
GFR calc Af Amer: 47 mL/min — ABNORMAL LOW (ref >90)

## 2013-04-07 NOTE — Progress Notes (Signed)
Pt d/c via wheelchair, IV was d/c. Pt and his daughter verbalize understanding of d/c instructions, follow up appt and med changes. No questions at this time. Sheryn Bison

## 2013-04-07 NOTE — Progress Notes (Signed)
UR chart review completed.  

## 2013-04-07 NOTE — Progress Notes (Signed)
[facility] PROGRESS NOTE  Lee Ramirez HYQ:[id number] DOB: 1922/03/03 DOA: 04/06/2013 PCP: [name], MD  Assessment/Plan: 1. Anemia: Acute on chronic. Concern for conversion of myelodysplastic syndrome to acute leukemia. Transfused 2 units packed red blood cells.  2. Acute renal failure: Likely secondary to dehydration. IV fluids. 3. Generalized weakness: Multifactorial. Secondary to anemia, dehydration. Physical therapy evaluation. 4. Hypotension: Likely secondary to volume depletion. No signs of infection. 5. Chronic systolic congestive heart failure, ejection fraction 15%. Appears compensated. 6. History of 3 vessel coronary artery disease 7. Chronic thrombocytopenia 8. Myelodysplastic syndrome: Thalidomide on hold secondary to progressive weakness, fatigue, failure to thrive.   Followup CBC, basic metabolic panel  Physical therapy consultation  Given desire for conservative treatment, consider discharge home later today  Code Status: DNR, conservative treatment per family DVT prophylaxis: SCDs Family Communication: None present Disposition Plan: As above  Brendia Sacks, MD  [facility]  Pager [phone] If 7PM-7AM, please contact night-coverage at [email or web address], password [facility] 04/07/2013, 8:13 AM  LOS: 1 day   Clinical Summary: 79 year old man with history of myelodysplastic syndrome seen in the oncology clinic for increasing weakness, poor oral intake, drowsiness. He was noted to be dehydrated, hypotensive and blood work revealed acute renal failure and significant anemia. Patient was directly admitted to the hospital from oncology clinic for treatment of anemia, acute renal failure and further evaluation of generalized weakness.  Consultants:  Oncology  Procedures:  8/18 transfusion 2 units packed red blood cells  Antibiotics:    HPI/Subjective: No complaints. Feels well. Feels stronger today. Wants to go home.  Objective: Filed  Vitals:   04/07/13 0450 04/07/13 0525 04/07/13 0634 04/07/13 0713  BP: 114/60 102/54 110/67 106/54  Pulse: 68  63   Temp: 97.2 F (36.2 C) 97.5 F (36.4 C) 97.6 F (36.4 C) 97 F (36.1 C)  TempSrc: Oral Oral Oral Oral  Resp: 18 20 20    Height:      Weight:      SpO2:  95% 97%     Intake/Output Summary (Last 24 hours) at 04/07/13 0813 Last data filed at 04/07/13 0634  Gross per 24 hour  Intake    490 ml  Output      0 ml  Net    490 ml     Filed Weights   04/06/13 1600 04/06/13 1700  Weight: 73.7 kg (162 lb 7.7 oz) 73.7 kg (162 lb 7.7 oz)    Exam:   Afebrile, vital signs stable.  General: Appears calm and comfortable. Speech fluent and clear.  Cardiovascular: Regular rate and rhythm. No murmur, rub, gallop. No lower extremity edema.  Respiratory: Clear to auscultation bilaterally. No wheezes, rales, rhonchi. Normal respiratory effort.  Data Reviewed:  No laboratory studies yet this morning  BUN and creatinine were elevated on admission above previous value 03/19/2013.  Hemoglobin 6.2 on admission, platelet count 82  Pending studies:   TSH  Scheduled Meds: . allopurinol  150 mg Oral Daily  . latanoprost  1 drop Both Eyes QHS  . pantoprazole  40 mg Oral Daily  . simvastatin  20 mg Oral q1800  . sodium chloride  3 mL Intravenous Q12H   Continuous Infusions: . sodium chloride 50 mL/hr at 04/06/13 2309    Active Problems:   Thrombocytopenia, acquired   MDS (myelodysplastic syndrome) with 5q- syndrome   Acute renal failure   Anemia   Weakness   Hypotension, unspecified   Chronic systolic CHF (congestive heart failure)   Time spent  20 minutes

## 2013-04-07 NOTE — Progress Notes (Signed)
Bag #2 of 2 PRBC verified and started with Nurse Micheal, will continue to monitor

## 2013-04-07 NOTE — Progress Notes (Signed)
Patient asleep, blood infusion at 100cc/hr, no adverse reactions, see doc flow sheet for vitals.

## 2013-04-07 NOTE — Progress Notes (Signed)
#  1 of 2 PRBC's completed, no adverse reactions, see doc flow sheet for vitals.

## 2013-04-07 NOTE — Discharge Summary (Signed)
Physician Discharge Summary  Lee Ramirez:[id number] DOB: 1922/04/28 DOA: 04/06/2013  PCP: [name], MD  Admit date: 04/06/2013 Discharge date: 04/07/2013  Recommendations for Outpatient Follow-up:  1. Followup anemia, thrombocytopenia, myelodysplastic syndrome 2. Followup acute renal failure, consider repeat basic metabolic panel as clinically indicated  3. Lisinopril put on hold secondary to low normal blood pressure.  Follow-up Information   Follow up with [name], MD In 2 weeks.   Specialty:  Family Medicine   Contact information:   [address] [place] [address] [phone]       Follow up with Erline Hau, C, MD. Schedule an appointment as soon as possible for a visit in 1 week.   Specialty:  Hematology and Oncology   Contact information:   [address] [place] [address] [phone]      Discharge Diagnoses:  1. Anemia, acute on chronic, likely secondary to myelodysplastic syndrome 2. Acute renal failure 3. Generalized weakness 4. Hypotension 5. Chronic systolic congestive heart failure with ejection fraction 15% 6. History 3 vessel coronary artery disease 7. Chronic thrombocytopenia 8. Myelodysplastic syndrome  Discharge Condition: Improved Disposition: Home, home health PT  Diet recommendation: Regular  Filed Weights   04/06/13 1600 04/06/13 1700  Weight: 73.7 kg (162 lb 7.7 oz) 73.7 kg (162 lb 7.7 oz)    History of present illness:  79 year old man with history of myelodysplastic syndrome seen in the oncology clinic for increasing weakness, poor oral intake, drowsiness. He was noted to be dehydrated, hypotensive and blood work revealed acute renal failure and significant anemia. Patient was directly admitted to the hospital from oncology clinic for treatment of anemia, acute renal failure and further evaluation of generalized weakness.  Hospital Course:  Mr. Denio was admitted for  transfusion packed red blood cells and IV fluids. After 2 units packed red blood cells he felt much better, hemoglobin appropriately increased. Creatinine improved with IV fluids. Today he feels much better and would like to go home. He did well with physical therapy. Discussed with oncologist Dr. Sharia Reeve, review case, he concurs to discharge today. Followup in the office next week. Discussed above with daughter, reviewed all laboratory studies with her. Plan is for conservative management per oncology with which the family was in agreement.  1. Anemia: Acute on chronic. Concern for conversion of myelodysplastic syndrome to acute leukemia. Transfused 2 units packed red blood cells.  2. Acute renal failure: Likely secondary to dehydration. IV fluids. 3. Generalized weakness: Multifactorial. Secondary to anemia, dehydration. Physical therapy evaluation. 4. Hypotension: Likely secondary to volume depletion. No signs of infection. 5. Chronic systolic congestive heart failure, ejection fraction 15%. Appears compensated. 6. History of 3 vessel coronary artery disease 7. Chronic thrombocytopenia 8. Myelodysplastic syndrome: Thalidomide on hold secondary to progressive weakness, fatigue, failure to thrive.  Discharge Instructions  Discharge Orders   Future Appointments Provider Department Dept Phone   04/10/2013 1:00 PM Ap-[facility]cap[facility] Chair [facility] [phone]   04/16/2013 12:30 PM [facility] Chair 7 [facility] [phone]   04/17/2013 12:00 PM Claudia Desanctis [facility] [phone]   04/17/2013 1:00 PM [facility] Chair 7 [facility] [phone]   Future Orders Complete By Expires   Diet general  As directed    Discharge instructions  As directed    Comments:     Stop lisinopril as blood pressure has been low normal. Call your physician or seek immediate medical assistance  for increased weakness, shortness of breath or worsening of condition.    Increase activity slowly  As directed        Medication List    STOP taking these medications       lisinopril 2.5 MG tablet  Commonly known as:  PRINIVIL,ZESTRIL      TAKE these medications       acetaminophen 500 MG tablet  Commonly known as:  TYLENOL  Take 1,000 mg by mouth every 6 (six) hours as needed. Pain     allopurinol 300 MG tablet  Commonly known as:  ZYLOPRIM  Take 300 mg by mouth daily.     carvedilol 6.25 MG tablet  Commonly known as:  COREG  Take 6.25 mg by mouth 2 (two) times daily with a meal.     CRANBERRY PO  Take 1 capsule by mouth daily.     DAILY-VITAMIN/IRON PO  Take 1 tablet by mouth daily.     meclizine 12.5 MG tablet  Commonly known as:  ANTIVERT  Take 12.5 mg by mouth 3 (three) times daily as needed. Dizziness     nitroGLYCERIN 0.4 MG SL tablet  Commonly known as:  NITROSTAT  Place 1 tablet (0.4 mg total) under the tongue every 5 (five) minutes as needed.     omeprazole 20 MG capsule  Commonly known as:  PRILOSEC  Take 20 mg by mouth daily.     pravastatin 40 MG tablet  Commonly known as:  PRAVACHOL  Take 1 tablet (40 mg total) by mouth daily.     PROAIR HFA 108 (90 BASE) MCG/ACT inhaler  Generic drug:  albuterol  Inhale 2 puffs into the lungs every 6 (six) hours as needed. For shortness of breath     Travoprost (BAK Free) 0.004 % Soln ophthalmic solution  Commonly known as:  TRAVATAN  Place 1 drop into both eyes at bedtime.     zolpidem 10 MG tablet  Commonly known as:  AMBIEN  Take 5 mg by mouth at bedtime as needed. For sleep       Allergies  Allergen Reactions  . Aspirin Anaphylaxis  . Revlimid [Lenalidomide] Rash    The results of significant diagnostics from this hospitalization (including imaging, microbiology, ancillary and laboratory) are listed below for reference.    Significant Diagnostic Studies: Portable Chest 1 View  04/06/2013   *RADIOLOGY REPORT*  Clinical Data: Generalized weakness  PORTABLE CHEST  - 1 VIEW  Comparison: 03/02/2013  Findings: Normal cardiac silhouette.  Lungs are mildly hyperinflated.  No effusion, infiltrate, or pneumothorax.  Chronic bronchitic change centrally.  IMPRESSION:  Chronic bronchitic markings.  No acute findings.   Original Report Authenticated By: Genevive Bi, M.D.   US Abdomen Limited Ruq  03/11/2013   *RADIOLOGY REPORT*  Clinical Data:  Elevated alkaline phosphatase.  LIMITED ABDOMINAL ULTRASOUND - RIGHT UPPER QUADRANT  Comparison:  None.  Findings:  Gallbladder:  Small amount of sludge.  No visible shadowing stones. No wall thickening or negative sonographic Murphy's.  Common bile duct:  Normal caliber, 4 mm.  Liver:  Normal size and echotexture.  No focal abnormality.  No biliary ductal dilatation.  IMPRESSION: Small amount of sludge in the gallbladder.  No acute findings.                    Original Report Authenticated By: Charlett Nose, M.D.    Labs: Basic Metabolic Panel:  Recent Labs Lab 04/06/13 1410 04/07/13 1051  NA 138 136  K  4.4 4.2  CL 104 104  CO2 21 22  GLUCOSE 130* 113*  BUN 52* 49*  CREATININE 1.67* 1.47*  CALCIUM 8.7 8.7   Liver Function Tests:  Recent Labs Lab 04/06/13 1410  AST 98*  ALT 61*  ALKPHOS 354*  BILITOT 0.7  PROT 6.5  ALBUMIN 2.3*   CBC:  Recent Labs Lab 04/06/13 1410 04/07/13 1051  WBC 6.8 5.7  NEUTROABS 3.7  --   HGB 6.2* 8.2*  HCT 18.9* 23.9*  MCV 99.0 92.6  PLT 82* 74*    Active Problems:   Thrombocytopenia, acquired   MDS (myelodysplastic syndrome) with 5q- syndrome   Acute renal failure   Anemia   Weakness   Hypotension, unspecified   Chronic systolic CHF (congestive heart failure)   Time coordinating discharge: 35 minutes  Signed:  Brendia Sacks, MD [facility] 04/07/2013, 3:09 PM

## 2013-04-07 NOTE — Progress Notes (Addendum)
In preparing with witness Tonny Bollman, RN) blood verification, when patient was scanned in EPIC the usual requested rate  column did not appear, patient re scanned again, same results, blood label scanned EPIC does not recognize label, EPIC would not allow me to advance, Lab notified Fritzi Mandes), Blood verified and hung, AC Lynden Ang) Notified who informed me to call Service Response, situation was explained to data entry clerk  and INCIDENT # [phone] was given to me.

## 2013-04-07 NOTE — Care Management Note (Addendum)
    Page 1 of 2   04/07/2013     3:36:12 PM   CARE MANAGEMENT NOTE 04/07/2013  Patient:  Lee Ramirez, Lee Ramirez   Account Number:  [id number]  Date Initiated:  04/07/2013  Documentation initiated by:  Sharrie Rothman  Subjective/Objective Assessment:   Pt admitted from home with anemia and acute renal failure. Pt lives with his wife and daughter. Pt has a walker, w/c, cane, and BSC for home use. Pt has a caregiver from [place] 10:30-5;00 daily while daughter is at work.     Action/Plan:   Pt and daughter denies any HH or DME needs at this time.   Anticipated DC Date:  04/08/2013   Anticipated DC Plan:  HOME/SELF CARE      DC Planning Services  CM consult      [facility] Choice  HOME HEALTH   Choice offered to / List presented to:  C-1 Patient        HH arranged  HH-2 PT      HH agency  [facility]   Status of service:  Completed, signed off Medicare Important Message given?  YES (If response is "NO", the following Medicare IM given date fields will be blank) Date Medicare IM given:  04/07/2013 Date Additional Medicare IM given:    Discharge Disposition:  HOME W HOME HEALTH SERVICES  Per UR Regulation:    If discussed at Long Length of Stay Meetings, dates discussed:    Comments:  04/07/13 1535 Arlyss Queen, RN BSN CM Pt discharged home today with [facility] PT. Alroy Bailiff of [facility] is aware and will collect the pts information from the chart. No DME needs noted. Hh services to start within 48 hours. Pt and pts nurse aware of discharge arrangements.   04/07/13 1345 Arlyss Queen, RN BSN CM

## 2013-04-07 NOTE — Progress Notes (Addendum)
Blood administration stopped at this time. No complications. transfused. Sheryn Bison

## 2013-04-07 NOTE — Evaluation (Signed)
Physical Therapy Evaluation Patient Details Name: Lee Ramirez MRN: [id number] DOB: 03/29/1922 Today's Date: 04/07/2013 Time: 7829-5621 PT Time Calculation (min): 30 min  PT Assessment / Plan / Recommendation History of Present Illness  Pt is admitted with severe anemia.  He also is found to have acute renal failure as well as dehydration with concomitant weakness.  Clinical Impression  Pt was seen for an evaluation.  He is alert and very cooperative and found to have generalized weakness/deconditioning.  He is able to ambulate a functional distance with a walker and should be stable enough to be able to discharge to home with HHPT.  He otherwise has excellent family/CG support at home.    PT Assessment  All further PT needs can be met in the next venue of care    Follow Up Recommendations  Home health PT    Does the patient have the potential to tolerate intense rehabilitation    no  Barriers to Discharge        Equipment Recommendations  None recommended by PT    Recommendations for Other Services     Frequency      Precautions / Restrictions Precautions Precautions: Fall Restrictions Weight Bearing Restrictions: No   Pertinent Vitals/Pain       Mobility  Bed Mobility Bed Mobility: Supine to Sit;Sit to Supine Supine to Sit: 4: Min assist;HOB flat Sit to Supine: 4: Min guard;HOB flat Transfers Transfers: Sit to Stand;Stand to Sit Sit to Stand: 5: Supervision;From bed;With upper extremity assist Stand to Sit: 5: Supervision;To bed;With upper extremity assist Ambulation/Gait Ambulation/Gait Assistance: 5: Supervision Ambulation Distance (Feet): 100 Feet Assistive device: Rolling walker Gait Pattern: Within Functional Limits Gait velocity: WNL General Gait Details: does hold LLE in external rotation, pt states because of 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         Prior Functioning  Home Living Family/patient expects to be discharged to:: Private residence Living Arrangements: Spouse/significant other Available Help at Discharge: Personal care attendant;Family;Available 24 hours/day Type of Home: House Home Access: Level entry Home Layout: One level Home Equipment: Cane - single point;Walker - 2 wheels;Shower seat;Hand held shower head;Grab bars - tub/shower Prior Function Level of Independence: Needs assistance Gait / Transfers Assistance Needed: ambulates with cane and SBA at home, although he does use cane at times ADL's / Homemaking Assistance Needed: needs assist with bathing, dressing and all homemaking Comments: wife is ill and has onset of demential...family has full coverage with paid CG while daughter is at work Communication Communication: Surveyor, mining Arousal/Alertness: Awake/alert Behavior During Therapy: WFL for tasks assessed/performed Overall Cognitive Status: Within Functional Limits for tasks assessed    Extremity/Trunk Assessment Lower Extremity Assessment Lower Extremity Assessment: Generalized weakness Cervical / Trunk Assessment Cervical / Trunk Assessment: Kyphotic   Balance    End of Session PT - End of Session Equipment Utilized During Treatment: Gait belt Activity Tolerance: Patient limited by fatigue Patient left: in bed;with call bell/phone within reach;with nursing/sitter in room;with family/visitor present  Nurse Communication: Mobility status  GP     Konrad Penta 04/07/2013, 2:43 PM

## 2013-04-08 LAB — TYPE AND SCREEN
ABO/RH(D): O POS
Antibody Screen: NEGATIVE
Unit division: 0

## 2013-04-10 ENCOUNTER — Ambulatory Visit (HOSPITAL_COMMUNITY): Payer: Medicare Other

## 2013-04-15 ENCOUNTER — Encounter (HOSPITAL_BASED_OUTPATIENT_CLINIC_OR_DEPARTMENT_OTHER): Payer: Medicare Other

## 2013-04-15 ENCOUNTER — Encounter (HOSPITAL_COMMUNITY): Payer: Self-pay

## 2013-04-15 VITALS — BP 99/59 | HR 93 | Temp 97.4°F | Resp 18 | Wt 165.6 lb

## 2013-04-15 DIAGNOSIS — D469 Myelodysplastic syndrome, unspecified: Secondary | ICD-10-CM

## 2013-04-15 DIAGNOSIS — D46C Myelodysplastic syndrome with isolated del(5q) chromosomal abnormality: Secondary | ICD-10-CM

## 2013-04-15 DIAGNOSIS — I959 Hypotension, unspecified: Secondary | ICD-10-CM

## 2013-04-15 DIAGNOSIS — D649 Anemia, unspecified: Secondary | ICD-10-CM

## 2013-04-15 LAB — CBC WITH DIFFERENTIAL/PLATELET
Basophils Absolute: 0.1 10*3/uL (ref 0.0–0.1)
Eosinophils Absolute: 0.3 10*3/uL (ref 0.0–0.7)
Hemoglobin: 8.6 g/dL — ABNORMAL LOW (ref 13.0–17.0)
Lymphocytes Relative: 49 % — ABNORMAL HIGH (ref 12–46)
Lymphs Abs: 3.6 10*3/uL (ref 0.7–4.0)
MCH: 31.9 pg (ref 26.0–34.0)
MCV: 96.7 fL (ref 78.0–100.0)
Monocytes Relative: 5 % (ref 3–12)
Neutrophils Relative %: 42 % — ABNORMAL LOW (ref 43–77)
RBC: 2.7 MIL/uL — ABNORMAL LOW (ref 4.22–5.81)
WBC: 7.4 10*3/uL (ref 4.0–10.5)

## 2013-04-15 LAB — COMPREHENSIVE METABOLIC PANEL
AST: 34 U/L (ref 0–37)
Albumin: 2.4 g/dL — ABNORMAL LOW (ref 3.5–5.2)
Calcium: 9.1 mg/dL (ref 8.4–10.5)
Chloride: 102 mEq/L (ref 96–112)
Creatinine, Ser: 0.93 mg/dL (ref 0.50–1.35)
Sodium: 137 mEq/L (ref 135–145)

## 2013-04-15 MED ORDER — DARBEPOETIN ALFA-POLYSORBATE 500 MCG/ML IJ SOLN
300.0000 ug | Freq: Once | INTRAMUSCULAR | Status: AC
  Administered 2013-04-15: 300 ug via SUBCUTANEOUS

## 2013-04-15 MED ORDER — DARBEPOETIN ALFA-POLYSORBATE 500 MCG/ML IJ SOLN
INTRAMUSCULAR | Status: AC
  Filled 2013-04-15: qty 1

## 2013-04-15 NOTE — Patient Instructions (Addendum)
[facility] Discharge Instructions  RECOMMENDATIONS MADE BY THE CONSULTANT AND ANY TEST RESULTS WILL BE SENT TO YOUR REFERRING PHYSICIAN.  EXAM FINDINGS BY THE PHYSICIAN TODAY AND SIGNS OR SYMPTOMS TO REPORT TO CLINIC OR PRIMARY PHYSICIAN: Exam and discussion by Dr. Sharia Reeve.  Will check blood work today to see what your blood counts are to determine whether to continue with the Aranesp.  We will continue your aranesp and will increase it to weekly.  MEDICATIONS PRESCRIBED:  none  INSTRUCTIONS GIVEN AND DISCUSSED: Report increased fatigue or shortness of breath.  SPECIAL INSTRUCTIONS/FOLLOW-UP: Follow-up to be seen in 2 weeks with PA.  Thank you for choosing [facility] to provide your oncology and hematology care.  To afford each patient quality time with our providers, please arrive at least 15 minutes before your scheduled appointment time.  With your help, our goal is to use those 15 minutes to complete the necessary work-up to ensure our physicians have the information they need to help with your evaluation and healthcare recommendations.    Effective January 1st, 2014, we ask that you re-schedule your appointment with our physicians should you arrive 10 or more minutes late for your appointment.  We strive to give you quality time with our providers, and arriving late affects you and other patients whose appointments are after yours.    Again, thank you for choosing [facility].  Our hope is that these requests will decrease the amount of time that you wait before being seen by our physicians.       _____________________________________________________________  Should you have questions after your visit to [facility], please contact our office at [phone] between the hours of 8:30 a.m. and 5:00 p.m.  Voicemails left after 4:30 p.m. will not be returned until the following business day.  For prescription refill  requests, have your pharmacy contact our office with your prescription refill request.

## 2013-04-15 NOTE — Progress Notes (Signed)
[facility] [phone]   [phone]  OFFICE PROGRESS NOTE  Lee Ruths, MD [address] [name] [address]  DIAGNOSIS: 5q- MDS   INTERVAL HISTORY:   [name] is is an 79 year old gentleman with history of 5Q minus MDS. Previous therapy included the Revlimid to which she developed the 40.  2 subsequently he was tried on a thalidomide which he did not tolerate well . Prior to last week patient was on every 3 weeks 200 mg Aranesp with suboptimal response . During one of his ESA last week he had severe weakness and was slightly hypotensive and also had severe anemia.  He was hospitalized briefly in transfused and discharged. We had also introduced the idea of palliative care/hospice at that time. He is here today to discuss next line of action.  He came with his daughter who lives with him.  Patient reportedly fell last Saturday but was caught by his daughter.  He reports fatigue he denies shortness of breath, reports decreased appetite.  He has cough relative of clear sputum otherwise denies any new problem.     MEDICAL HISTORY: Past Medical History  Diagnosis Date  . ASCVD (arteriosclerotic cardiovascular disease)     Sizable non-Q. myocardial infarction in 09/2009; severe three-vessel disease with an ejection fraction of 35%; three-vessel DES complicated by thrombocytopenia and mild renal insufficiency  . Hyperlipidemia     09/2010: TC-151, TG-411, H.-32, L.-not calculated   . Anemia     H/H -12.3/37.2; MCV-103; platelets-114 (08/2010)   . 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 Hypertension   . Orthostatic hypotension     Lightheaded; no syncope  . Thrombocytopenia, acquired     09/2010-114,000  . Hepatic steatosis 2013  . Thrombocytopenia     referred to hem-onc 5/13  . Squamous cell skin cancer 10/2012    of nose  . Myelodysplastic syndrome   . Myelodysplastic  syndrome     ALLERGIES:  is allergic to aspirin and revlimid.  MEDICATIONS:  Current Outpatient Prescriptions  Medication Sig Dispense Refill  . acetaminophen (TYLENOL) 500 MG tablet Take 1,000 mg by mouth every 6 (six) hours as needed. Pain      . albuterol (PROAIR HFA) 108 (90 BASE) MCG/ACT inhaler Inhale 2 puffs into the lungs every 6 (six) hours as needed. For shortness of breath      . allopurinol (ZYLOPRIM) 300 MG tablet Take 300 mg by mouth daily.      . carvedilol (COREG) 6.25 MG tablet Take 6.25 mg by mouth 2 (two) times daily with a meal.      . CRANBERRY PO Take 1 capsule by mouth daily.      . meclizine (ANTIVERT) 12.5 MG tablet Take 12.5 mg by mouth 3 (three) times daily as needed. Dizziness      . Multiple Vitamins-Iron (DAILY-VITAMIN/IRON PO) Take 1 tablet by mouth daily.      . nitroGLYCERIN (NITROSTAT) 0.4 MG SL tablet Place 1 tablet (0.4 mg total) under the tongue every 5 (five) minutes as needed.  25 tablet  3  . omeprazole (PRILOSEC) 20 MG capsule Take 20 mg by mouth daily.      . pravastatin (PRAVACHOL) 40 MG tablet Take 1 tablet (40 mg total) by mouth daily.  30 tablet  3  . Travoprost, BAK Free, (TRAVATAN) 0.004 % SOLN ophthalmic solution Place 1 drop into both  eyes at bedtime.       Marland Kitchen zolpidem (AMBIEN) 10 MG tablet Take 5 mg by mouth at bedtime as needed. For sleep       No current facility-administered medications for this visit.    SURGICAL HISTORY:  Past Surgical History  Procedure Laterality Date  . Inguinal hernia repair      Right  . Appendectomy    . Coronary angioplasty with stent placement    . Bone marrow biopsy  02/19/12  . Bone marrow aspiration  02/19/12  . Skin cancer excision  10/2012     REVIEW OF SYSTEMS: 14 point review of system is as in the history above otherwise negative.  PHYSICAL EXAMINATION:  Blood pressure 99/59, pulse 93, temperature 97.4 F (36.3 C), temperature source Oral, resp. rate 18, weight 165 lb 9.6 oz (75.116  kg). GENERAL: No acute distress. Wheelchair confined. SKIN:  No rashes or significant lesions . multiple ecchymosis in the hands. HEAD: Normocephalic, No masses, lesions, tenderness or abnormalities  EYES: Conjunctiva are pink and non-injected and no jaundice ENT: External ears normal ,lips, buccal mucosa, and tongue normal and mucous membranes are moist . No evidence of thrush. LUNGS: Clear to auscultation , no crackles or wheezes HEART: regular rate & rhythm, no murmurs, no gallops, S1 normal and S2 normal and no S3. ABDOMEN: Abdomen soft, non-tender, no masses or organomegaly and no hepatosplenomegaly palpable EXTREMITIES: No edema, no skin discoloration or tenderness NEURO: Alert & oriented .     LABORATORY DATA: Lab Results  Component Value Date   WBC 5.7 04/07/2013   HGB 8.2* 04/07/2013   HCT 23.9* 04/07/2013   MCV 92.6 04/07/2013   PLT 74* 04/07/2013      Chemistry      Component Value Date/Time   NA 136 04/07/2013 1051   K 4.2 04/07/2013 1051   CL 104 04/07/2013 1051   CO2 22 04/07/2013 1051   BUN 49* 04/07/2013 1051   CREATININE 1.47* 04/07/2013 1051   CREATININE 1.35 03/07/2012 1210      Component Value Date/Time   CALCIUM 8.7 04/07/2013 1051   ALKPHOS 354* 04/06/2013 1410   AST 98* 04/06/2013 1410   ALT 61* 04/06/2013 1410   BILITOT 0.7 04/06/2013 1410       RADIOGRAPHIC STUDIES: Portable Chest 1 View  04/06/2013   *RADIOLOGY REPORT*  Clinical Data: Generalized weakness  PORTABLE CHEST - 1 VIEW  Comparison: 03/02/2013  Findings: Normal cardiac silhouette.  Lungs are mildly hyperinflated.  No effusion, infiltrate, or pneumothorax.  Chronic bronchitic change centrally.  IMPRESSION:  Chronic bronchitic markings.  No acute findings.   Original Report Authenticated By: Genevive Bi, M.D.     ASSESSMENT:  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 He was hospitalized last week for severe  anemia. I had a long discussion with him and his daughter .  We discussed that if patient's needs blood transfusion today because of very low hemoglobin,  then it will be evidence that the ESA therapy is not working and focusing on palliative measures only would be reasonable otherwise we'll continue with the increased dose of Aranesp at 367mcg/week.  PLAN:  1. Return to clinic in 2 weeks. 2. Order a CBC CMP today.  Based on results we'll determine if Aranesp is to be continued to. 3. We discussed that at some point, entirely palliative measure/hospice would be appropriate.  If patient becomes transfusion dependent which is  a poor prognostic factor then these measures will be most relevant.   All questions were satisfactorily answered. Patient knows to call if  any concern arises.  I spent more than 50 % counseling the patient face to face. The total time spent in the appointment was 30 minutes.   Sherral Hammers, MD FACP. Hematology/Oncology.   Addendum:CBC result today reviewed, patient's hemoglobin is stable and I think that trial of Aranesp at 300 mcg every week is reasonable.

## 2013-04-15 NOTE — Progress Notes (Signed)
Lee Ramirez presented for labwork. Labs per MD order drawn via Peripheral Line 23 gauge needle inserted in right AC  Good blood return present. Procedure without incident.  Needle removed intact. Patient tolerated procedure well.  Lee Ramirez presents today for injection per MD orders. Aranesp administered SQ in right Abdomen. Administration without incident. Patient tolerated well.

## 2013-04-16 ENCOUNTER — Ambulatory Visit (HOSPITAL_COMMUNITY): Payer: Medicare Other

## 2013-04-16 ENCOUNTER — Other Ambulatory Visit (HOSPITAL_COMMUNITY): Payer: Self-pay | Admitting: *Deleted

## 2013-04-16 DIAGNOSIS — D469 Myelodysplastic syndrome, unspecified: Secondary | ICD-10-CM

## 2013-04-17 ENCOUNTER — Ambulatory Visit (HOSPITAL_COMMUNITY): Payer: Medicare Other | Admitting: Oncology

## 2013-04-17 ENCOUNTER — Ambulatory Visit (HOSPITAL_COMMUNITY): Payer: Medicare Other

## 2013-04-22 ENCOUNTER — Encounter (HOSPITAL_COMMUNITY): Payer: Medicare Other | Attending: Internal Medicine

## 2013-04-22 ENCOUNTER — Encounter (HOSPITAL_COMMUNITY): Payer: Medicare Other

## 2013-04-22 VITALS — BP 112/64 | HR 90

## 2013-04-22 DIAGNOSIS — D469 Myelodysplastic syndrome, unspecified: Secondary | ICD-10-CM

## 2013-04-22 LAB — CBC WITH DIFFERENTIAL/PLATELET
Eosinophils Absolute: 0.2 10*3/uL (ref 0.0–0.7)
Eosinophils Relative: 5 % (ref 0–5)
Hemoglobin: 7.5 g/dL — ABNORMAL LOW (ref 13.0–17.0)
Lymphs Abs: 2.4 10*3/uL (ref 0.7–4.0)
MCH: 31.3 pg (ref 26.0–34.0)
MCV: 95.8 fL (ref 78.0–100.0)
Monocytes Absolute: 0.4 10*3/uL (ref 0.1–1.0)
Monocytes Relative: 7 % (ref 3–12)
RBC: 2.4 MIL/uL — ABNORMAL LOW (ref 4.22–5.81)

## 2013-04-22 LAB — PREPARE RBC (CROSSMATCH)

## 2013-04-22 MED ORDER — DARBEPOETIN ALFA-POLYSORBATE 300 MCG/0.6ML IJ SOLN
300.0000 ug | Freq: Once | INTRAMUSCULAR | Status: AC
  Administered 2013-04-22: 300 ug via SUBCUTANEOUS

## 2013-04-22 MED ORDER — CYANOCOBALAMIN 1000 MCG/ML IJ SOLN
1000.0000 ug | Freq: Once | INTRAMUSCULAR | Status: AC
  Administered 2013-04-22: 1000 ug via INTRAMUSCULAR

## 2013-04-22 MED ORDER — DARBEPOETIN ALFA-POLYSORBATE 300 MCG/0.6ML IJ SOLN
INTRAMUSCULAR | Status: AC
  Filled 2013-04-22: qty 0.6

## 2013-04-22 MED ORDER — CYANOCOBALAMIN 1000 MCG/ML IJ SOLN
INTRAMUSCULAR | Status: AC
  Filled 2013-04-22: qty 1

## 2013-04-22 NOTE — Addendum Note (Signed)
Addended byLeida Lauth on: 04/22/2013 12:46 PM   Modules accepted: Orders

## 2013-04-22 NOTE — Progress Notes (Signed)
Labs drawn today for cbc/diff 

## 2013-04-22 NOTE — Progress Notes (Signed)
Lee Ramirez presents today for injection per MD orders. Aranesp 300 mcg administered SQ in left Abdomen and vitamin b12 1000 mcg IM in rt deltoid. Administration without incident. Patient tolerated well. Will return 9/4 for transfusion.

## 2013-04-23 ENCOUNTER — Encounter (HOSPITAL_BASED_OUTPATIENT_CLINIC_OR_DEPARTMENT_OTHER): Payer: Medicare Other

## 2013-04-23 VITALS — BP 112/69 | HR 88 | Temp 97.0°F | Resp 18

## 2013-04-23 DIAGNOSIS — D469 Myelodysplastic syndrome, unspecified: Secondary | ICD-10-CM

## 2013-04-23 DIAGNOSIS — D46C Myelodysplastic syndrome with isolated del(5q) chromosomal abnormality: Secondary | ICD-10-CM

## 2013-04-23 MED ORDER — FUROSEMIDE 10 MG/ML IJ SOLN
20.0000 mg | Freq: Once | INTRAMUSCULAR | Status: AC
  Administered 2013-04-23: 20 mg via INTRAVENOUS

## 2013-04-23 MED ORDER — ACETAMINOPHEN 325 MG PO TABS
ORAL_TABLET | ORAL | Status: AC
  Filled 2013-04-23: qty 2

## 2013-04-23 MED ORDER — SODIUM CHLORIDE 0.9 % IJ SOLN
10.0000 mL | INTRAMUSCULAR | Status: DC | PRN
  Filled 2013-04-23: qty 10

## 2013-04-23 MED ORDER — FUROSEMIDE 10 MG/ML IJ SOLN
INTRAMUSCULAR | Status: AC
  Filled 2013-04-23: qty 2

## 2013-04-23 MED ORDER — SODIUM CHLORIDE 0.9 % IV SOLN
250.0000 mL | Freq: Once | INTRAVENOUS | Status: AC
  Administered 2013-04-23: 250 mL via INTRAVENOUS

## 2013-04-23 MED ORDER — ACETAMINOPHEN 325 MG PO TABS
650.0000 mg | ORAL_TABLET | Freq: Once | ORAL | Status: AC
  Administered 2013-04-23: 650 mg via ORAL

## 2013-04-23 MED ORDER — DIPHENHYDRAMINE HCL 25 MG PO CAPS
25.0000 mg | ORAL_CAPSULE | Freq: Once | ORAL | Status: AC
  Administered 2013-04-23: 25 mg via ORAL

## 2013-04-23 MED ORDER — DIPHENHYDRAMINE HCL 25 MG PO CAPS
ORAL_CAPSULE | ORAL | Status: AC
  Filled 2013-04-23: qty 1

## 2013-04-23 NOTE — Progress Notes (Signed)
Tolerated well, home via w/c accompanied by his daughter.

## 2013-04-24 LAB — TYPE AND SCREEN: Unit division: 0

## 2013-04-27 ENCOUNTER — Other Ambulatory Visit: Payer: Self-pay | Admitting: *Deleted

## 2013-04-27 MED ORDER — PRAVASTATIN SODIUM 40 MG PO TABS: 40.0000 mg | ORAL_TABLET | Freq: Every day | ORAL | Status: AC

## 2013-04-28 ENCOUNTER — Ambulatory Visit (HOSPITAL_COMMUNITY): Payer: Medicare Other | Admitting: Oncology

## 2013-04-29 ENCOUNTER — Ambulatory Visit (HOSPITAL_COMMUNITY): Payer: Medicare Other

## 2013-04-30 ENCOUNTER — Encounter (HOSPITAL_COMMUNITY): Payer: Self-pay

## 2013-04-30 ENCOUNTER — Encounter (HOSPITAL_BASED_OUTPATIENT_CLINIC_OR_DEPARTMENT_OTHER): Payer: Medicare Other | Admitting: Oncology

## 2013-04-30 ENCOUNTER — Encounter (HOSPITAL_COMMUNITY): Payer: Medicare Other

## 2013-04-30 ENCOUNTER — Encounter (HOSPITAL_BASED_OUTPATIENT_CLINIC_OR_DEPARTMENT_OTHER): Payer: Medicare Other

## 2013-04-30 VITALS — BP 94/58 | HR 76 | Temp 97.5°F | Resp 16

## 2013-04-30 DIAGNOSIS — D469 Myelodysplastic syndrome, unspecified: Secondary | ICD-10-CM

## 2013-04-30 DIAGNOSIS — D46C Myelodysplastic syndrome with isolated del(5q) chromosomal abnormality: Secondary | ICD-10-CM

## 2013-04-30 LAB — CBC WITH DIFFERENTIAL/PLATELET
Basophils Relative: 0 % (ref 0–1)
Eosinophils Absolute: 0.2 10*3/uL (ref 0.0–0.7)
Eosinophils Relative: 2 % (ref 0–5)
Hemoglobin: 8.4 g/dL — ABNORMAL LOW (ref 13.0–17.0)
Lymphs Abs: 3.2 10*3/uL (ref 0.7–4.0)
MCH: 31.7 pg (ref 26.0–34.0)
MCHC: 33.1 g/dL (ref 30.0–36.0)
MCV: 95.8 fL (ref 78.0–100.0)
Monocytes Absolute: 0.5 10*3/uL (ref 0.1–1.0)
Monocytes Relative: 7 % (ref 3–12)
RBC: 2.65 MIL/uL — ABNORMAL LOW (ref 4.22–5.81)

## 2013-04-30 MED ORDER — DARBEPOETIN ALFA-POLYSORBATE 300 MCG/0.6ML IJ SOLN
300.0000 ug | Freq: Once | INTRAMUSCULAR | Status: AC
  Administered 2013-04-30: 300 ug via SUBCUTANEOUS

## 2013-04-30 MED ORDER — DARBEPOETIN ALFA-POLYSORBATE 300 MCG/0.6ML IJ SOLN
INTRAMUSCULAR | Status: AC
  Filled 2013-04-30: qty 0.6

## 2013-04-30 NOTE — Patient Instructions (Addendum)
[facility] Discharge Instructions  RECOMMENDATIONS MADE BY THE CONSULTANT AND ANY TEST RESULTS WILL BE SENT TO YOUR REFERRING PHYSICIAN.  EXAM FINDINGS BY THE PHYSICIAN TODAY AND SIGNS OR SYMPTOMS TO REPORT TO CLINIC OR PRIMARY PHYSICIAN:   We are going to give you 2 units of blood tomorrow.   We will recheck your blood in 7 days as well as give you the Aranesp injection and see the MD   Thank you for choosing [facility] to provide your oncology and hematology care.  To afford each patient quality time with our providers, please arrive at least 15 minutes before your scheduled appointment time.  With your help, our goal is to use those 15 minutes to complete the necessary work-up to ensure our physicians have the information they need to help with your evaluation and healthcare recommendations.    Effective January 1st, 2014, we ask that you re-schedule your appointment with our physicians should you arrive 10 or more minutes late for your appointment.  We strive to give you quality time with our providers, and arriving late affects you and other patients whose appointments are after yours.    Again, thank you for choosing [facility].  Our hope is that these requests will decrease the amount of time that you wait before being seen by our physicians.       _____________________________________________________________  Should you have questions after your visit to [facility], please contact our office at [phone] between the hours of 8:30 a.m. and 5:00 p.m.  Voicemails left after 4:30 p.m. will not be returned until the following business day.  For prescription refill requests, have your pharmacy contact our office with your prescription refill request.

## 2013-04-30 NOTE — Progress Notes (Signed)
[name], MD [address] [place] [address]  MDS (myelodysplastic syndrome) with 5q- syndrome - Plan: CBC with Differential  CURRENT THERAPY: Aranesp 300 mcg every week  INTERVAL HISTORY: Lee Ramirez 79 y.o. 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  I personally reviewed and went over laboratory results with the patient.  His Hgb is down to 8.4 g/dL after a 2 unit PRBC transfusion on 04/22/2013.  I suspect he is beginning on his downward decline of Hgb.  Platelets are stable, but low at 109,000.  He fell again on Friday.  He did not report any injuries.  His daughter, who accompanied the patient, reports that Lee Ramirez energy improved on Sunday and then started to dwindle again on Tuesday.  Since Tuesday, he has been fatigued.  She reports that he has said to her, "I just want to die."    We had a long discussion regarding PRBC dependency.  We discussed the downfalls of continue PRBC transfusions including iron overload and development of antibodies.    At the conclusion of our visit today, we have decided to transfuse 2 units of PRBCs tomorrow, and then see the patient back in 1 week with labs.  If Hgb is down again, I think the family is agreeable to comfort care/Hospice.   Past Medical History  Diagnosis Date  . ASCVD (arteriosclerotic cardiovascular disease)     Sizable non-Q. myocardial infarction in 09/2009; severe three-vessel disease with an ejection fraction of 35%; three-vessel DES complicated by thrombocytopenia and mild renal insufficiency  . Hyperlipidemia     09/2010: TC-151, TG-411, H.-32, L.-not calculated   . Anemia     H/H -12.3/37.2; MCV-103; platelets-114 (08/2010)   . 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 Hypertension   . Orthostatic hypotension     Lightheaded; no syncope  . Thrombocytopenia, acquired     09/2010-114,000  . Hepatic steatosis 2013  . Thrombocytopenia     referred to hem-onc 5/13  . Squamous cell skin cancer 10/2012    of nose  . Myelodysplastic syndrome   . Myelodysplastic syndrome     has Arteriosclerotic cardiovascular disease (ASCVD)-ischemic cardiomyopathy; Thrombocytopenia, acquired; Hyperlipidemia; Hypertension; Orthostatic hypotension; Anemia, macrocytic; Insomnia; GERD (gastroesophageal reflux disease); MDS (myelodysplastic syndrome) with 5q- syndrome; Acute renal failure; Anemia; Cardiomyopathy; Weakness; Hypotension, unspecified; and Chronic systolic CHF (congestive heart failure) on his problem list.     is allergic to aspirin and revlimid.  Lee Ramirez does not currently have medications on file.  Past Surgical History  Procedure Laterality Date  . Inguinal hernia repair      Right  . Appendectomy    . Coronary angioplasty with stent placement    . Bone marrow biopsy  02/19/12  . Bone marrow aspiration  02/19/12  . Skin cancer excision  10/2012    Denies any headaches, dizziness, double vision, fevers, chills, night sweats, nausea, vomiting, diarrhea, constipation, chest pain, heart palpitations, shortness of breath, blood in stool, black tarry stool, urinary pain, urinary burning, urinary frequency, hematuria.   PHYSICAL EXAMINATION  ECOG PERFORMANCE STATUS: 3 - Symptomatic, >50% confined to bed  There were no vitals filed for this visit.  GENERAL:alert, no distress, well nourished, well developed, comfortable, cooperative, ill looking, pale and smiling SKIN: skin color, texture, turgor are  normal, no rashes or significant lesions HEAD: Normocephalic, No masses, lesions, tenderness or abnormalities EYES: normal, PERRLA, EOMI, Conjunctiva are pink and non-injected EARS: External ears normal OROPHARYNX:mucous membranes are moist  NECK: supple, no adenopathy, thyroid normal size, non-tender,  without nodularity, no stridor, non-tender, trachea midline LYMPH:  no palpable lymphadenopathy BREAST:not examined LUNGS: clear to auscultation and percussion HEART: regular rate & rhythm, no murmurs, no gallops, S1 normal and S2 normal ABDOMEN:abdomen soft, non-tender and normal bowel sounds BACK: Back symmetric, no curvature. EXTREMITIES:less then 2 second capillary refill, no joint deformities, effusion, or inflammation, no edema, no skin discoloration, no clubbing, no cyanosis  NEURO: alert & oriented x 3 with fluent speech, no focal motor/sensory deficits, in a wheelchair    LABORATORY DATA: CBC    Component Value Date/Time   WBC 7.2 04/30/2013 1345   RBC 2.65* 04/30/2013 1345   RBC 2.71* 01/07/2012 1630   HGB 8.4* 04/30/2013 1345   HCT 25.4* 04/30/2013 1345   PLT 109* 04/30/2013 1345   MCV 95.8 04/30/2013 1345   MCH 31.7 04/30/2013 1345   MCHC 33.1 04/30/2013 1345   RDW 18.1* 04/30/2013 1345   LYMPHSABS 3.2 04/30/2013 1345   MONOABS 0.5 04/30/2013 1345   EOSABS 0.2 04/30/2013 1345   BASOSABS 0.0 04/30/2013 1345       ASSESSMENT:  1. 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 2. Recurrent falls  Patient Active Problem List   Diagnosis Date Noted  . Hypotension, unspecified 04/06/2013  . Chronic systolic CHF (congestive heart failure) 04/06/2013  . Acute renal failure 02/27/2013  . Anemia 02/27/2013  . Cardiomyopathy 02/27/2013  . Weakness 02/27/2013  . MDS (myelodysplastic syndrome) with 5q- syndrome 03/19/2012  . GERD (gastroesophageal reflux disease) 11/24/2011  . Insomnia 11/15/2011  . Thrombocytopenia, acquired   . Hyperlipidemia   . Hypertension   . Orthostatic hypotension   . Anemia, macrocytic   . Arteriosclerotic cardiovascular disease (ASCVD)-ischemic cardiomyopathy 11/21/2009      PLAN:  1. I personally reviewed and went over laboratory results with the patient. 2. 2 units PRBCs  transfusion tomorrow 3. CBC diff in 1 week 4. Discussion regarding transfusion dependency 5. Discussion regarding comfort care 6. Aranesp today 7. Return in 1 week   THERAPY PLAN:  We will give the patient a 2 unit PRBC transfusion tomorrow.  Aranesp was given today.  In 1 week, if Hgb is back down to transfusion levels, will need to urge transition of care to Comfort Care/Hospice. I think the family and patient are willing to accept that if this transfusion is not beneficial.  Code status will need to be addressed as well.   All questions were answered. The patient knows to call the clinic with any problems, questions or concerns. We can certainly see the patient much sooner if necessary.  Patient and plan discussed with Dr. Erline Hau and he is in agreement with the aforementioned.   More than 50% of the time spent with the patient was utilized for counseling and coordination of care.   KEFALAS,THOMAS

## 2013-04-30 NOTE — Progress Notes (Unsigned)
Lee Ramirez presents today for injection per MD orders. Aranesp 300 administered SQ in right Abdomen. Administration without incident. Patient tolerated well.

## 2013-05-01 ENCOUNTER — Encounter (HOSPITAL_BASED_OUTPATIENT_CLINIC_OR_DEPARTMENT_OTHER): Payer: Medicare Other

## 2013-05-01 VITALS — BP 134/62 | HR 92 | Temp 97.7°F | Resp 18

## 2013-05-01 DIAGNOSIS — D469 Myelodysplastic syndrome, unspecified: Secondary | ICD-10-CM

## 2013-05-01 MED ORDER — DIPHENHYDRAMINE HCL 25 MG PO CAPS
ORAL_CAPSULE | ORAL | Status: AC
  Filled 2013-05-01: qty 1

## 2013-05-01 MED ORDER — SODIUM CHLORIDE 0.9 % IV SOLN
250.0000 mL | Freq: Once | INTRAVENOUS | Status: AC
  Administered 2013-05-01: 250 mL via INTRAVENOUS

## 2013-05-01 MED ORDER — FUROSEMIDE 10 MG/ML IJ SOLN: 20.0000 mg | Freq: Once | INTRAMUSCULAR | Status: AC

## 2013-05-01 MED ORDER — ACETAMINOPHEN 325 MG PO TABS
650.0000 mg | ORAL_TABLET | Freq: Once | ORAL | Status: AC
  Administered 2013-05-01: 650 mg via ORAL

## 2013-05-01 MED ORDER — ACETAMINOPHEN 325 MG PO TABS
ORAL_TABLET | ORAL | Status: AC
  Filled 2013-05-01: qty 2

## 2013-05-01 MED ORDER — DIPHENHYDRAMINE HCL 25 MG PO CAPS
25.0000 mg | ORAL_CAPSULE | Freq: Once | ORAL | Status: AC
  Administered 2013-05-01: 25 mg via ORAL

## 2013-05-01 MED ORDER — FUROSEMIDE 10 MG/ML IJ SOLN
20.0000 mg | Freq: Once | INTRAMUSCULAR | Status: AC
  Administered 2013-05-01: 20 mg via INTRAVENOUS

## 2013-05-01 MED ORDER — FUROSEMIDE 10 MG/ML IJ SOLN
INTRAMUSCULAR | Status: AC
  Filled 2013-05-01: qty 2

## 2013-05-01 NOTE — Progress Notes (Signed)
Tolerated blood transfusion without problems 

## 2013-05-02 LAB — TYPE AND SCREEN
ABO/RH(D): O POS
Unit division: 0

## 2013-05-07 ENCOUNTER — Encounter (HOSPITAL_COMMUNITY): Payer: Self-pay

## 2013-05-07 ENCOUNTER — Encounter (HOSPITAL_COMMUNITY): Payer: Medicare Other

## 2013-05-07 ENCOUNTER — Encounter (HOSPITAL_BASED_OUTPATIENT_CLINIC_OR_DEPARTMENT_OTHER): Payer: Medicare Other

## 2013-05-07 VITALS — BP 111/68 | HR 79 | Temp 97.5°F | Resp 16 | Wt 162.0 lb

## 2013-05-07 DIAGNOSIS — D469 Myelodysplastic syndrome, unspecified: Secondary | ICD-10-CM

## 2013-05-07 DIAGNOSIS — D696 Thrombocytopenia, unspecified: Secondary | ICD-10-CM

## 2013-05-07 DIAGNOSIS — D46C Myelodysplastic syndrome with isolated del(5q) chromosomal abnormality: Secondary | ICD-10-CM

## 2013-05-07 DIAGNOSIS — D649 Anemia, unspecified: Secondary | ICD-10-CM

## 2013-05-07 LAB — CBC WITH DIFFERENTIAL/PLATELET
Basophils Relative: 1 % (ref 0–1)
Eosinophils Relative: 4 % (ref 0–5)
Hemoglobin: 10.2 g/dL — ABNORMAL LOW (ref 13.0–17.0)
Lymphocytes Relative: 41 % (ref 12–46)
MCH: 31 pg (ref 26.0–34.0)
Monocytes Absolute: 0.3 10*3/uL (ref 0.1–1.0)
Monocytes Relative: 6 % (ref 3–12)
Neutrophils Relative %: 48 % (ref 43–77)
Platelets: 101 10*3/uL — ABNORMAL LOW (ref 150–400)
RBC: 3.29 MIL/uL — ABNORMAL LOW (ref 4.22–5.81)
WBC: 5.8 10*3/uL (ref 4.0–10.5)

## 2013-05-07 MED ORDER — DARBEPOETIN ALFA-POLYSORBATE 300 MCG/0.6ML IJ SOLN
INTRAMUSCULAR | Status: AC
  Filled 2013-05-07: qty 0.6

## 2013-05-07 MED ORDER — DARBEPOETIN ALFA-POLYSORBATE 300 MCG/0.6ML IJ SOLN
300.0000 ug | Freq: Once | INTRAMUSCULAR | Status: AC
  Administered 2013-05-07: 300 ug via SUBCUTANEOUS

## 2013-05-07 NOTE — Progress Notes (Signed)
[facility] OFFICE PROGRESS NOTE  [name], MD [address] [place] [address]  DIAGNOSIS: MDS (myelodysplastic syndrome) with 5q- syndrome - Plan: CBC with Differential  Thrombocytopenia, acquired  Anemia  Chief Complaint  Patient presents with  . Anemia    CURRENT THERAPY: Red blood cell support with weekly iron aspirin plus blood transfusion, 2 units of red cells given last on 05/01/2013  INTERVAL HISTORY: Lee [facility] 79 y.o. male returns for followup of mild dysplastic syndrome, 5 q-, status post treatment with Revlimid after which he developed severe fatigue. After one month treatment, reinstitution of therapy resulted in diffuse hives. Since then patient he has received no active therapy for his MDS other than iron as and blood transfusions. At first she was receiving transfusions monthly and then every 2 weeks and now he is being seen every week to make sure that his anemia is not progressively worsening. He is fatigued. He denies a lower family swelling, epistaxis, or worsening bruising. He also denies melena, hematochezia, nausea, vomiting, abdominal pain, diarrhea, or confusion.Marland Kitchen   MEDICAL HISTORY: Past Medical History  Diagnosis Date  . ASCVD (arteriosclerotic cardiovascular disease)     Sizable non-Q. myocardial infarction in 09/2009; severe three-vessel disease with an ejection fraction of 35%; three-vessel DES complicated by thrombocytopenia and mild renal insufficiency  . Hyperlipidemia     09/2010: TC-151, TG-411, H.-32, L.-not calculated   . Anemia     H/H -12.3/37.2; MCV-103; platelets-114 (08/2010)   . 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 Hypertension   . Orthostatic hypotension     Lightheaded; no syncope  . Thrombocytopenia, acquired     09/2010-114,000  . Hepatic steatosis 2013  . Thrombocytopenia     referred to hem-onc 5/13  . Squamous cell skin cancer  10/2012    of nose  . Myelodysplastic syndrome   . Myelodysplastic syndrome     INTERIM HISTORY: has Arteriosclerotic cardiovascular disease (ASCVD)-ischemic cardiomyopathy; Thrombocytopenia, acquired; Hyperlipidemia; Hypertension; Orthostatic hypotension; Anemia, macrocytic; Insomnia; GERD (gastroesophageal reflux disease); MDS (myelodysplastic syndrome) with 5q- syndrome; Acute renal failure; Anemia; Cardiomyopathy; Weakness; Hypotension, unspecified; and Chronic systolic CHF (congestive heart failure) on his problem list.    ALLERGIES:  is allergic to aspirin and revlimid.  MEDICATIONS: has a current medication list which includes the following prescription(s): acetaminophen, albuterol, allopurinol, carvedilol, cranberry, meclizine, multiple vitamins-iron, nitroglycerin, omeprazole, pravastatin, travoprost (bak free), vitamin b-12, and zolpidem.  SURGICAL HISTORY:  Past Surgical History  Procedure Laterality Date  . Inguinal hernia repair      Right  . Appendectomy    . Coronary angioplasty with stent placement    . Bone marrow biopsy  02/19/12  . Bone marrow aspiration  02/19/12  . Skin cancer excision  10/2012    REVIEW OF SYSTEMS:   Constitutional: Denies fevers, chills or abnormal weight loss. Severe fatigue Eyes: Denies blurriness of vision Ears, nose, mouth, throat, and face: Denies mucositis or sore throat Respiratory: Denies cough, dyspnea or wheezes Cardiovascular: Denies palpitation, chest discomfort or lower extremity swelling Gastrointestinal:  Denies nausea, heartburn or change in bowel habits Skin: Denies abnormal skin rashes. Easy bruising. Lymphatics: Denies new lymphadenopathy or easy bruising Neurological:Denies numbness, tingling or new weaknesses Behavioral/Psych: Mood is stable, no new changes  All other systems were reviewed with the patient and are negative.  PHYSICAL EXAMINATION: ECOG PERFORMANCE STATUS: 3 - Symptomatic, >50% confined to bed  Blood  pressure 111/68, pulse 79, temperature 97.5 F (36.4 C), temperature source Oral, resp. rate 16, weight 162 lb (73.483 kg).  GENERAL:alert, no distress and comfortable SKIN: skin color, texture, turgor are normal, no rashes or significant lesions EYES: normal, Conjunctiva are pink and non-injected, sclera clear OROPHARYNX:no exudate, no erythema and lips, buccal mucosa, and tongue normal  NECK: supple, thyroid normal size, non-tender, without nodularity LYMPH:  no palpable lymphadenopathy in the cervical, axillary or inguinal LUNGS: clear to auscultation and percussion with normal breathing effort HEART: regular rate & rhythm and no murmurs and no lower extremity edema ABDOMEN:abdomen soft, non-tender and normal bowel sounds Musculoskeletal:no cyanosis of digits and no clubbing  NEURO: alert & oriented x 3 with fluent speech, no focal motor/sensory deficits   LABORATORY DATA: 05/07/2013:20 BC 5.8, hemoglobin 10.2. Office Visit on 04/30/2013  Component Date Value Range Status  . WBC 04/30/2013 7.2  4.0 - 10.5 K/uL Final  . RBC 04/30/2013 2.65* 4.22 - 5.81 MIL/uL Final  . Hemoglobin 04/30/2013 8.4* 13.0 - 17.0 g/dL Final  . HCT 16/05/9603 25.4* 39.0 - 52.0 % Final  . MCV 04/30/2013 95.8  78.0 - 100.0 fL Final  . MCH 04/30/2013 31.7  26.0 - 34.0 pg Final  . MCHC 04/30/2013 33.1  30.0 - 36.0 g/dL Final  . RDW 54/04/8118 18.1* 11.5 - 15.5 % Final  . Platelets 04/30/2013 109* 150 - 400 K/uL Final  . Neutrophils Relative % 04/30/2013 45  43 - 77 % Final  . Neutro Abs 04/30/2013 3.3  1.7 - 7.7 K/uL Final  . Lymphocytes Relative 04/30/2013 45  12 - 46 % Final  . Lymphs Abs 04/30/2013 3.2  0.7 - 4.0 K/uL Final  . Monocytes Relative 04/30/2013 7  3 - 12 % Final  . Monocytes Absolute 04/30/2013 0.5  0.1 - 1.0 K/uL Final  . Eosinophils Relative 04/30/2013 2  0 - 5 % Final  . Eosinophils Absolute 04/30/2013 0.2  0.0 - 0.7 K/uL Final  . Basophils Relative 04/30/2013 0  0 - 1 % Final  .  Basophils Absolute 04/30/2013 0.0  0.0 - 0.1 K/uL Final  Infusion on 04/30/2013  Component Date Value Range Status  . Order Confirmation 04/30/2013 ORDER PROCESSED BY BLOOD BANK   Final  Appointment on 04/30/2013  Component Date Value Range Status  . ABO/RH(D) 04/30/2013 O POS   Final  . Antibody Screen 04/30/2013 NEG   Final  . Sample Expiration 04/30/2013 05/03/2013   Final  . Unit Number 04/30/2013 [id number]   Final  . Blood Component Type 04/30/2013 RBC, LR IRR   Final  . Unit division 04/30/2013 00   Final  . Status of Unit 04/30/2013 ISSUED,FINAL   Final  . Transfusion Status 04/30/2013 OK TO TRANSFUSE   Final  . Crossmatch Result 04/30/2013 Compatible   Final  . Unit Number 04/30/2013 [id number]   Final  . Blood Component Type 04/30/2013 RBC, LR IRR   Final  . Unit division 04/30/2013 00   Final  . Status of Unit 04/30/2013 ISSUED,FINAL   Final  . Transfusion Status 04/30/2013 OK TO TRANSFUSE   Final  . Crossmatch Result 04/30/2013 Compatible   Final  Infusion on 04/22/2013  Component Date Value Range Status  . ABO/RH(D) 04/22/2013 O POS   Final  . Antibody Screen 04/22/2013 NEG   Final  . Sample Expiration 04/22/2013 04/25/2013   Final  . Unit Number 04/22/2013 [id number]   Final  . Blood Component Type 04/22/2013 RBC, LR IRR  Final  . Unit division 04/22/2013 00   Final  . Status of Unit 04/22/2013 ISSUED,FINAL   Final  . Transfusion Status 04/22/2013 OK TO TRANSFUSE   Final  . Crossmatch Result 04/22/2013 Compatible   Final  . Unit Number 04/22/2013 [id number]   Final  . Blood Component Type 04/22/2013 RBC, LR IRR   Final  . Unit division 04/22/2013 00   Final  . Status of Unit 04/22/2013 ISSUED,FINAL   Final  . Transfusion Status 04/22/2013 OK TO TRANSFUSE   Final  . Crossmatch Result 04/22/2013 Compatible   Final  Infusion on 04/22/2013  Component Date Value Range Status  . WBC 04/22/2013 5.2  4.0 - 10.5 K/uL Final  . RBC 04/22/2013 2.40* 4.22 - 5.81  MIL/uL Final  . Hemoglobin 04/22/2013 7.5* 13.0 - 17.0 g/dL Final  . HCT 11/91/4782 23.0* 39.0 - 52.0 % Final  . MCV 04/22/2013 95.8  78.0 - 100.0 fL Final  . MCH 04/22/2013 31.3  26.0 - 34.0 pg Final  . MCHC 04/22/2013 32.6  30.0 - 36.0 g/dL Final  . RDW 95/62/1308 19.3* 11.5 - 15.5 % Final  . Platelets 04/22/2013 106* 150 - 400 K/uL Final   Comment: SPECIMEN CHECKED FOR CLOTS                          PLATELET COUNT CONFIRMED BY SMEAR  . Neutrophils Relative % 04/22/2013 41* 43 - 77 % Final  . Neutro Abs 04/22/2013 2.1  1.7 - 7.7 K/uL Final  . Lymphocytes Relative 04/22/2013 47* 12 - 46 % Final  . Lymphs Abs 04/22/2013 2.4  0.7 - 4.0 K/uL Final  . Monocytes Relative 04/22/2013 7  3 - 12 % Final  . Monocytes Absolute 04/22/2013 0.4  0.1 - 1.0 K/uL Final  . Eosinophils Relative 04/22/2013 5  0 - 5 % Final  . Eosinophils Absolute 04/22/2013 0.2  0.0 - 0.7 K/uL Final  . Basophils Relative 04/22/2013 1  0 - 1 % Final  . Basophils Absolute 04/22/2013 0.0  0.0 - 0.1 K/uL Final  . Smear Review 04/22/2013 PLATELETS APPEAR DECREASED   Final  . Order Confirmation 04/22/2013 ORDER PROCESSED BY BLOOD BANK   Final  Office Visit on 04/15/2013  Component Date Value Range Status  . WBC 04/15/2013 7.4  4.0 - 10.5 K/uL Final  . RBC 04/15/2013 2.70* 4.22 - 5.81 MIL/uL Final  . Hemoglobin 04/15/2013 8.6* 13.0 - 17.0 g/dL Final  . HCT 65/78/4696 26.1* 39.0 - 52.0 % Final  . MCV 04/15/2013 96.7  78.0 - 100.0 fL Final  . MCH 04/15/2013 31.9  26.0 - 34.0 pg Final  . MCHC 04/15/2013 33.0  30.0 - 36.0 g/dL Final  . RDW 29/52/8413 19.1* 11.5 - 15.5 % Final  . Platelets 04/15/2013 127* 150 - 400 K/uL Final  . Neutrophils Relative % 04/15/2013 42* 43 - 77 % Final  . Neutro Abs 04/15/2013 3.1  1.7 - 7.7 K/uL Final  . Lymphocytes Relative 04/15/2013 49* 12 - 46 % Final  . Lymphs Abs 04/15/2013 3.6  0.7 - 4.0 K/uL Final  . Monocytes Relative 04/15/2013 5  3 - 12 % Final  . Monocytes Absolute 04/15/2013 0.4   0.1 - 1.0 K/uL Final  . Eosinophils Relative 04/15/2013 4  0 - 5 % Final  . Eosinophils Absolute 04/15/2013 0.3  0.0 - 0.7 K/uL Final  . Basophils Relative 04/15/2013 1  0 - 1 % Final  .  Basophils Absolute 04/15/2013 0.1  0.0 - 0.1 K/uL Final  . Sodium 04/15/2013 137  135 - 145 mEq/L Final  . Potassium 04/15/2013 4.6  3.5 - 5.1 mEq/L Final  . Chloride 04/15/2013 102  96 - 112 mEq/L Final  . CO2 04/15/2013 24  19 - 32 mEq/L Final  . Glucose, Bld 04/15/2013 133* 70 - 99 mg/dL Final  . BUN 19/14/7829 24* 6 - 23 mg/dL Final  . Creatinine, Ser 04/15/2013 0.93  0.50 - 1.35 mg/dL Final  . Calcium 56/21/3086 9.1  8.4 - 10.5 mg/dL Final  . Total Protein 04/15/2013 7.1  6.0 - 8.3 g/dL Final  . Albumin 57/84/6962 2.4* 3.5 - 5.2 g/dL Final  . AST 95/28/4132 34  0 - 37 U/L Final  . ALT 04/15/2013 53  0 - 53 U/L Final  . Alkaline Phosphatase 04/15/2013 408* 39 - 117 U/L Final  . Total Bilirubin 04/15/2013 0.4  0.3 - 1.2 mg/dL Final  . GFR calc non Af Amer 04/15/2013 73* >90 mL/min Final  . GFR calc Af Amer 04/15/2013 84* >90 mL/min Final   Comment: (NOTE)                          The eGFR has been calculated using the CKD EPI equation.                          This calculation has not been validated in all clinical situations.                          eGFR's persistently <90 mL/min signify possible Chronic Kidney                          Disease.     Urinalysis    Component Value Date/Time   COLORURINE YELLOW 02/27/2013 1051   APPEARANCEUR CLEAR 02/27/2013 1051   LABSPEC 1.025 02/27/2013 1051   PHURINE 5.5 02/27/2013 1051   GLUCOSEU NEGATIVE 02/27/2013 1051   HGBUR NEGATIVE 02/27/2013 1051   BILIRUBINUR NEGATIVE 02/27/2013 1051   KETONESUR TRACE* 02/27/2013 1051   PROTEINUR TRACE* 02/27/2013 1051   UROBILINOGEN 0.2 02/27/2013 1051   NITRITE NEGATIVE 02/27/2013 1051   LEUKOCYTESUR NEGATIVE 02/27/2013 1051    RADIOGRAPHIC STUDIES: No results found.  ASSESSMENT: PLAN: #1. Myelodysplastic  syndrome, 5q-, transfusion dependent, status post 2 units of packed red blood cells on 05/01/2013, status post iron aspirin 300 mcg subcutaneously one week ago.  #2. Revlimid allergy. #3. With increasing transfusion requirements up to once a week, patient may be transitioning into acute leukemia. #4. Patient was given iron aspirin 300 mcg subcutaneously and will be seen again in one week. He was told to call should he become much more severe or should he develop any bleeding or fever.   All questions were answered. The patient knows to call the clinic with any problems, questions or concerns. We can certainly see the patient much sooner if necessary.  The patient and plan discussed with Alla German A and he is in agreement with the aforementioned.  I spent 30 minutes counseling the patient face to face. The total time spent in the appointment was 25 minutes.    Maurilio Lovely, MD 05/07/2013 2:36 PM

## 2013-05-07 NOTE — Patient Instructions (Addendum)
[name] Discharge Instructions  RECOMMENDATIONS MADE BY THE CONSULTANT AND ANY TEST RESULTS WILL BE SENT TO YOUR REFERRING PHYSICIAN.  EXAM FINDINGS BY THE PHYSICIAN TODAY AND SIGNS OR SYMPTOMS TO REPORT TO CLINIC OR PRIMARY PHYSICIAN: Exam and findings as discussed by Dr. Zigmund Daniel.  Your hemoglobin is 10.2 so we will continue the weekly Aranesp.  Report increased fatigue or shortness of breath.  MEDICATIONS PRESCRIBED:  none  INSTRUCTIONS/FOLLOW-UP: Follow-up in 1 week.  Thank you for choosing [facility] to provide your oncology and hematology care.  To afford each patient quality time with our providers, please arrive at least 15 minutes before your scheduled appointment time.  With your help, our goal is to use those 15 minutes to complete the necessary work-up to ensure our physicians have the information they need to help with your evaluation and healthcare recommendations.    Effective January 1st, 2014, we ask that you re-schedule your appointment with our physicians should you arrive 10 or more minutes late for your appointment.  We strive to give you quality time with our providers, and arriving late affects you and other patients whose appointments are after yours.    Again, thank you for choosing [name].  Our hope is that these requests will decrease the amount of time that you wait before being seen by our physicians.       _____________________________________________________________  Should you have questions after your visit to [facility], please contact our office at [phone] between the hours of 8:30 a.m. and 5:00 p.m.  Voicemails left after 4:30 p.m. will not be returned until the following business day.  For prescription refill requests, have your pharmacy contact our office with your prescription refill request.

## 2013-05-07 NOTE — Progress Notes (Signed)
Lee Ramirez presents today for injection per MD orders. Aranesp 300 mcg administered SQ in right Abdomen. Administration without incident. Patient tolerated well.  

## 2013-05-14 ENCOUNTER — Encounter: Payer: Self-pay | Admitting: Cardiology

## 2013-05-14 ENCOUNTER — Encounter (HOSPITAL_COMMUNITY): Payer: Medicare Other

## 2013-05-14 ENCOUNTER — Encounter (HOSPITAL_COMMUNITY): Payer: Self-pay

## 2013-05-14 ENCOUNTER — Ambulatory Visit (INDEPENDENT_AMBULATORY_CARE_PROVIDER_SITE_OTHER): Payer: Medicare Other | Admitting: Cardiology

## 2013-05-14 ENCOUNTER — Encounter (HOSPITAL_BASED_OUTPATIENT_CLINIC_OR_DEPARTMENT_OTHER): Payer: Medicare Other

## 2013-05-14 VITALS — Temp 96.8°F | Resp 16

## 2013-05-14 VITALS — BP 100/65 | HR 94 | Ht 74.0 in | Wt 161.0 lb

## 2013-05-14 DIAGNOSIS — D46C Myelodysplastic syndrome with isolated del(5q) chromosomal abnormality: Secondary | ICD-10-CM

## 2013-05-14 DIAGNOSIS — D469 Myelodysplastic syndrome, unspecified: Secondary | ICD-10-CM

## 2013-05-14 DIAGNOSIS — I25810 Atherosclerosis of coronary artery bypass graft(s) without angina pectoris: Secondary | ICD-10-CM

## 2013-05-14 DIAGNOSIS — I5022 Chronic systolic (congestive) heart failure: Secondary | ICD-10-CM

## 2013-05-14 DIAGNOSIS — D539 Nutritional anemia, unspecified: Secondary | ICD-10-CM

## 2013-05-14 DIAGNOSIS — E785 Hyperlipidemia, unspecified: Secondary | ICD-10-CM

## 2013-05-14 LAB — CBC WITH DIFFERENTIAL/PLATELET
Basophils Absolute: 0.1 10*3/uL (ref 0.0–0.1)
Basophils Relative: 1 % (ref 0–1)
Eosinophils Absolute: 0.3 10*3/uL (ref 0.0–0.7)
Hemoglobin: 9.2 g/dL — ABNORMAL LOW (ref 13.0–17.0)
Lymphocytes Relative: 48 % — ABNORMAL HIGH (ref 12–46)
MCHC: 33 g/dL (ref 30.0–36.0)
Monocytes Relative: 6 % (ref 3–12)
Neutrophils Relative %: 41 % — ABNORMAL LOW (ref 43–77)
RDW: 18.1 % — ABNORMAL HIGH (ref 11.5–15.5)
WBC Morphology: INCREASED
WBC: 6.4 10*3/uL (ref 4.0–10.5)

## 2013-05-14 MED ORDER — DARBEPOETIN ALFA-POLYSORBATE 300 MCG/0.6ML IJ SOLN
INTRAMUSCULAR | Status: AC
  Filled 2013-05-14: qty 0.6

## 2013-05-14 MED ORDER — DARBEPOETIN ALFA-POLYSORBATE 300 MCG/0.6ML IJ SOLN
300.0000 ug | INTRAMUSCULAR | Status: DC
  Administered 2013-05-14: 300 ug via SUBCUTANEOUS

## 2013-05-14 NOTE — Progress Notes (Signed)
[facility] OFFICE PROGRESS NOTE  [name], MD [address] [place] [address]  DIAGNOSIS: MDS (myelodysplastic syndrome) with 5q- syndrome  Anemia, macrocytic  Chronic systolic CHF (congestive heart failure)  Chief Complaint  Patient presents with  . Anemia    CURRENT THERAPY: Aranesp and periodic red blood cell transfusions.  INTERVAL HISTORY: Lee Ramirez 79 y.o. male   returns for followup of mild dysplastic syndrome, 5 q-, status post treatment with Revlimid after which he developed severe fatigue. After one month treatment, reinstitution of therapy resulted in diffuse hives. He has felt well during the past week with good appetite and no nausea or vomiting. Movements are regular with occasional urinary hesitancy he denies any cough, PND, orthopnea, palpitations, fever, or night sweats.   MEDICAL HISTORY: Past Medical History  Diagnosis Date  . ASCVD (arteriosclerotic cardiovascular disease)     Sizable non-Q. myocardial infarction in 09/2009; severe three-vessel disease with an ejection fraction of 35%; three-vessel DES complicated by thrombocytopenia and mild renal insufficiency  . Hyperlipidemia     09/2010: TC-151, TG-411, H.-32, L.-not calculated   . Anemia     H/H -12.3/37.2; MCV-103; platelets-114 (08/2010)   . 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 Hypertension   . Orthostatic hypotension     Lightheaded; no syncope  . Thrombocytopenia, acquired     09/2010-114,000  . Hepatic steatosis 2013  . Thrombocytopenia     referred to hem-onc 5/13  . Squamous cell skin cancer 10/2012    of nose  . Myelodysplastic syndrome   . Myelodysplastic syndrome     INTERIM HISTORY: has Arteriosclerotic cardiovascular disease (ASCVD)-ischemic cardiomyopathy; Thrombocytopenia, acquired; Hyperlipidemia; Hypertension; Orthostatic hypotension; Anemia, macrocytic; Insomnia; GERD  (gastroesophageal reflux disease); MDS (myelodysplastic syndrome) with 5q- syndrome; Acute renal failure; Anemia; Cardiomyopathy; Weakness; Hypotension, unspecified; and Chronic systolic CHF (congestive heart failure) on his problem list.    ALLERGIES:  is allergic to aspirin and revlimid.  MEDICATIONS: has a current medication list which includes the following prescription(s): acetaminophen, albuterol, allopurinol, carvedilol, cranberry, meclizine, multiple vitamins-iron, nitroglycerin, omeprazole, pravastatin, travoprost (bak free), vitamin b-12, and zolpidem, and the following Facility-Administered Medications: darbepoetin.  SURGICAL HISTORY:  Past Surgical History  Procedure Laterality Date  . Inguinal hernia repair      Right  . Appendectomy    . Coronary angioplasty with stent placement    . Bone marrow biopsy  02/19/12  . Bone marrow aspiration  02/19/12  . Skin cancer excision  10/2012    FAMILY HISTORY: family history is not on file.  SOCIAL HISTORY:  reports that he has never smoked. He has never used smokeless tobacco. He reports that he does not drink alcohol or use illicit drugs.  REVIEW OF SYSTEMS:  Other than that discussed above is noncontributory.  PHYSICAL EXAMINATION: ECOG PERFORMANCE STATUS: 2 - Symptomatic, <50% confined to bed  Temperature 96.8 F (36 C), temperature source Oral, resp. rate 16.  GENERAL:alert, no distress and comfortable. In good spirits answering appropriately. SKIN: skin color, texture, turgor are normal, no rashes or significant lesions EYES: normal, Conjunctiva are pink and non-injected, sclera clear OROPHARYNX:no exudate, no erythema and lips, buccal mucosa, and tongue normal  NECK: supple, thyroid normal size, non-tender, without nodularity CHEST: Normal AP diameter without gynecomastia. LYMPH:  no palpable lymphadenopathy in the cervical, axillary or inguinal LUNGS: clear to auscultation and percussion with normal breathing effort HEART:  regular rate &  rhythm and no murmurs and no lower extremity edema ABDOMEN:abdomen soft, non-tender and normal bowel sounds Musculoskeletal:no cyanosis of digits and no clubbing  NEURO: alert & oriented x 3 with fluent speech, no focal motor/sensory deficits   LABORATORY DATA: Appointment on 05/14/2013  Component Date Value Range Status  . WBC 05/14/2013 6.4  4.0 - 10.5 K/uL Final  . RBC 05/14/2013 2.92* 4.22 - 5.81 MIL/uL Final  . Hemoglobin 05/14/2013 9.2* 13.0 - 17.0 g/dL Final  . HCT 16/05/9603 27.9* 39.0 - 52.0 % Final  . MCV 05/14/2013 95.5  78.0 - 100.0 fL Final  . MCH 05/14/2013 31.5  26.0 - 34.0 pg Final  . MCHC 05/14/2013 33.0  30.0 - 36.0 g/dL Final  . RDW 54/04/8118 18.1* 11.5 - 15.5 % Final  . Platelets 05/14/2013 PENDING  150 - 400 K/uL Incomplete  . Neutrophils Relative % 05/14/2013 PENDING  43 - 77 % Incomplete  . Neutro Abs 05/14/2013 PENDING  1.7 - 7.7 K/uL Incomplete  . Band Neutrophils 05/14/2013 PENDING  0 - 10 % Incomplete  . Lymphocytes Relative 05/14/2013 PENDING  12 - 46 % Incomplete  . Lymphs Abs 05/14/2013 PENDING  0.7 - 4.0 K/uL Incomplete  . Monocytes Relative 05/14/2013 PENDING  3 - 12 % Incomplete  . Monocytes Absolute 05/14/2013 PENDING  0.1 - 1.0 K/uL Incomplete  . Eosinophils Relative 05/14/2013 PENDING  0 - 5 % Incomplete  . Eosinophils Absolute 05/14/2013 PENDING  0.0 - 0.7 K/uL Incomplete  . Basophils Relative 05/14/2013 PENDING  0 - 1 % Incomplete  . Basophils Absolute 05/14/2013 PENDING  0.0 - 0.1 K/uL Incomplete  . WBC Morphology 05/14/2013 PENDING   Incomplete  . RBC Morphology 05/14/2013 PENDING   Incomplete  . Smear Review 05/14/2013 PENDING   Incomplete  . nRBC 05/14/2013 PENDING  0 /100 WBC Incomplete  . Metamyelocytes Relative 05/14/2013 PENDING   Incomplete  . Myelocytes 05/14/2013 PENDING   Incomplete  . Promyelocytes Absolute 05/14/2013 PENDING   Incomplete  . Blasts 05/14/2013 PENDING   Incomplete  Office Visit on 05/07/2013   Component Date Value Range Status  . WBC 05/07/2013 5.8  4.0 - 10.5 K/uL Final  . RBC 05/07/2013 3.29* 4.22 - 5.81 MIL/uL Final  . Hemoglobin 05/07/2013 10.2* 13.0 - 17.0 g/dL Final  . HCT 14/78/2956 31.4* 39.0 - 52.0 % Final  . MCV 05/07/2013 95.4  78.0 - 100.0 fL Final  . MCH 05/07/2013 31.0  26.0 - 34.0 pg Final  . MCHC 05/07/2013 32.5  30.0 - 36.0 g/dL Final  . RDW 21/30/8657 17.5* 11.5 - 15.5 % Final  . Platelets 05/07/2013 101* 150 - 400 K/uL Final  . Neutrophils Relative % 05/07/2013 48  43 - 77 % Final  . Lymphocytes Relative 05/07/2013 41  12 - 46 % Final  . Monocytes Relative 05/07/2013 6  3 - 12 % Final  . Eosinophils Relative 05/07/2013 4  0 - 5 % Final  . Basophils Relative 05/07/2013 1  0 - 1 % Final  . Neutro Abs 05/07/2013 2.8  1.7 - 7.7 K/uL Final  . Lymphs Abs 05/07/2013 2.4  0.7 - 4.0 K/uL Final  . Monocytes Absolute 05/07/2013 0.3  0.1 - 1.0 K/uL Final  . Eosinophils Absolute 05/07/2013 0.2  0.0 - 0.7 K/uL Final  . Basophils Absolute 05/07/2013 0.1  0.0 - 0.1 K/uL Final  . WBC Morphology 05/07/2013 ATYPICAL LYMPHOCYTES   Final   Comment: ATYPICAL MONONUCLEAR CELLS  INCREASED BANDS (>20% BANDS)  . Smear Review 05/07/2013 PLATELET COUNT CONFIRMED BY SMEAR   Final   Comment: PLATELETS APPEAR DECREASED                          LARGE PLATELETS PRESENT                          GIANT PLATELETS SEEN  Office Visit on 04/30/2013  Component Date Value Range Status  . WBC 04/30/2013 7.2  4.0 - 10.5 K/uL Final  . RBC 04/30/2013 2.65* 4.22 - 5.81 MIL/uL Final  . Hemoglobin 04/30/2013 8.4* 13.0 - 17.0 g/dL Final  . HCT 40/98/1191 25.4* 39.0 - 52.0 % Final  . MCV 04/30/2013 95.8  78.0 - 100.0 fL Final  . MCH 04/30/2013 31.7  26.0 - 34.0 pg Final  . MCHC 04/30/2013 33.1  30.0 - 36.0 g/dL Final  . RDW 47/82/9562 18.1* 11.5 - 15.5 % Final  . Platelets 04/30/2013 109* 150 - 400 K/uL Final  . Neutrophils Relative % 04/30/2013 45  43 - 77 % Final  .  Neutro Abs 04/30/2013 3.3  1.7 - 7.7 K/uL Final  . Lymphocytes Relative 04/30/2013 45  12 - 46 % Final  . Lymphs Abs 04/30/2013 3.2  0.7 - 4.0 K/uL Final  . Monocytes Relative 04/30/2013 7  3 - 12 % Final  . Monocytes Absolute 04/30/2013 0.5  0.1 - 1.0 K/uL Final  . Eosinophils Relative 04/30/2013 2  0 - 5 % Final  . Eosinophils Absolute 04/30/2013 0.2  0.0 - 0.7 K/uL Final  . Basophils Relative 04/30/2013 0  0 - 1 % Final  . Basophils Absolute 04/30/2013 0.0  0.0 - 0.1 K/uL Final  Infusion on 04/30/2013  Component Date Value Range Status  . Order Confirmation 04/30/2013 ORDER PROCESSED BY BLOOD BANK   Final  Appointment on 04/30/2013  Component Date Value Range Status  . ABO/RH(D) 04/30/2013 O POS   Final  . Antibody Screen 04/30/2013 NEG   Final  . Sample Expiration 04/30/2013 05/03/2013   Final  . Unit Number 04/30/2013 [id number]   Final  . Blood Component Type 04/30/2013 RBC, LR IRR   Final  . Unit division 04/30/2013 00   Final  . Status of Unit 04/30/2013 ISSUED,FINAL   Final  . Transfusion Status 04/30/2013 OK TO TRANSFUSE   Final  . Crossmatch Result 04/30/2013 Compatible   Final  . Unit Number 04/30/2013 [id number]   Final  . Blood Component Type 04/30/2013 RBC, LR IRR   Final  . Unit division 04/30/2013 00   Final  . Status of Unit 04/30/2013 ISSUED,FINAL   Final  . Transfusion Status 04/30/2013 OK TO TRANSFUSE   Final  . Crossmatch Result 04/30/2013 Compatible   Final  Infusion on 04/22/2013  Component Date Value Range Status  . ABO/RH(D) 04/22/2013 O POS   Final  . Antibody Screen 04/22/2013 NEG   Final  . Sample Expiration 04/22/2013 04/25/2013   Final  . Unit Number 04/22/2013 [id number]   Final  . Blood Component Type 04/22/2013 RBC, LR IRR   Final  . Unit division 04/22/2013 00   Final  . Status of Unit 04/22/2013 ISSUED,FINAL   Final  . Transfusion Status 04/22/2013 OK TO TRANSFUSE   Final  . Crossmatch Result 04/22/2013 Compatible   Final  . Unit  Number 04/22/2013 [id number]   Final  . Blood Component Type 04/22/2013 RBC,  LR IRR   Final  . Unit division 04/22/2013 00   Final  . Status of Unit 04/22/2013 ISSUED,FINAL   Final  . Transfusion Status 04/22/2013 OK TO TRANSFUSE   Final  . Crossmatch Result 04/22/2013 Compatible   Final  Infusion on 04/22/2013  Component Date Value Range Status  . WBC 04/22/2013 5.2  4.0 - 10.5 K/uL Final  . RBC 04/22/2013 2.40* 4.22 - 5.81 MIL/uL Final  . Hemoglobin 04/22/2013 7.5* 13.0 - 17.0 g/dL Final  . HCT 16/05/9603 23.0* 39.0 - 52.0 % Final  . MCV 04/22/2013 95.8  78.0 - 100.0 fL Final  . MCH 04/22/2013 31.3  26.0 - 34.0 pg Final  . MCHC 04/22/2013 32.6  30.0 - 36.0 g/dL Final  . RDW 54/04/8118 19.3* 11.5 - 15.5 % Final  . Platelets 04/22/2013 106* 150 - 400 K/uL Final   Comment: SPECIMEN CHECKED FOR CLOTS                          PLATELET COUNT CONFIRMED BY SMEAR  . Neutrophils Relative % 04/22/2013 41* 43 - 77 % Final  . Neutro Abs 04/22/2013 2.1  1.7 - 7.7 K/uL Final  . Lymphocytes Relative 04/22/2013 47* 12 - 46 % Final  . Lymphs Abs 04/22/2013 2.4  0.7 - 4.0 K/uL Final  . Monocytes Relative 04/22/2013 7  3 - 12 % Final  . Monocytes Absolute 04/22/2013 0.4  0.1 - 1.0 K/uL Final  . Eosinophils Relative 04/22/2013 5  0 - 5 % Final  . Eosinophils Absolute 04/22/2013 0.2  0.0 - 0.7 K/uL Final  . Basophils Relative 04/22/2013 1  0 - 1 % Final  . Basophils Absolute 04/22/2013 0.0  0.0 - 0.1 K/uL Final  . Smear Review 04/22/2013 PLATELETS APPEAR DECREASED   Final  . Order Confirmation 04/22/2013 ORDER PROCESSED BY BLOOD BANK   Final  Office Visit on 04/15/2013  Component Date Value Range Status  . WBC 04/15/2013 7.4  4.0 - 10.5 K/uL Final  . RBC 04/15/2013 2.70* 4.22 - 5.81 MIL/uL Final  . Hemoglobin 04/15/2013 8.6* 13.0 - 17.0 g/dL Final  . HCT 14/78/2956 26.1* 39.0 - 52.0 % Final  . MCV 04/15/2013 96.7  78.0 - 100.0 fL Final  . MCH 04/15/2013 31.9  26.0 - 34.0 pg Final  . MCHC  04/15/2013 33.0  30.0 - 36.0 g/dL Final  . RDW 21/30/8657 19.1* 11.5 - 15.5 % Final  . Platelets 04/15/2013 127* 150 - 400 K/uL Final  . Neutrophils Relative % 04/15/2013 42* 43 - 77 % Final  . Neutro Abs 04/15/2013 3.1  1.7 - 7.7 K/uL Final  . Lymphocytes Relative 04/15/2013 49* 12 - 46 % Final  . Lymphs Abs 04/15/2013 3.6  0.7 - 4.0 K/uL Final  . Monocytes Relative 04/15/2013 5  3 - 12 % Final  . Monocytes Absolute 04/15/2013 0.4  0.1 - 1.0 K/uL Final  . Eosinophils Relative 04/15/2013 4  0 - 5 % Final  . Eosinophils Absolute 04/15/2013 0.3  0.0 - 0.7 K/uL Final  . Basophils Relative 04/15/2013 1  0 - 1 % Final  . Basophils Absolute 04/15/2013 0.1  0.0 - 0.1 K/uL Final  . Sodium 04/15/2013 137  135 - 145 mEq/L Final  . Potassium 04/15/2013 4.6  3.5 - 5.1 mEq/L Final  . Chloride 04/15/2013 102  96 - 112 mEq/L Final  . CO2 04/15/2013 24  19 - 32 mEq/L Final  . Glucose,  Bld 04/15/2013 133* 70 - 99 mg/dL Final  . BUN 16/05/9603 24* 6 - 23 mg/dL Final  . Creatinine, Ser 04/15/2013 0.93  0.50 - 1.35 mg/dL Final  . Calcium 54/04/8118 9.1  8.4 - 10.5 mg/dL Final  . Total Protein 04/15/2013 7.1  6.0 - 8.3 g/dL Final  . Albumin 14/78/2956 2.4* 3.5 - 5.2 g/dL Final  . AST 21/30/8657 34  0 - 37 U/L Final  . ALT 04/15/2013 53  0 - 53 U/L Final  . Alkaline Phosphatase 04/15/2013 408* 39 - 117 U/L Final  . Total Bilirubin 04/15/2013 0.4  0.3 - 1.2 mg/dL Final  . GFR calc non Af Amer 04/15/2013 73* >90 mL/min Final  . GFR calc Af Amer 04/15/2013 84* >90 mL/min Final   Comment: (NOTE)                          The eGFR has been calculated using the CKD EPI equation.                          This calculation has not been validated in all clinical situations.                          eGFR's persistently <90 mL/min signify possible Chronic Kidney                          Disease.     Urinalysis    Component Value Date/Time   COLORURINE YELLOW 02/27/2013 1051   APPEARANCEUR CLEAR 02/27/2013 1051    LABSPEC 1.025 02/27/2013 1051   PHURINE 5.5 02/27/2013 1051   GLUCOSEU NEGATIVE 02/27/2013 1051   HGBUR NEGATIVE 02/27/2013 1051   BILIRUBINUR NEGATIVE 02/27/2013 1051   KETONESUR TRACE* 02/27/2013 1051   PROTEINUR TRACE* 02/27/2013 1051   UROBILINOGEN 0.2 02/27/2013 1051   NITRITE NEGATIVE 02/27/2013 1051   LEUKOCYTESUR NEGATIVE 02/27/2013 1051    RADIOGRAPHIC STUDIES: No results found.  ASSESSMENT: #1. Myelodysplastic syndrome with 5q- cytogenetics currently on Aranesp and periodic blood transfusions depending upon blood count. #2. Revlimid allergy.   PLAN: #1. Aranesp 300 mcg subcutaneous density today and weekly. #2. Call if develops fever or bleeding. #3. Office visit with physical exam in 2 weeks.   All questions were answered. The patient knows to call the clinic with any problems, questions or concerns. We can certainly see the patient much sooner if necessary.   I spent 20 minutes counseling the patient face to face. The total time spent in the appointment was 15 minutes.    Maurilio Lovely, MD 05/14/2013 3:53 PM

## 2013-05-14 NOTE — Progress Notes (Signed)
Lee Ramirez presents today for injection per MD orders. Aranesp 300 mcg administered SQ in right Abdomen. Administration without incident. Patient tolerated well.

## 2013-05-14 NOTE — Progress Notes (Signed)
Clinical Summary Mr. Kipp is a 79 y.o.male 1. CAD/ICM: prior stenting as described below - titration of medications has been limited by low bp and orthostatic changes. Lisinopril stopped recently because of low bp.  - LVEF 15% May 2013 - ASA allergy w/ tongue swelling - denies any chest pain. Generalized fatigue which started after treatments for his myelodysplastic syndrome. Walks w/ walker at home short distances w/ no SOB. No orthopnea, no PND, no LE edema - limiting sodium intake, avoiding NSAIDs - compliant w/ meds  2. Myelodysplastic syndrome - undergoing therapy currently  3. HL - compliant w/ pravastatin  4. HTN - recent problems w/ low bp's, have been titrating down his medication - lisinopril recently stopped.   Past Medical History  Diagnosis Date  . ASCVD (arteriosclerotic cardiovascular disease)     Sizable non-Q. myocardial infarction in 09/2009; severe three-vessel disease with an ejection fraction of 35%; three-vessel DES complicated by thrombocytopenia and mild renal insufficiency  . Hyperlipidemia     09/2010: TC-151, TG-411, H.-32, L.-not calculated   . Anemia     H/H -12.3/37.2; MCV-103; platelets-114 (08/2010)   . 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 Hypertension   . Orthostatic hypotension     Lightheaded; no syncope  . Thrombocytopenia, acquired     09/2010-114,000  . Hepatic steatosis 2013  . Thrombocytopenia     referred to hem-onc 5/13  . Squamous cell skin cancer 10/2012    of nose  . Myelodysplastic syndrome   . Myelodysplastic syndrome      Allergies  Allergen Reactions  . Aspirin Anaphylaxis  . Revlimid [Lenalidomide] Rash     Current Outpatient Prescriptions  Medication Sig Dispense Refill  . acetaminophen (TYLENOL) 500 MG tablet Take 1,000 mg by mouth every 6 (six) hours as needed. Pain      . albuterol (PROAIR HFA) 108 (90 BASE) MCG/ACT inhaler Inhale 2 puffs into the lungs every  6 (six) hours as needed. For shortness of breath      . allopurinol (ZYLOPRIM) 300 MG tablet Take 300 mg by mouth daily.      . carvedilol (COREG) 6.25 MG tablet Take 6.25 mg by mouth 2 (two) times daily with a meal.      . CRANBERRY PO Take 1 capsule by mouth daily.      . meclizine (ANTIVERT) 12.5 MG tablet Take 12.5 mg by mouth 3 (three) times daily as needed. Dizziness      . Multiple Vitamins-Iron (DAILY-VITAMIN/IRON PO) Take 1 tablet by mouth daily.      . nitroGLYCERIN (NITROSTAT) 0.4 MG SL tablet Place 1 tablet (0.4 mg total) under the tongue every 5 (five) minutes as needed.  25 tablet  3  . omeprazole (PRILOSEC) 20 MG capsule Take 20 mg by mouth daily.      . pravastatin (PRAVACHOL) 40 MG tablet Take 1 tablet (40 mg total) by mouth daily.  30 tablet  3  . Travoprost, BAK Free, (TRAVATAN) 0.004 % SOLN ophthalmic solution Place 1 drop into both eyes at bedtime.       . vitamin B-12 (CYANOCOBALAMIN) 100 MCG tablet Take 3,000 mcg by mouth daily.       Marland Kitchen zolpidem (AMBIEN) 10 MG tablet Take 5 mg by mouth at bedtime as needed. For sleep       No current facility-administered medications for this visit.     Past Surgical History  Procedure Laterality Date  .  Inguinal hernia repair      Right  . Appendectomy    . Coronary angioplasty with stent placement    . Bone marrow biopsy  02/19/12  . Bone marrow aspiration  02/19/12  . Skin cancer excision  10/2012     Allergies  Allergen Reactions  . Aspirin Anaphylaxis  . Revlimid [Lenalidomide] Rash      No family history on file.   Social History Mr. Piet reports that he has never smoked. He has never used smokeless tobacco. Mr. Bollig reports that he does not drink alcohol.   Review of Systems 12 point ROS negative other than reported in HPI  Physical Examination There were no vitals filed for this visit. Filed Weights   05/14/13 1257  Weight: 161 lb (73.029 kg)   Gen: resting comfortably, NAD HEENT: no scleral  icterus, pupils equal round and reactive, no palptable cervical adenopathy CV: RRR, no m/r/g, no JVD Pulm: CTAB Abd: soft, NT, ND NABS, no hepatosplenomegaly Ext: warm, no edema.  Skin: warm, no rash Neuro: A&Ox3, no focal deficits    Diagnostic Studies Echo 12/2011: LVEF 15%, mild MR, mod LAE, mod RAE, small effusion  Cath Feb 2011: LM 30%, LAD 70% ending in a 99% lesion, LCX 25%, RCA 95% mid. LVEF 35%.   -DES to LAD, DES to OM1, DES to RCA, PTCA distal RCA  Assessment and Plan  1. Chronic systolic heart failure/ICM - limited with medical therapy due to low bp and orthostatic symptoms, currently just tolerating coreg 6.25mg  bid - not an ICD candidate due to overall comorbidities and poor overall prognosis, risk of bleeding and infection w/ myelodysplasic syndrome.  - euvolemic today in clinic - continue current meds  2. HL - continue current statin  3. HTN - issue recently has been low bp - continue coreg for now, counseled if symptoms to decrease to 1/2 tablet bid     Antoine Poche, M.D., F.A.C.C.

## 2013-05-14 NOTE — Patient Instructions (Addendum)
Your physician recommends that you schedule a follow-up appointment in: 6 months You will receive a reminder letter two months in advance reminding you to call and schedule your appointment. If you don't receive this letter, please contact our office.  Your physician recommends that you continue on your current medications as directed. Please refer to the Current Medication list given to you today.     

## 2013-05-14 NOTE — Patient Instructions (Addendum)
[facility] Discharge Instructions  RECOMMENDATIONS MADE BY THE CONSULTANT AND ANY TEST RESULTS WILL BE SENT TO YOUR REFERRING PHYSICIAN.  Hemoglobin today 9.2 Aranesp 300 mcg given. We will do lab work weekly and give Aranesp injections as long as hemoglobin less than 11. Return to clinic in 2 weeks to see MD.  Thank you for choosing [facility] to provide your oncology and hematology care.  To afford each patient quality time with our providers, please arrive at least 15 minutes before your scheduled appointment time.  With your help, our goal is to use those 15 minutes to complete the necessary work-up to ensure our physicians have the information they need to help with your evaluation and healthcare recommendations.    Effective January 1st, 2014, we ask that you re-schedule your appointment with our physicians should you arrive 10 or more minutes late for your appointment.  We strive to give you quality time with our providers, and arriving late affects you and other patients whose appointments are after yours.    Again, thank you for choosing [facility].  Our hope is that these requests will decrease the amount of time that you wait before being seen by our physicians.       _____________________________________________________________  Should you have questions after your visit to [facility], please contact our office at [phone] between the hours of 8:30 a.m. and 5:00 p.m.  Voicemails left after 4:30 p.m. will not be returned until the following business day.  For prescription refill requests, have your pharmacy contact our office with your prescription refill request.

## 2013-05-18 ENCOUNTER — Ambulatory Visit: Payer: Medicare Other | Admitting: Cardiology

## 2013-05-20 ENCOUNTER — Encounter (HOSPITAL_COMMUNITY): Payer: Medicare Other | Attending: Internal Medicine

## 2013-05-20 ENCOUNTER — Encounter (HOSPITAL_BASED_OUTPATIENT_CLINIC_OR_DEPARTMENT_OTHER): Payer: Medicare Other

## 2013-05-20 VITALS — BP 93/53 | HR 81 | Temp 97.5°F | Resp 18

## 2013-05-20 DIAGNOSIS — D46C Myelodysplastic syndrome with isolated del(5q) chromosomal abnormality: Secondary | ICD-10-CM

## 2013-05-20 DIAGNOSIS — D469 Myelodysplastic syndrome, unspecified: Secondary | ICD-10-CM | POA: Insufficient documentation

## 2013-05-20 LAB — CBC WITH DIFFERENTIAL/PLATELET
Basophils Absolute: 0 10*3/uL (ref 0.0–0.1)
Basophils Relative: 1 % (ref 0–1)
Eosinophils Relative: 4 % (ref 0–5)
Lymphocytes Relative: 45 % (ref 12–46)
Lymphs Abs: 2.6 10*3/uL (ref 0.7–4.0)
MCHC: 32.7 g/dL (ref 30.0–36.0)
MCV: 96.5 fL (ref 78.0–100.0)
Neutro Abs: 2.6 10*3/uL (ref 1.7–7.7)
Neutrophils Relative %: 44 % (ref 43–77)
Platelets: 99 10*3/uL — ABNORMAL LOW (ref 150–400)
RBC: 2.54 MIL/uL — ABNORMAL LOW (ref 4.22–5.81)
RDW: 19.1 % — ABNORMAL HIGH (ref 11.5–15.5)
WBC: 5.8 10*3/uL (ref 4.0–10.5)

## 2013-05-20 MED ORDER — DARBEPOETIN ALFA-POLYSORBATE 300 MCG/0.6ML IJ SOLN
300.0000 ug | Freq: Once | INTRAMUSCULAR | Status: AC
  Administered 2013-05-20: 300 ug via SUBCUTANEOUS

## 2013-05-20 MED ORDER — DARBEPOETIN ALFA-POLYSORBATE 300 MCG/0.6ML IJ SOLN
INTRAMUSCULAR | Status: AC
  Filled 2013-05-20: qty 0.6

## 2013-05-20 NOTE — Progress Notes (Signed)
Labs drawn today for cbc/diff 

## 2013-05-22 ENCOUNTER — Encounter (HOSPITAL_BASED_OUTPATIENT_CLINIC_OR_DEPARTMENT_OTHER): Payer: Medicare Other

## 2013-05-22 VITALS — BP 129/59 | HR 89 | Temp 97.6°F | Resp 16

## 2013-05-22 DIAGNOSIS — D46C Myelodysplastic syndrome with isolated del(5q) chromosomal abnormality: Secondary | ICD-10-CM

## 2013-05-22 DIAGNOSIS — D469 Myelodysplastic syndrome, unspecified: Secondary | ICD-10-CM

## 2013-05-22 MED ORDER — FUROSEMIDE 10 MG/ML IJ SOLN
40.0000 mg | Freq: Once | INTRAMUSCULAR | Status: AC
  Administered 2013-05-22: 40 mg via INTRAVENOUS
  Filled 2013-05-22: qty 4

## 2013-05-22 MED ORDER — ACETAMINOPHEN 325 MG PO TABS
650.0000 mg | ORAL_TABLET | Freq: Once | ORAL | Status: AC
  Administered 2013-05-22: 650 mg via ORAL

## 2013-05-22 MED ORDER — DIPHENHYDRAMINE HCL 25 MG PO CAPS
ORAL_CAPSULE | ORAL | Status: AC
Start: 2013-05-22 — End: 2013-05-22
  Filled 2013-05-22: qty 1

## 2013-05-22 MED ORDER — DIPHENHYDRAMINE HCL 25 MG PO CAPS
25.0000 mg | ORAL_CAPSULE | Freq: Once | ORAL | Status: AC
  Administered 2013-05-22: 25 mg via ORAL

## 2013-05-22 MED ORDER — SODIUM CHLORIDE 0.9 % IJ SOLN: 10.0000 mL | INTRAMUSCULAR | Status: DC | PRN

## 2013-05-22 MED ORDER — SODIUM CHLORIDE 0.9 % IV SOLN
250.0000 mL | Freq: Once | INTRAVENOUS | Status: AC
  Administered 2013-05-22: 250 mL via INTRAVENOUS

## 2013-05-22 MED ORDER — ACETAMINOPHEN 325 MG PO TABS
ORAL_TABLET | ORAL | Status: AC
  Filled 2013-05-22: qty 2

## 2013-05-22 NOTE — Progress Notes (Signed)
Tolerated blood transfusion without problems 

## 2013-05-23 LAB — TYPE AND SCREEN
ABO/RH(D): O POS
Unit division: 0
Unit division: 0

## 2013-05-29 ENCOUNTER — Encounter (HOSPITAL_COMMUNITY): Payer: Medicare Other

## 2013-05-29 ENCOUNTER — Encounter (HOSPITAL_BASED_OUTPATIENT_CLINIC_OR_DEPARTMENT_OTHER): Payer: Medicare Other | Admitting: Oncology

## 2013-05-29 ENCOUNTER — Encounter (HOSPITAL_COMMUNITY): Payer: Self-pay | Admitting: Oncology

## 2013-05-29 VITALS — BP 89/52 | HR 58 | Temp 97.0°F | Resp 18

## 2013-05-29 DIAGNOSIS — D469 Myelodysplastic syndrome, unspecified: Secondary | ICD-10-CM

## 2013-05-29 DIAGNOSIS — D649 Anemia, unspecified: Secondary | ICD-10-CM

## 2013-05-29 DIAGNOSIS — E538 Deficiency of other specified B group vitamins: Secondary | ICD-10-CM

## 2013-05-29 LAB — CBC WITH DIFFERENTIAL/PLATELET
Basophils Relative: 0 % (ref 0–1)
Eosinophils Absolute: 0.4 10*3/uL (ref 0.0–0.7)
Eosinophils Relative: 5 % (ref 0–5)
HCT: 25 % — ABNORMAL LOW (ref 39.0–52.0)
Lymphs Abs: 1.8 10*3/uL (ref 0.7–4.0)
MCH: 31.4 pg (ref 26.0–34.0)
MCV: 95.8 fL (ref 78.0–100.0)
Monocytes Absolute: 0.5 10*3/uL (ref 0.1–1.0)
Monocytes Relative: 7 % (ref 3–12)
Neutro Abs: 4.4 10*3/uL (ref 1.7–7.7)
Platelets: 62 10*3/uL — ABNORMAL LOW (ref 150–400)
RDW: 19 % — ABNORMAL HIGH (ref 11.5–15.5)
WBC: 7.1 10*3/uL (ref 4.0–10.5)

## 2013-05-29 MED ORDER — DARBEPOETIN ALFA-POLYSORBATE 300 MCG/0.6ML IJ SOLN
INTRAMUSCULAR | Status: AC
  Filled 2013-05-29: qty 0.6

## 2013-05-29 MED ORDER — CYANOCOBALAMIN 1000 MCG/ML IJ SOLN
1000.0000 ug | Freq: Once | INTRAMUSCULAR | Status: AC
  Administered 2013-05-29: 1000 ug via INTRAMUSCULAR

## 2013-05-29 MED ORDER — DARBEPOETIN ALFA-POLYSORBATE 300 MCG/0.6ML IJ SOLN
300.0000 ug | Freq: Once | INTRAMUSCULAR | Status: AC
  Administered 2013-05-29: 300 ug via SUBCUTANEOUS

## 2013-05-29 MED ORDER — CYANOCOBALAMIN 1000 MCG/ML IJ SOLN
INTRAMUSCULAR | Status: AC
  Filled 2013-05-29: qty 1

## 2013-05-29 NOTE — Patient Instructions (Signed)
[facility] Discharge Instructions  RECOMMENDATIONS MADE BY THE CONSULTANT AND ANY TEST RESULTS WILL BE SENT TO YOUR REFERRING PHYSICIAN.  EXAM FINDINGS BY THE PHYSICIAN TODAY AND SIGNS OR SYMPTOMS TO REPORT TO CLINIC OR PRIMARY PHYSICIAN: Exam and findings as discussed by T. Kefalas, PA-C.  INSTRUCTIONS/FOLLOW-UP: 1.  You have been scheduled to receive 2 units of blood Monday @ 0845. 2.  You received your Aranesp and B12 injections today. 3.  Please keep your appointment in 2 weeks to see the physician. 4.  Please take 2 Aleve three times daily if needed for your left rib pain. 5.  Increase your fluid intake to help improve your blood pressure.  Thank you for choosing [facility] to provide your oncology and hematology care.  To afford each patient quality time with our providers, please arrive at least 15 minutes before your scheduled appointment time.  With your help, our goal is to use those 15 minutes to complete the necessary work-up to ensure our physicians have the information they need to help with your evaluation and healthcare recommendations.    Effective January 1st, 2014, we ask that you re-schedule your appointment with our physicians should you arrive 10 or more minutes late for your appointment.  We strive to give you quality time with our providers, and arriving late affects you and other patients whose appointments are after yours.    Again, thank you for choosing [facility].  Our hope is that these requests will decrease the amount of time that you wait before being seen by our physicians.       _____________________________________________________________  Should you have questions after your visit to [facility], please contact our office at [phone] between the hours of 8:30 a.m. and 5:00 p.m.  Voicemails left after 4:30 p.m. will not be returned until the following business day.  For prescription refill  requests, have your pharmacy contact our office with your prescription refill request.

## 2013-05-29 NOTE — Progress Notes (Signed)
[name], MD [address] [place] [address]  MDS (myelodysplastic syndrome) with 5q- syndrome - Plan: CBC with Differential  CURRENT THERAPY: Aranesp 300 mcg weekly.  Vitamin B12 every 4 weeks. Blood transfusions PRN.  INTERVAL HISTORY: Lee Ramirez 79 y.o. 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  Aziel fell the other day and injured his right thorax.  Clinically, I suspect a fractured rib versus strain.  Either way, treatment remains symptomatic.  I recommended Aleve TID.  He may utilize ice and heat symptomatically.  He fell secondary to loss of balance on chair.  Details are hazy and not clear.  I personally reviewed and went over laboratory results with the patient.  Hgb is down to 8.2 g/dL today after a 2 unit PRBC transfusion last week.  Additionally, platelets are down to 62,000 which is the lowest it has been dating back to at least Dec 2013. WBC is WNL.  The patient's daughter is not yet ready for Hospice, which I believe is appropriate at any time.  Therefore, we will administer Aranesp today and then prepare for a 2 unit PRBC transfusion with irradiated blood on Monday.  We will continue with Aranesp.  He otherwise denies any complaints and hematologic ROS questioning is negative.  I provided education regarding the pitfalls of PRBC transfusion dependency.  BP is noted to be low, but he does not want to be admitted to the hospital.  He was encouraged to increase PO fluid intake.  He will get fluids with blood transfusion on Monday.  Past Medical History  Diagnosis Date  . ASCVD (arteriosclerotic cardiovascular disease)     Sizable non-Q. myocardial infarction in 09/2009; severe three-vessel disease with an ejection fraction of 35%; three-vessel DES complicated by thrombocytopenia and mild renal  insufficiency  . Hyperlipidemia     09/2010: TC-151, TG-411, H.-32, L.-not calculated   . Anemia     H/H -12.3/37.2; MCV-103; platelets-114 (08/2010)   . 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 Hypertension   . Orthostatic hypotension     Lightheaded; no syncope  . Thrombocytopenia, acquired     09/2010-114,000  . Hepatic steatosis 2013  . Thrombocytopenia     referred to hem-onc 5/13  . Squamous cell skin cancer 10/2012    of nose  . Myelodysplastic syndrome   . Myelodysplastic syndrome     has Arteriosclerotic cardiovascular disease (ASCVD)-ischemic cardiomyopathy; Thrombocytopenia, acquired; Hyperlipidemia; Hypertension; Orthostatic hypotension; Anemia, macrocytic; Insomnia; GERD (gastroesophageal reflux disease); MDS (myelodysplastic syndrome) with 5q- syndrome; Acute renal failure; Anemia; Cardiomyopathy; Weakness; Hypotension, unspecified; and Chronic systolic CHF (congestive heart failure) on his problem list.     is allergic to aspirin and revlimid.  Mr. Amendola does not currently have medications on file.  Past Surgical History  Procedure Laterality Date  . Inguinal hernia repair      Right  . Appendectomy    . Coronary angioplasty with stent placement    . Bone marrow biopsy  02/19/12  . Bone marrow aspiration  02/19/12  . Skin cancer excision  10/2012    Denies any headaches, dizziness, double vision, fevers, chills, night sweats, nausea, vomiting, diarrhea, constipation, chest pain, heart palpitations, shortness of breath, blood in stool, black tarry stool, urinary pain, urinary burning, urinary frequency, hematuria.  PHYSICAL EXAMINATION  ECOG PERFORMANCE STATUS: 2 - Symptomatic, <50% confined to bed  Filed Vitals:   05/29/13 1502  BP: 89/52  Pulse: 58  Temp: 97 F (36.1 C)  Resp: 18    GENERAL:alert, no distress, cachectic, cooperative, ill looking and pale SKIN: skin color, texture, turgor are normal, no rashes  or significant lesions HEAD: Normocephalic, No masses, lesions, tenderness or abnormalities EYES: normal, PERRLA, EOMI, Conjunctiva are pink and non-injected EARS: External ears normal OROPHARYNX:mucous membranes are moist  NECK: supple, no adenopathy, thyroid normal size, non-tender, without nodularity, no stridor, non-tender, trachea midline LYMPH:  no palpable lymphadenopathy BREAST:not examined LUNGS: clear to auscultation  HEART: regular rate & rhythm, no murmurs and no gallops ABDOMEN:abdomen soft, non-tender and normal bowel sounds BACK: Back symmetric, no curvature. EXTREMITIES:less then 2 second capillary refill, no joint deformities, effusion, or inflammation, no edema, no cyanosis  NEURO: alert & oriented x 3 with fluent speech, no focal motor/sensory deficits, in wheelchair   LABORATORY DATA: CBC    Component Value Date/Time   WBC 7.1 05/29/2013 1504   RBC 2.61* 05/29/2013 1504   RBC 2.71* 01/07/2012 1630   HGB 8.2* 05/29/2013 1504   HCT 25.0* 05/29/2013 1504   PLT 62* 05/29/2013 1504   MCV 95.8 05/29/2013 1504   MCH 31.4 05/29/2013 1504   MCHC 32.8 05/29/2013 1504   RDW 19.0* 05/29/2013 1504   LYMPHSABS 1.8 05/29/2013 1504   MONOABS 0.5 05/29/2013 1504   EOSABS 0.4 05/29/2013 1504   BASOSABS 0.0 05/29/2013 1504     ASSESSMENT:  1. 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  2. Recurrent falls, with injury to right thorax.  Patient Active Problem List   Diagnosis Date Noted  . Hypotension, unspecified 04/06/2013  . Chronic systolic CHF (congestive heart failure) 04/06/2013  . Acute renal failure 02/27/2013  . Anemia 02/27/2013  . Cardiomyopathy 02/27/2013  . Weakness 02/27/2013  . MDS (myelodysplastic syndrome) with 5q- syndrome 03/19/2012  . GERD (gastroesophageal reflux disease) 11/24/2011  . Insomnia 11/15/2011  . Thrombocytopenia, acquired   . Hyperlipidemia   . Hypertension    . Orthostatic hypotension   . Anemia, macrocytic   . Arteriosclerotic cardiovascular disease (ASCVD)-ischemic cardiomyopathy 11/21/2009     PLAN:  1. I personally reviewed and went over laboratory results with the patient. 2. .Labs today: CBC diff 3. Aranesp supportive therapy plan updated and reviewed 4. Aranesp 300 mcg today 5. Vitamin B12 supportive therapy plan reviewed and updated 6. Vitamin B12 injection today 7. 2 units irradiated PRBC transfusion on Monday 8. Discussed pitfalls of PRBC transfusion dependency 9. Continue with weekly labs: CBC 10. Weekly Aranesp 300 mcg 11. Encouraged increased PO fluids.  12. Recommend Aleve TID 13. 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  All questions were answered. The patient knows to call the clinic with any problems, questions or concerns. We can certainly see the patient much sooner if necessary.  Patient and plan discussed with Dr. Alla German and he is in agreement with the aforementioned.   More than 50% of the time spent with the patient was utilized for counseling and coordination of care.   Tranisha Tissue

## 2013-05-29 NOTE — Progress Notes (Signed)
Dontreal Miera Mankins's reason for visit today is for an MDvisit,  injection and labs as scheduled per MD orders.  Labs were drawn prior to administration of ordered medication.  Venipuncture performed with a 23 gauge butterfly needle to L Antecubital.  Luetta Nutting also received Aranesp and Vit B12 per Samuella Bruin, PA-C and per MD orders; see [facility] for administration details.  JEREMAIH KLIMA tolerated all procedures well and without incident; questions were answered and patient was discharged.

## 2013-06-01 ENCOUNTER — Encounter (HOSPITAL_BASED_OUTPATIENT_CLINIC_OR_DEPARTMENT_OTHER): Payer: Medicare Other

## 2013-06-01 VITALS — BP 134/70 | HR 105 | Temp 98.1°F | Resp 16

## 2013-06-01 DIAGNOSIS — D469 Myelodysplastic syndrome, unspecified: Secondary | ICD-10-CM

## 2013-06-01 MED ORDER — SODIUM CHLORIDE 0.9 % IV SOLN
250.0000 mL | Freq: Once | INTRAVENOUS | Status: AC
  Administered 2013-06-01: 250 mL via INTRAVENOUS

## 2013-06-01 MED ORDER — SODIUM CHLORIDE 0.9 % IJ SOLN
10.0000 mL | INTRAMUSCULAR | Status: AC | PRN
  Administered 2013-06-01: 10 mL

## 2013-06-01 NOTE — Progress Notes (Signed)
Tolerated 2 units prbc's well. IV d/c.  Home with family member.

## 2013-06-02 LAB — TYPE AND SCREEN: Unit division: 0

## 2013-06-11 ENCOUNTER — Ambulatory Visit (HOSPITAL_COMMUNITY): Payer: Medicare Other

## 2013-06-19 ENCOUNTER — Encounter (HOSPITAL_COMMUNITY): Payer: Self-pay

## 2013-06-19 ENCOUNTER — Encounter (HOSPITAL_BASED_OUTPATIENT_CLINIC_OR_DEPARTMENT_OTHER): Payer: Medicare Other

## 2013-06-19 VITALS — BP 97/91 | HR 92 | Temp 97.5°F | Resp 22 | Wt 158.3 lb

## 2013-06-19 DIAGNOSIS — D46C Myelodysplastic syndrome with isolated del(5q) chromosomal abnormality: Secondary | ICD-10-CM

## 2013-06-19 DIAGNOSIS — Z23 Encounter for immunization: Secondary | ICD-10-CM

## 2013-06-19 DIAGNOSIS — D469 Myelodysplastic syndrome, unspecified: Secondary | ICD-10-CM

## 2013-06-19 LAB — CBC WITH DIFFERENTIAL/PLATELET
Basophils Absolute: 0 10*3/uL (ref 0.0–0.1)
Basophils Relative: 0 % (ref 0–1)
Eosinophils Relative: 7 % — ABNORMAL HIGH (ref 0–5)
HCT: 22.1 % — ABNORMAL LOW (ref 39.0–52.0)
Hemoglobin: 7 g/dL — ABNORMAL LOW (ref 13.0–17.0)
MCHC: 31.7 g/dL (ref 30.0–36.0)
MCV: 96.5 fL (ref 78.0–100.0)
Monocytes Absolute: 0.8 10*3/uL (ref 0.1–1.0)
Neutrophils Relative %: 62 % (ref 43–77)
Platelets: 96 10*3/uL — ABNORMAL LOW (ref 150–400)
RBC: 2.29 MIL/uL — ABNORMAL LOW (ref 4.22–5.81)
RDW: 20 % — ABNORMAL HIGH (ref 11.5–15.5)
WBC: 8.6 10*3/uL (ref 4.0–10.5)

## 2013-06-19 MED ORDER — DARBEPOETIN ALFA-POLYSORBATE 500 MCG/ML IJ SOLN
500.0000 ug | Freq: Once | INTRAMUSCULAR | Status: AC
  Administered 2013-06-19: 500 ug via SUBCUTANEOUS
  Filled 2013-06-19: qty 1

## 2013-06-19 MED ORDER — INFLUENZA VAC SPLIT QUAD 0.5 ML IM SUSP
0.5000 mL | Freq: Once | INTRAMUSCULAR | Status: AC
  Administered 2013-06-19: 0.5 mL via INTRAMUSCULAR
  Filled 2013-06-19: qty 0.5

## 2013-06-19 NOTE — Addendum Note (Signed)
Addended by: Evelena Leyden on: 06/19/2013 12:10 PM   Modules accepted: Orders, SmartSet

## 2013-06-19 NOTE — Progress Notes (Signed)
[facility]  OFFICE PROGRESS NOTE  [name], MD [address] [place] [address]  DIAGNOSIS: MDS (myelodysplastic syndrome) with 5q- syndrome - Plan: CBC with Differential  Chief Complaint  Patient presents with  . Transfusion dependant MDS    CURRENT THERAPY: Periodic red blood cell transfusions for symptomatic anemia due to 5 q- MDS along with Aranesp. He had initially developed an allergic reaction to Revlimid   INTERVAL HISTORY: Lee Ramirez 79 y.o. male returns for followup of MDS, transfusion dependent with red cells.he patient is slowly deteriorating. He even expressed the wish to thigh. He is short of breath on mild exertion and has had poor appetite. He denies any incontinence, constipation, pain, cough or wheezing, PND, orthopnea, or palpitations. He also denies any significant lower extremity swelling. He spends nearly the entire day in bed.    MEDICAL HISTORY: Past Medical History  Diagnosis Date  . ASCVD (arteriosclerotic cardiovascular disease)     Sizable non-Q. myocardial infarction in 09/2009; severe three-vessel disease with an ejection fraction of 35%; three-vessel DES complicated by thrombocytopenia and mild renal insufficiency  . Hyperlipidemia     09/2010: TC-151, TG-411, H.-32, L.-not calculated   . Anemia     H/H -12.3/37.2; MCV-103; platelets-114 (08/2010)   . 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 Hypertension   . Orthostatic hypotension     Lightheaded; no syncope  . Thrombocytopenia, acquired     09/2010-114,000  . Hepatic steatosis 2013  . Thrombocytopenia     referred to hem-onc 5/13  . Squamous cell skin cancer 10/2012    of nose  . Myelodysplastic syndrome   . Myelodysplastic syndrome     INTERIM HISTORY: has Arteriosclerotic cardiovascular disease (ASCVD)-ischemic cardiomyopathy; Thrombocytopenia, acquired;  Hyperlipidemia; Hypertension; Orthostatic hypotension; Anemia, macrocytic; Insomnia; GERD (gastroesophageal reflux disease); MDS (myelodysplastic syndrome) with 5q- syndrome; Cardiomyopathy; Weakness; and Chronic systolic CHF (congestive heart failure) on his problem list.    ALLERGIES:  is allergic to aspirin and revlimid.  MEDICATIONS: has a current medication list which includes the following prescription(s): acetaminophen, albuterol, allopurinol, carvedilol, cranberry, meclizine, multiple vitamins-iron, nitroglycerin, omeprazole, pravastatin, travoprost (bak free), vitamin b-12, and zolpidem.  SURGICAL HISTORY:  Past Surgical History  Procedure Laterality Date  . Inguinal hernia repair      Right  . Appendectomy    . Coronary angioplasty with stent placement    . Bone marrow biopsy  02/19/12  . Bone marrow aspiration  02/19/12  . Skin cancer excision  10/2012    FAMILY HISTORY: family history is not on file.  SOCIAL HISTORY:  reports that he has never smoked. He has never used smokeless tobacco. He reports that he does not drink alcohol or use illicit drugs.  REVIEW OF SYSTEMS:  Other than that discussed above is noncontributory.  PHYSICAL EXAMINATION: ECOG PERFORMANCE STATUS: 4 - Bedbound  Weight 158 lb 4.8 oz (71.804 kg).  GENERAL:alert, no distress and comfortable. Pale SKIN: skin color, texture, turgor are normal, no rashes or significant lesions EYES: PERLA; Conjunctiva are pink and non-injected, sclera clear OROPHARYNX:no exudate, no erythema on lips, buccal mucosa, or tongue. NECK: supple, thyroid normal size, non-tender, without nodularity. No masses CHEST: Increased AP diameter with no breast masses. LYMPH:  no palpable lymphadenopathy in the cervical, axillary or inguinal LUNGS: clear to auscultation and percussion with normal breathing effort HEART: regular rate &  rhythm with a grade 2/6 ejection systolic murmur at the lower lobes unimportant it is  nonradiating. ABDOMEN:abdomen soft, non-tender and normal bowel sounds MUSCULOSKELETAL:no cyanosis of digits and no clubbing. Range of motion normal.  NEURO: alert & oriented x 3 with fluent speech, no focal motor/sensory deficits   LABORATORY DATA: Office Visit on 05/29/2013  Component Date Value Range Status  . WBC 05/29/2013 7.1  4.0 - 10.5 K/uL Final  . RBC 05/29/2013 2.61* 4.22 - 5.81 MIL/uL Final  . Hemoglobin 05/29/2013 8.2* 13.0 - 17.0 g/dL Final  . HCT 16/05/9603 25.0* 39.0 - 52.0 % Final  . MCV 05/29/2013 95.8  78.0 - 100.0 fL Final  . MCH 05/29/2013 31.4  26.0 - 34.0 pg Final  . MCHC 05/29/2013 32.8  30.0 - 36.0 g/dL Final  . RDW 54/04/8118 19.0* 11.5 - 15.5 % Final  . Platelets 05/29/2013 62* 150 - 400 K/uL Final  . Neutrophils Relative % 05/29/2013 63  43 - 77 % Final  . Lymphocytes Relative 05/29/2013 25  12 - 46 % Final  . Monocytes Relative 05/29/2013 7  3 - 12 % Final  . Eosinophils Relative 05/29/2013 5  0 - 5 % Final  . Basophils Relative 05/29/2013 0  0 - 1 % Final  . Neutro Abs 05/29/2013 4.4  1.7 - 7.7 K/uL Final  . Lymphs Abs 05/29/2013 1.8  0.7 - 4.0 K/uL Final  . Monocytes Absolute 05/29/2013 0.5  0.1 - 1.0 K/uL Final  . Eosinophils Absolute 05/29/2013 0.4  0.0 - 0.7 K/uL Final  . Basophils Absolute 05/29/2013 0.0  0.0 - 0.1 K/uL Final  . RBC Morphology 05/29/2013 POLYCHROMASIA PRESENT   Final  . WBC Morphology 05/29/2013 MILD LEFT SHIFT (1-5% METAS, OCC MYELO, OCC BANDS)   Final  . Smear Review 05/29/2013 LARGE PLATELETS PRESENT   Final   Comment: PLATELET COUNT CONFIRMED BY SMEAR                          PLATELETS APPEAR DECREASED  . ABO/RH(D) 05/29/2013 O POS   Final  . Antibody Screen 05/29/2013 NEG   Final  . Sample Expiration 05/29/2013 06/01/2013   Final  . Unit Number 05/29/2013 [id number]   Final  . Blood Component Type 05/29/2013 RBC, LR IRR   Final  . Unit division 05/29/2013 00   Final  . Status of Unit 05/29/2013 ISSUED,FINAL   Final  .  Transfusion Status 05/29/2013 OK TO TRANSFUSE   Final  . Crossmatch Result 05/29/2013 Compatible   Final  . Unit Number 05/29/2013 [id number]   Final  . Blood Component Type 05/29/2013 RBC, LR IRR   Final  . Unit division 05/29/2013 00   Final  . Status of Unit 05/29/2013 ISSUED,FINAL   Final  . Transfusion Status 05/29/2013 OK TO TRANSFUSE   Final  . Crossmatch Result 05/29/2013 Compatible   Final    PATHOLOGY:  Urinalysis    Component Value Date/Time   COLORURINE YELLOW 02/27/2013 1051   APPEARANCEUR CLEAR 02/27/2013 1051   LABSPEC 1.025 02/27/2013 1051   PHURINE 5.5 02/27/2013 1051   GLUCOSEU NEGATIVE 02/27/2013 1051   HGBUR NEGATIVE 02/27/2013 1051   BILIRUBINUR NEGATIVE 02/27/2013 1051   KETONESUR TRACE* 02/27/2013 1051   PROTEINUR TRACE* 02/27/2013 1051   UROBILINOGEN 0.2 02/27/2013 1051   NITRITE NEGATIVE 02/27/2013 1051   LEUKOCYTESUR NEGATIVE 02/27/2013 1051    RADIOGRAPHIC STUDIES: No results found.  ASSESSMENT:  #1. 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    PLAN:  #1. Aranesp 500 mcg subcutaneously today. #2. Transfuse 2 units of packed red blood cells. #3. He does not want to be hospitalized.  #4. Influenza virus vaccine. #5. Hospice referral. Discussed with daughter who accompanied him today.   All questions were answered. The patient knows to call the clinic with any problems, questions or concerns. We can certainly see the patient much sooner if necessary.   I spent 25 minutes counseling the patient face to face. The total time spent in the appointment was 30 minutes.    Maurilio Lovely, MD 06/19/2013 11:09 AM

## 2013-06-19 NOTE — Addendum Note (Signed)
Addended byLeida Lauth on: 06/19/2013 12:13 PM   Modules accepted: Orders

## 2013-06-19 NOTE — Patient Instructions (Signed)
[facility] Discharge Instructions  RECOMMENDATIONS MADE BY THE CONSULTANT AND ANY TEST RESULTS WILL BE SENT TO YOUR REFERRING PHYSICIAN.  EXAM FINDINGS BY THE PHYSICIAN TODAY AND SIGNS OR SYMPTOMS TO REPORT TO CLINIC OR PRIMARY PHYSICIAN: Exam and findings as discussed by Dr. Zigmund Daniel.  We will give you your flu vaccine today, give you an aranesp injections and get you set up for transfusion and will make hospice referral.  MEDICATIONS PRESCRIBED:  None   INSTRUCTIONS/FOLLOW-UP: Follow-up in 2 weeks.  Thank you for choosing [facility] to provide your oncology and hematology care.  To afford each patient quality time with our providers, please arrive at least 15 minutes before your scheduled appointment time.  With your help, our goal is to use those 15 minutes to complete the necessary work-up to ensure our physicians have the information they need to help with your evaluation and healthcare recommendations.    Effective January 1st, 2014, we ask that you re-schedule your appointment with our physicians should you arrive 10 or more minutes late for your appointment.  We strive to give you quality time with our providers, and arriving late affects you and other patients whose appointments are after yours.    Again, thank you for choosing [facility].  Our hope is that these requests will decrease the amount of time that you wait before being seen by our physicians.       _____________________________________________________________  Should you have questions after your visit to [facility], please contact our office at [phone] between the hours of 8:30 a.m. and 5:00 p.m.  Voicemails left after 4:30 p.m. will not be returned until the following business day.  For prescription refill requests, have your pharmacy contact our office with your prescription refill request.

## 2013-06-19 NOTE — Addendum Note (Signed)
Addended by: Edythe Lynn A on: 06/19/2013 12:13 PM   Modules accepted: Orders, SmartSet

## 2013-06-22 ENCOUNTER — Encounter (HOSPITAL_COMMUNITY): Payer: Medicare Other | Attending: Internal Medicine

## 2013-06-22 VITALS — BP 116/64 | HR 90 | Temp 97.5°F | Resp 16

## 2013-06-22 DIAGNOSIS — K219 Gastro-esophageal reflux disease without esophagitis: Secondary | ICD-10-CM | POA: Insufficient documentation

## 2013-06-22 DIAGNOSIS — R3989 Other symptoms and signs involving the genitourinary system: Secondary | ICD-10-CM | POA: Insufficient documentation

## 2013-06-22 DIAGNOSIS — D469 Myelodysplastic syndrome, unspecified: Secondary | ICD-10-CM

## 2013-06-22 MED ORDER — SODIUM CHLORIDE 0.9 % IJ SOLN
10.0000 mL | INTRAMUSCULAR | Status: AC | PRN
  Administered 2013-06-22: 10 mL

## 2013-06-22 MED ORDER — SODIUM CHLORIDE 0.9 % IV SOLN
250.0000 mL | Freq: Once | INTRAVENOUS | Status: AC
  Administered 2013-06-22: 250 mL via INTRAVENOUS

## 2013-06-22 NOTE — Progress Notes (Signed)
Received 2 units packed red blood cells.  Tolerated transfusion well. Home with daughter post transfusion/

## 2013-06-23 LAB — TYPE AND SCREEN
ABO/RH(D): O POS
Unit division: 0
Unit division: 0

## 2013-07-02 NOTE — Progress Notes (Signed)
Lee Ruths, MD [address] [place] [address]  MDS (myelodysplastic syndrome) with 5q- syndrome - Plan: CBC with Differential, cyanocobalamin ((VITAMIN B-12)) injection 1,000 mcg, darbepoetin alfa-polysorbate (ARANESP) injection 500 mcg  GERD (gastroesophageal reflux disease) - Plan: omeprazole (PRILOSEC) 20 MG capsule, sucralfate (CARAFATE) 1 GM/10ML suspension  UTI symptoms - Plan: ciprofloxacin (CIPRO) 500 MG tablet  Gastric ulcer  CURRENT THERAPY: Comfort care, transfusions as needed. Aranesp 500 mcg every 3 weeks. Vitamin B12 every 4 weeks. Blood transfusions PRN.  INTERVAL HISTORY: Lee Ramirez 79 y.o. 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  Lee Ramirez and his daughter do not want to pursue Hospice as they are already involved with [facility] and do not want to change care.  He wishes to continue with transfusions as needed and therefore prevents him from transitioning to Hospice.   Today, Lee Ramirez color is fair.  He is pale, but better than I had expected.   Lee Ramirez 2 complaints: 1. Epigastric pain.  He reports that he Ramirez abdominal pain in the epigastric region which he points to.  On palpation, he is noted to have increased pain to deep palpation.  I suspect he Ramirez a gastric ulcer and this will be treated with increased PPI and Carafate.  2. Burning with urination.  He admits to urinary urgency, dark urine, and burning with urination.  He denies any suprapubic pain, and foul smelling odor.  Due to his performance status and his condition and diagnosis, he is at increased risk for UTIs and infections.  With this information, I will treat empirically with Cipro 500 mg BID x 10 days.  I will not test a UA due to his poor performance status.  The patient reports that he saw his PCP recently and he was given  hand-written prescriptions for his other chronic illnesses.  According to the patient and his daughter, there was some issue with getting those prescriptions to the drug store in a fashion that would allow the family to pick up the scripts the same day.  As a result, they requested his PCP call in the Rxs after they were written and PCP refused, apparently in fear of abuse potential.  I told the patient and daughter to call me if that were to happen again for this poor gentleman who Ramirez a poor prognosis and I would be glad to call in medications for him.  Hematologically, he denies any complaints and ROS questioning is negative.        Past Medical History  Diagnosis Date  . ASCVD (arteriosclerotic cardiovascular disease)     Sizable non-Q. myocardial infarction in 09/2009; severe three-vessel disease with an ejection fraction of 35%; three-vessel DES complicated by thrombocytopenia and mild renal insufficiency  . Hyperlipidemia     09/2010: TC-151, TG-411, H.-32, L.-not calculated   . Anemia     H/H -12.3/37.2; MCV-103; platelets-114 (08/2010)   . 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 Hypertension   . Orthostatic hypotension     Lightheaded; no syncope  . Thrombocytopenia, acquired     09/2010-114,000  . Hepatic steatosis 2013  . Thrombocytopenia     referred to hem-onc 5/13  . Squamous cell skin cancer 10/2012    of nose  . Myelodysplastic syndrome   . Myelodysplastic syndrome  Ramirez Arteriosclerotic cardiovascular disease (ASCVD)-ischemic cardiomyopathy; Thrombocytopenia, acquired; Hyperlipidemia; Hypertension; Orthostatic hypotension; Anemia, macrocytic; Insomnia; GERD (gastroesophageal reflux disease); MDS (myelodysplastic syndrome) with 5q- syndrome; Cardiomyopathy; Weakness; and Chronic systolic CHF (congestive heart failure) on his problem list.     is allergic to aspirin and revlimid.  Lee Ramirez had no medications administered  during this visit.  Past Surgical History  Procedure Laterality Date  . Inguinal hernia repair      Right  . Appendectomy    . Coronary angioplasty with stent placement    . Bone marrow biopsy  02/19/12  . Bone marrow aspiration  02/19/12  . Skin cancer excision  10/2012    Denies any headaches, dizziness, double vision, fevers, chills, night sweats, nausea, vomiting, diarrhea, constipation, chest pain, heart palpitations, shortness of breath, blood in stool, black tarry stool, urinary pain, urinary burning, urinary frequency, hematuria.   PHYSICAL EXAMINATION  ECOG PERFORMANCE STATUS: 3 - Symptomatic, >50% confined to bed  Filed Vitals:   07/03/13 1500  BP: 94/61  Pulse: 96  Temp: 97.3 F (36.3 C)  Resp: 18    GENERAL:alert, no distress, pale and smiling SKIN: skin color, texture, turgor are normal, no rashes or significant lesions HEAD: Normocephalic, No masses, lesions, tenderness or abnormalities EYES: normal, PERRLA, EOMI, Conjunctiva are pink and non-injected EARS: External ears normal OROPHARYNX:mucous membranes are moist  NECK: supple, trachea midline LYMPH:  not examined BREAST:not examined LUNGS: clear to auscultation  HEART: regular rate & rhythm, no murmurs and no gallops ABDOMEN:abdomen soft, normal bowel sounds and tenderness in epigastric region BACK: Back symmetric, no curvature. EXTREMITIES:less then 2 second capillary refill, no edema, no clubbing, no cyanosis  NEURO: alert & oriented x 3 with fluent speech, no focal motor/sensory deficits, in wheelchair    LABORATORY DATA: CBC    Component Value Date/Time   WBC 8.6 06/19/2013 1058   RBC 2.29* 06/19/2013 1058   RBC 2.71* 01/07/2012 1630   HGB 7.0* 06/19/2013 1058   HCT 22.1* 06/19/2013 1058   PLT 96* 06/19/2013 1058   MCV 96.5 06/19/2013 1058   MCH 30.6 06/19/2013 1058   MCHC 31.7 06/19/2013 1058   RDW 20.0* 06/19/2013 1058   LYMPHSABS 1.9 06/19/2013 1058   MONOABS 0.8 06/19/2013 1058   EOSABS  0.6 06/19/2013 1058   BASOSABS 0.0 06/19/2013 1058      ASSESSMENT:  1. 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  2. Recurrent falls, with injury to right thorax. 3. Epigastric pain, likely gastric ulcer 4. Urinary burning, suspect a UTI  Patient Active Problem List   Diagnosis Date Noted  . Chronic systolic CHF (congestive heart failure) 04/06/2013  . Cardiomyopathy 02/27/2013  . Weakness 02/27/2013  . MDS (myelodysplastic syndrome) with 5q- syndrome 03/19/2012  . GERD (gastroesophageal reflux disease) 11/24/2011  . Insomnia 11/15/2011  . Thrombocytopenia, acquired   . Hyperlipidemia   . Hypertension   . Orthostatic hypotension   . Anemia, macrocytic   . Arteriosclerotic cardiovascular disease (ASCVD)-ischemic cardiomyopathy 11/21/2009     PLAN:  1. I personally reviewed and went over laboratory results with the patient. 2. .Labs today: CBC diff  3. Aranesp supportive therapy plan updated and reviewed  4. Aranesp 500 mcg today and then every 21 days 5. Vitamin B12 supportive therapy plan reviewed and updated  6. Vitamin B12 injection today 7. Labs every 3 weeks: CBC diff 8. Pink-top tube drawn today. 9. Supportive therapy plan reviewed and updated.  10. Rx for Carafate 11. Rx for Prilosec 20 mg BID 12. Rx for Cipro 500 mg BID x 10 days for empirical treatment of UTI. 13. 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   All questions were answered. The patient knows to call the clinic with any problems, questions or concerns. We can certainly see the patient much sooner if necessary.  Patient and plan discussed with Dr. Annamarie Dawley and she is in agreement with the  aforementioned.   KEFALAS,THOMAS

## 2013-07-03 ENCOUNTER — Ambulatory Visit (HOSPITAL_COMMUNITY): Payer: Medicare Other

## 2013-07-03 ENCOUNTER — Encounter (HOSPITAL_COMMUNITY): Payer: Self-pay | Admitting: Oncology

## 2013-07-03 ENCOUNTER — Encounter (HOSPITAL_BASED_OUTPATIENT_CLINIC_OR_DEPARTMENT_OTHER): Payer: Medicare Other | Admitting: Oncology

## 2013-07-03 VITALS — BP 94/61 | HR 96 | Temp 97.3°F | Resp 18 | Wt 159.4 lb

## 2013-07-03 DIAGNOSIS — R399 Unspecified symptoms and signs involving the genitourinary system: Secondary | ICD-10-CM

## 2013-07-03 DIAGNOSIS — R3989 Other symptoms and signs involving the genitourinary system: Secondary | ICD-10-CM

## 2013-07-03 DIAGNOSIS — K259 Gastric ulcer, unspecified as acute or chronic, without hemorrhage or perforation: Secondary | ICD-10-CM

## 2013-07-03 DIAGNOSIS — K219 Gastro-esophageal reflux disease without esophagitis: Secondary | ICD-10-CM

## 2013-07-03 DIAGNOSIS — D46C Myelodysplastic syndrome with isolated del(5q) chromosomal abnormality: Secondary | ICD-10-CM

## 2013-07-03 DIAGNOSIS — D469 Myelodysplastic syndrome, unspecified: Secondary | ICD-10-CM

## 2013-07-03 DIAGNOSIS — E538 Deficiency of other specified B group vitamins: Secondary | ICD-10-CM

## 2013-07-03 LAB — CBC WITH DIFFERENTIAL/PLATELET
Basophils Absolute: 0 10*3/uL (ref 0.0–0.1)
Basophils Relative: 1 % (ref 0–1)
Lymphocytes Relative: 48 % — ABNORMAL HIGH (ref 12–46)
MCH: 31.7 pg (ref 26.0–34.0)
MCHC: 32.9 g/dL (ref 30.0–36.0)
Monocytes Relative: 6 % (ref 3–12)
Neutro Abs: 3.2 10*3/uL (ref 1.7–7.7)
Neutrophils Relative %: 41 % — ABNORMAL LOW (ref 43–77)
Platelets: 68 10*3/uL — ABNORMAL LOW (ref 150–400)
RDW: 20 % — ABNORMAL HIGH (ref 11.5–15.5)
WBC: 8 10*3/uL (ref 4.0–10.5)

## 2013-07-03 MED ORDER — CYANOCOBALAMIN 1000 MCG/ML IJ SOLN
1000.0000 ug | Freq: Once | INTRAMUSCULAR | Status: AC
  Administered 2013-07-03: 1000 ug via INTRAMUSCULAR

## 2013-07-03 MED ORDER — DARBEPOETIN ALFA-POLYSORBATE 500 MCG/ML IJ SOLN
INTRAMUSCULAR | Status: AC
  Filled 2013-07-03: qty 1

## 2013-07-03 MED ORDER — CIPROFLOXACIN HCL 500 MG PO TABS: 500.0000 mg | ORAL_TABLET | Freq: Two times a day (BID) | ORAL | Status: DC

## 2013-07-03 MED ORDER — CYANOCOBALAMIN 1000 MCG/ML IJ SOLN
INTRAMUSCULAR | Status: AC
  Filled 2013-07-03: qty 1

## 2013-07-03 MED ORDER — OMEPRAZOLE 20 MG PO CPDR: 20.0000 mg | DELAYED_RELEASE_CAPSULE | Freq: Two times a day (BID) | ORAL | Status: DC

## 2013-07-03 MED ORDER — DARBEPOETIN ALFA-POLYSORBATE 500 MCG/ML IJ SOLN
500.0000 ug | Freq: Once | INTRAMUSCULAR | Status: AC
  Administered 2013-07-03: 500 ug via SUBCUTANEOUS

## 2013-07-03 MED ORDER — SUCRALFATE 1 GM/10ML PO SUSP: 1.0000 g | Freq: Three times a day (TID) | ORAL | Status: DC

## 2013-07-03 NOTE — Patient Instructions (Signed)
[facility] Discharge Instructions  RECOMMENDATIONS MADE BY THE CONSULTANT AND ANY TEST RESULTS WILL BE SENT TO YOUR REFERRING PHYSICIAN.  EXAM FINDINGS BY THE PHYSICIAN TODAY AND SIGNS OR SYMPTOMS TO REPORT TO CLINIC OR PRIMARY PHYSICIAN: Exam and findings as discussed by Dellis Anes, PA-C.  Will check your blood counts today.    MEDICATIONS PRESCRIBED:  Cipro 500 mg twice daily as directed Prilosec 20 mg twice daily as directed Carafate 4 times daily as directed  INSTRUCTIONS/FOLLOW-UP: Return in 3 weeks.  Thank you for choosing [facility] to provide your oncology and hematology care.  To afford each patient quality time with our providers, please arrive at least 15 minutes before your scheduled appointment time.  With your help, our goal is to use those 15 minutes to complete the necessary work-up to ensure our physicians have the information they need to help with your evaluation and healthcare recommendations.    Effective January 1st, 2014, we ask that you re-schedule your appointment with our physicians should you arrive 10 or more minutes late for your appointment.  We strive to give you quality time with our providers, and arriving late affects you and other patients whose appointments are after yours.    Again, thank you for choosing [facility].  Our hope is that these requests will decrease the amount of time that you wait before being seen by our physicians.       _____________________________________________________________  Should you have questions after your visit to [facility], please contact our office at [phone] between the hours of 8:30 a.m. and 5:00 p.m.  Voicemails left after 4:30 p.m. will not be returned until the following business day.  For prescription refill requests, have your pharmacy contact our office with your prescription refill request.

## 2013-07-03 NOTE — Progress Notes (Signed)
Lee Ramirez presented for labwork. Labs per MD order drawn via Peripheral Line 23 gauge needle inserted in left AC  Good blood return present. Procedure without incident.  Needle removed intact. Patient tolerated procedure well.  Lee Ramirez presents today for injection per MD orders. Vitamin B12 given subcutaneous left arm Aranesp administered SQ in left Abdomen.   Administration without incident. Patient tolerated well.

## 2013-07-10 ENCOUNTER — Other Ambulatory Visit: Payer: Self-pay | Admitting: Cardiovascular Disease

## 2013-07-23 ENCOUNTER — Encounter (HOSPITAL_COMMUNITY): Payer: Self-pay | Admitting: Emergency Medicine

## 2013-07-23 ENCOUNTER — Observation Stay (HOSPITAL_COMMUNITY)
Admission: EM | Admit: 2013-07-23 | Discharge: 2013-07-24 | Disposition: A | Discharge status: Home / Self Care | Payer: Medicare Other | Attending: Internal Medicine | Admitting: Internal Medicine

## 2013-07-23 ENCOUNTER — Emergency Department (HOSPITAL_COMMUNITY): Payer: Medicare Other

## 2013-07-23 DIAGNOSIS — D469 Myelodysplastic syndrome, unspecified: Secondary | ICD-10-CM | POA: Insufficient documentation

## 2013-07-23 DIAGNOSIS — I5021 Acute systolic (congestive) heart failure: Secondary | ICD-10-CM | POA: Insufficient documentation

## 2013-07-23 DIAGNOSIS — D696 Thrombocytopenia, unspecified: Secondary | ICD-10-CM | POA: Insufficient documentation

## 2013-07-23 DIAGNOSIS — R5381 Other malaise: Secondary | ICD-10-CM | POA: Insufficient documentation

## 2013-07-23 DIAGNOSIS — R531 Weakness: Secondary | ICD-10-CM | POA: Diagnosis present

## 2013-07-23 DIAGNOSIS — I509 Heart failure, unspecified: Secondary | ICD-10-CM | POA: Insufficient documentation

## 2013-07-23 DIAGNOSIS — D649 Anemia, unspecified: Principal | ICD-10-CM | POA: Insufficient documentation

## 2013-07-23 DIAGNOSIS — D6959 Other secondary thrombocytopenia: Secondary | ICD-10-CM

## 2013-07-23 DIAGNOSIS — E785 Hyperlipidemia, unspecified: Secondary | ICD-10-CM | POA: Insufficient documentation

## 2013-07-23 LAB — CBC WITH DIFFERENTIAL/PLATELET
Basophils Relative: 1 % (ref 0–1)
Eosinophils Absolute: 0.1 10*3/uL (ref 0.0–0.7)
Eosinophils Relative: 2 % (ref 0–5)
HCT: 17.4 % — ABNORMAL LOW (ref 39.0–52.0)
Hemoglobin: 5.6 g/dL — CL (ref 13.0–17.0)
Lymphocytes Relative: 45 % (ref 12–46)
Lymphs Abs: 3.2 10*3/uL (ref 0.7–4.0)
Monocytes Absolute: 0.3 10*3/uL (ref 0.1–1.0)
Monocytes Relative: 4 % (ref 3–12)
Neutro Abs: 3.5 10*3/uL (ref 1.7–7.7)
Neutrophils Relative %: 48 % (ref 43–77)
RBC: 1.7 MIL/uL — ABNORMAL LOW (ref 4.22–5.81)

## 2013-07-23 LAB — COMPREHENSIVE METABOLIC PANEL
ALT: 8 U/L (ref 0–53)
AST: 16 U/L (ref 0–37)
Alkaline Phosphatase: 478 U/L — ABNORMAL HIGH (ref 39–117)
CO2: 24 mEq/L (ref 19–32)
Calcium: 8.5 mg/dL (ref 8.4–10.5)
Chloride: 106 mEq/L (ref 96–112)
Creatinine, Ser: 1.18 mg/dL (ref 0.50–1.35)
GFR calc non Af Amer: 53 mL/min — ABNORMAL LOW (ref >90)
Glucose, Bld: 112 mg/dL — ABNORMAL HIGH (ref 70–99)
Potassium: 4.6 mEq/L (ref 3.5–5.1)
Sodium: 139 mEq/L (ref 135–145)

## 2013-07-23 LAB — TROPONIN I: Troponin I: <0.3 ng/mL (ref <0.30)

## 2013-07-23 MED ORDER — SODIUM CHLORIDE 0.9 % IV SOLN: 250.0000 mL | INTRAVENOUS | Status: DC | PRN

## 2013-07-23 MED ORDER — SODIUM CHLORIDE 0.9 % IJ SOLN: 3.0000 mL | INTRAMUSCULAR | Status: DC | PRN

## 2013-07-23 MED ORDER — ACETAMINOPHEN 500 MG PO TABS
1000.0000 mg | ORAL_TABLET | Freq: Once | ORAL | Status: AC
  Administered 2013-07-23: 1000 mg via ORAL
  Filled 2013-07-23: qty 2

## 2013-07-23 MED ORDER — ACETAMINOPHEN 650 MG RE SUPP: 650.0000 mg | Freq: Four times a day (QID) | RECTAL | Status: DC | PRN

## 2013-07-23 MED ORDER — SODIUM CHLORIDE 0.9 % IJ SOLN
3.0000 mL | Freq: Two times a day (BID) | INTRAMUSCULAR | Status: DC
  Administered 2013-07-24: 3 mL via INTRAVENOUS

## 2013-07-23 MED ORDER — ALLOPURINOL 300 MG PO TABS
300.0000 mg | ORAL_TABLET | Freq: Every evening | ORAL | Status: DC
  Administered 2013-07-23: 300 mg via ORAL
  Filled 2013-07-23: qty 1

## 2013-07-23 MED ORDER — DIPHENHYDRAMINE HCL 50 MG/ML IJ SOLN
25.0000 mg | Freq: Once | INTRAMUSCULAR | Status: AC
  Administered 2013-07-23: 25 mg via INTRAVENOUS
  Filled 2013-07-23: qty 1

## 2013-07-23 MED ORDER — ONDANSETRON HCL 4 MG PO TABS: 4.0000 mg | ORAL_TABLET | Freq: Four times a day (QID) | ORAL | Status: DC | PRN

## 2013-07-23 MED ORDER — ONDANSETRON HCL 4 MG/2ML IJ SOLN: 4.0000 mg | Freq: Four times a day (QID) | INTRAMUSCULAR | Status: DC | PRN

## 2013-07-23 MED ORDER — ACETAMINOPHEN 325 MG PO TABS
650.0000 mg | ORAL_TABLET | Freq: Once | ORAL | Status: AC
  Administered 2013-07-23: 650 mg via ORAL
  Filled 2013-07-23: qty 2

## 2013-07-23 MED ORDER — ALBUTEROL SULFATE (5 MG/ML) 0.5% IN NEBU: 2.5000 mg | INHALATION_SOLUTION | RESPIRATORY_TRACT | Status: DC | PRN

## 2013-07-23 MED ORDER — SODIUM CHLORIDE 0.9 % IV BOLUS (SEPSIS)
500.0000 mL | Freq: Once | INTRAVENOUS | Status: AC
  Administered 2013-07-23: 500 mL via INTRAVENOUS

## 2013-07-23 MED ORDER — ACETAMINOPHEN 325 MG PO TABS
650.0000 mg | ORAL_TABLET | Freq: Four times a day (QID) | ORAL | Status: DC | PRN
  Administered 2013-07-24: 650 mg via ORAL
  Filled 2013-07-23: qty 2

## 2013-07-23 MED ORDER — FUROSEMIDE 10 MG/ML IJ SOLN
20.0000 mg | Freq: Once | INTRAMUSCULAR | Status: AC
  Administered 2013-07-23: 20 mg via INTRAVENOUS
  Filled 2013-07-23: qty 2

## 2013-07-23 MED ORDER — FUROSEMIDE 10 MG/ML IJ SOLN
40.0000 mg | Freq: Every day | INTRAMUSCULAR | Status: DC
  Administered 2013-07-24: 40 mg via INTRAVENOUS
  Filled 2013-07-23: qty 4

## 2013-07-23 MED ORDER — FUROSEMIDE 10 MG/ML IJ SOLN
20.0000 mg | Freq: Once | INTRAMUSCULAR | Status: AC
  Administered 2013-07-24: 20 mg via INTRAVENOUS
  Filled 2013-07-23: qty 2

## 2013-07-23 MED ORDER — LATANOPROST 0.005 % OP SOLN
OPHTHALMIC | Status: AC
  Filled 2013-07-23: qty 2.5

## 2013-07-23 MED ORDER — SIMVASTATIN 20 MG PO TABS: 20.0000 mg | ORAL_TABLET | Freq: Every day | ORAL | Status: DC

## 2013-07-23 MED ORDER — LATANOPROST 0.005 % OP SOLN
1.0000 [drp] | Freq: Every day | OPHTHALMIC | Status: DC
  Administered 2013-07-23: 1 [drp] via OPHTHALMIC
  Filled 2013-07-23: qty 2.5

## 2013-07-23 MED ORDER — ZOLPIDEM TARTRATE 5 MG PO TABS
5.0000 mg | ORAL_TABLET | Freq: Every evening | ORAL | Status: DC | PRN
  Administered 2013-07-24: 5 mg via ORAL
  Filled 2013-07-23: qty 1

## 2013-07-23 NOTE — ED Notes (Signed)
Pt appears close to baseline as reported by daughter at bedside except that the pts home nurse discovered orthostatic hypertension and called DR Timoteo [facility]?  And they said to bring pt to ED for check out.   Pt reports dizziness and weakness more than usual today and is poor historian. Pt has not eaten today.   Last BM 07/23/13.

## 2013-07-23 NOTE — H&P (Signed)
PATIENT DETAILS Name: Lee Ramirez Age: 79 y.o. Sex: male Date of Birth: 1922-06-01 Admit Date: 07/23/2013 ZOX:WRUEAVW,UJWJXBJ M, MD   CHIEF COMPLAINT:  Worsening shortness of breath 2 days  HPI: Lee Ramirez is a 79 y.o. male with a Past Medical History of chronic systolic heart failure, transfusion dependent severe myelodysplastic syndrome who presents today with the above noted complaint. Per patient, he was in his usual state of health until approximately 2 days ago when he noted much more shortness of breath than usual. Per family and patient, even walking a few feet go to the bathroom he is to make him short of breath. Overnight he developed some mild orthopnea and started using 2 pillows, he also woke up multiple times at night with shortness of breath. He was brought to the ED for further evaluation, hemoglobin was found to be 5.7. I was subsequently asked to admit this patient for further evaluation and treatment Patient denies any chest pain, leg edema. No history of fever, nausea, vomiting or diarrhea. No history of abdominal pain Per daughter at bedside, patient has had significant decline in his functional the past few months, very poor appetite and has lost a lot of weight. Patient is a DO NOT RESUSCITATE, and the family has tried hospice options, but were told that they could not quantify because of ongoing blood transfusion needs.   ALLERGIES:   Allergies  Allergen Reactions  . Aspirin Anaphylaxis  . Revlimid [Lenalidomide] Rash    PAST MEDICAL HISTORY: Past Medical History  Diagnosis Date  . ASCVD (arteriosclerotic cardiovascular disease)     Sizable non-Q. myocardial infarction in 09/2009; severe three-vessel disease with an ejection fraction of 35%; three-vessel DES complicated by thrombocytopenia and mild renal insufficiency  . Hyperlipidemia     09/2010: TC-151, TG-411, H.-32, L.-not calculated   . Anemia     H/H -12.3/37.2; MCV-103; platelets-114  (08/2010)   . 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 Hypertension   . Orthostatic hypotension     Lightheaded; no syncope  . Thrombocytopenia, acquired     09/2010-114,000  . Hepatic steatosis 2013  . Thrombocytopenia     referred to hem-onc 5/13  . Squamous cell skin cancer 10/2012    of nose  . Myelodysplastic syndrome   . Myelodysplastic syndrome     PAST SURGICAL HISTORY: Past Surgical History  Procedure Laterality Date  . Inguinal hernia repair      Right  . Appendectomy    . Coronary angioplasty with stent placement    . Bone marrow biopsy  02/19/12  . Bone marrow aspiration  02/19/12  . Skin cancer excision  10/2012  . Cardiac surgery      MEDICATIONS AT HOME: Prior to Admission medications   Medication Sig Start Date End Date Taking? Authorizing Provider  acetaminophen (TYLENOL) 500 MG tablet Take 1,000 mg by mouth every 6 (six) hours as needed. Pain   Yes Historical Provider, MD  albuterol (PROAIR HFA) 108 (90 BASE) MCG/ACT inhaler Inhale 2 puffs into the lungs every 6 (six) hours as needed. For shortness of breath   Yes Historical Provider, MD  allopurinol (ZYLOPRIM) 300 MG tablet Take 300 mg by mouth every evening.    Yes Historical Provider, MD  carvedilol (COREG) 6.25 MG tablet Take 6.25 mg by mouth 2 (two) times daily.   Yes Historical Provider, MD  CRANBERRY PO Take 1 capsule by mouth daily.   Yes Historical  Provider, MD  cyanocobalamin (,VITAMIN B-12,) 1000 MCG/ML injection Inject 1,000 mcg into the muscle every 30 (thirty) days.   Yes Historical Provider, MD  Multiple Vitamins-Iron (DAILY-VITAMIN/IRON PO) Take 1 tablet by mouth every morning.    Yes Historical Provider, MD  nitroGLYCERIN (NITROSTAT) 0.4 MG SL tablet Place 1 tablet (0.4 mg total) under the tongue every 5 (five) minutes as needed. 01/13/13  Yes Kathlen Brunswick, MD  ondansetron (ZOFRAN) 4 MG tablet Take 4 mg by mouth every 8 (eight) hours as needed for  nausea or vomiting.   Yes Historical Provider, MD  pravastatin (PRAVACHOL) 40 MG tablet Take 1 tablet (40 mg total) by mouth daily. 04/27/13  Yes Kathlen Brunswick, MD  Travoprost, BAK Free, (TRAVATAN) 0.004 % SOLN ophthalmic solution Place 1 drop into both eyes at bedtime.    Yes Historical Provider, MD  zolpidem (AMBIEN) 10 MG tablet Take 5 mg by mouth at bedtime as needed. For sleep   Yes Historical Provider, MD  meclizine (ANTIVERT) 12.5 MG tablet Take 12.5 mg by mouth 3 (three) times daily as needed. Dizziness    Historical Provider, MD    FAMILY HISTORY: History reviewed. No pertinent family history.  SOCIAL HISTORY:  reports that he has never smoked. He has never used smokeless tobacco. He reports that he does not drink alcohol or use illicit drugs.  REVIEW OF SYSTEMS:  Constitutional:   No  night sweats,  Fevers, chills, fatigue.  HEENT:    No headaches, Difficulty swallowing,Tooth/dental problems,Sore throat,  No sneezing, itching, ear ache, nasal congestion, post nasal drip,   Cardio-vascular: No chest pain,   swelling in lower extremities, anasarca,  dizziness, palpitations  GI:  No heartburn, indigestion, abdominal pain, nausea, vomiting, diarrhea, change in   bowel habits, loss of appetite  Resp: No shortness of breath at rest.  No excess mucus, no productive cough, No non-productive cough,  No coughing up of blood.No change in color of mucus.No wheezing.No chest wall deformity  Skin:  no rash or lesions.  GU:  no dysuria, change in color of urine, no urgency or frequency.  No flank pain.  Musculoskeletal: No joint pain or swelling.  No decreased range of motion.  No back pain.  Psych: No change in mood or affect. No depression or anxiety.  No memory loss.   PHYSICAL EXAM: Blood pressure 108/44, pulse 94, temperature 97.5 F (36.4 C), temperature source Oral, resp. rate 22, height 6\' 2"  (1.88 m), weight 77.111 kg (170 lb), SpO2 98.00%.  General appearance  :Awake, alert, not in any distress. Speech Clear. Not toxic Looking. Her very frail and weak looking. HEENT: Atraumatic and Normocephalic, pupils equally reactive to light and accomodation Neck: supple,+ JVD. No cervical lymphadenopathy.  Chest:Good air entry bilaterally, bibasilar rales CVS: S1 S2 regular, no murmurs.  Abdomen: Bowel sounds present, Non tender and not distended with no gaurding, rigidity or rebound. Extremities: B/L Lower Ext shows no edema, both legs are warm to touch Neurology: Awake alert, and oriented X 3, CN II-XII intact, Non focal Skin:No Rash Wounds:N/A  LABS ON ADMISSION:   Recent Labs  07/23/13 1605  NA 139  K 4.6  CL 106  CO2 24  GLUCOSE 112*  BUN 33*  CREATININE 1.18  CALCIUM 8.5    Recent Labs  07/23/13 1605  AST 16  ALT 8  ALKPHOS 478*  BILITOT 0.5  PROT 6.4  ALBUMIN 2.4*   No results found for this basename: LIPASE, AMYLASE,  in the  last 72 hours  Recent Labs  07/23/13 1605  WBC 7.2  NEUTROABS 3.5  HGB 5.6*  HCT 17.4*  MCV 102.4*  PLT 95*    Recent Labs  07/23/13 1605  TROPONINI <0.30   No results found for this basename: DDIMER,  in the last 72 hours No components found with this basename: POCBNP,    RADIOLOGIC STUDIES ON ADMISSION: Dg Chest Port 1 View  07/23/2013   CLINICAL DATA:  Shortness of breath.  EXAM: PORTABLE CHEST - 1 VIEW  COMPARISON:  04/06/2013.  FINDINGS: Cardiomegaly with pulmonary vascular prominence noted. Bilateral pulmonary alveolar infiltrates are present. Pulmonary alveolar infiltrates are new from prior study. These findings are consistent with congestive heart failure and bilateral pulmonary edema. Bilateral pneumonia could also present this fashion. No pleural effusion or pneumothorax.  Sclerosis is noted about the left scapula, and the distal portions of both clavicles. The ribs are slightly sclerotic. Blastic metastatic disease , myelogenous, or metabolic process cannot be excluded. Degenerative  changes noted both shoulders and thoracic spine.  IMPRESSION: 1. Congestive heart failure with pulmonary alveolar edema. Bilateral pneumonia could present this fashion. 2. Dense bones. Blastic metastatic disease, myelogenous process, or metabolic process could present in this fashion.   Electronically Signed   By: Maisie Fus  Register   On: 07/23/2013 16:19     EKG: Independently reviewed. Normal sinus rhythm  ASSESSMENT AND PLAN: Present on Admission:  . Anemia - Secondary to myelodysplastic syndrome, we'll transfuse 2 units of PRBC. Repeat CBC in the morning.   . Acute systolic CHF (congestive heart failure) - Patient has exertional dyspnea, which is likely multifactorial and from anemia and decompensated CHF. Patient apparently is prone to hypotension, therefore will temporarily discontinue Coreg, and try and diurese him gently with Lasix. Daily weights with intake and output  . MDS (myelodysplastic syndrome) with 5q- syndrome - Transfusion dependent, per patient's family requires almost a monthly transfusions. Also getting injectable erythropoietin and vitamin B12.  - Family is aware of poor prognosis, do it at this time like to continue with transfusions as much as possible, didn't know that patient is nearing the end of his life, and this to see if hospice/palliative care would help in the outpatient setting. We'll ask case management to evaluate in the morning.   . Thrombocytopenia, acquired - Secondary to myelodysplastic syndrome   . Weakness - Secondary to failure to thrive/myelodysplastic syndrome  - PT eval   . History of coronary artery disease - Apart from shortness of breath, no chest pain. Not a candidate for any further workup. Will not cycle enzymes in the setting.  Marland Kitchen Hyperlipidemia - Continue statins   Further plan will depend as patient's clinical course evolves and further radiologic and laboratory data become available. Patient will be monitored closely.  DVT  Prophylaxis: SCD's  Code Status: DNR  Total time spent for admission equals 45 minutes.  [facility] Pager [phone]  If 7PM-7AM, please contact night-coverage [email or web address] Password [facility] 07/23/2013, 5:58 PM

## 2013-07-23 NOTE — ED Provider Notes (Signed)
CSN: [id number]     Arrival date & time 07/23/13  1513 History  This chart was scribed for Benny Lennert, MD by Bennett Scrape, ED Scribe. This patient was seen in room [facility] and the patient's care was started at 3:46 PM.   Chief Complaint  Patient presents with  . Dizziness  . Weakness    Patient is a 79 y.o. male presenting with shortness of breath. The history is provided by the patient. No language interpreter was used.  Shortness of Breath Severity:  Severe Onset quality:  Gradual Duration:  2 days Timing:  Constant Progression:  Worsening Chronicity:  Chronic Relieved by:  Nothing Worsened by:  Exertion Associated symptoms: abdominal pain   Associated symptoms: no chest pain, no cough, no headaches and no rash     HPI Comments: Lee Ramirez is a 79 y.o. male with a h/o leukemia who presents to the Emergency Department complaining of 2 days of worsening of chronic SOB with associated generalized weakness and diffuse abdominal pain. Pt reports that he has been only able to walk a few feet before having to rest. Family states that they f/u with his home health nurse who recommended that the pt be started on oxygen, but family became concerned today with sudden onset dizziness and BP dropping several points with standing. He has had prior blood transfusions, one a month for the past 5 months. He now has blood work once every few weeks to monitor hemoglobin levels. Pt also reports that the pt has not eaten or drank anything since last night due to weakness. He denies any cough as an associated symptom.  PCP is Dr. Regino Schultze Endocrinologist is with [facility].  Past Medical History  Diagnosis Date  . ASCVD (arteriosclerotic cardiovascular disease)     Sizable non-Q. myocardial infarction in 09/2009; severe three-vessel disease with an ejection fraction of 35%; three-vessel DES complicated by thrombocytopenia and mild renal insufficiency  . Hyperlipidemia     09/2010: TC-151,  TG-411, H.-32, L.-not calculated   . Anemia     H/H -12.3/37.2; MCV-103; platelets-114 (08/2010)   . 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 Hypertension   . Orthostatic hypotension     Lightheaded; no syncope  . Thrombocytopenia, acquired     09/2010-114,000  . Hepatic steatosis 2013  . Thrombocytopenia     referred to hem-onc 5/13  . Squamous cell skin cancer 10/2012    of nose  . Myelodysplastic syndrome   . Myelodysplastic syndrome    Past Surgical History  Procedure Laterality Date  . Inguinal hernia repair      Right  . Appendectomy    . Coronary angioplasty with stent placement    . Bone marrow biopsy  02/19/12  . Bone marrow aspiration  02/19/12  . Skin cancer excision  10/2012  . Cardiac surgery     History reviewed. No pertinent family history. History  Substance Use Topics  . Smoking status: Never Smoker   . Smokeless tobacco: Never Used  . Alcohol Use: No    Review of Systems  Constitutional: Negative for appetite change and fatigue.  HENT: Negative for congestion, ear discharge and sinus pressure.   Eyes: Negative for discharge.  Respiratory: Positive for shortness of breath. Negative for cough.   Cardiovascular: Negative for chest pain.  Gastrointestinal: Positive for abdominal pain. Negative for diarrhea.  Genitourinary: Negative for frequency and hematuria.  Musculoskeletal: Negative for back pain.  Skin:  Negative for rash.  Neurological: Positive for dizziness and weakness. Negative for seizures and headaches.  Psychiatric/Behavioral: Negative for hallucinations.    Allergies  Aspirin and Revlimid  Home Medications   Current Outpatient Rx  Name  Route  Sig  Dispense  Refill  . acetaminophen (TYLENOL) 500 MG tablet   Oral   Take 1,000 mg by mouth every 6 (six) hours as needed. Pain         . albuterol (PROAIR HFA) 108 (90 BASE) MCG/ACT inhaler   Inhalation   Inhale 2 puffs into the lungs every 6  (six) hours as needed. For shortness of breath         . allopurinol (ZYLOPRIM) 300 MG tablet   Oral   Take 300 mg by mouth daily.         . carvedilol (COREG) 6.25 MG tablet      TAKE 1 TABLET BY MOUTH TWICE DAILY WITH A MEAL.   180 tablet   3   . ciprofloxacin (CIPRO) 500 MG tablet   Oral   Take 1 tablet (500 mg total) by mouth 2 (two) times daily.   20 tablet   0   . CRANBERRY PO   Oral   Take 1 capsule by mouth daily.         . meclizine (ANTIVERT) 12.5 MG tablet   Oral   Take 12.5 mg by mouth 3 (three) times daily as needed. Dizziness         . Multiple Vitamins-Iron (DAILY-VITAMIN/IRON PO)   Oral   Take 1 tablet by mouth daily.         . nitroGLYCERIN (NITROSTAT) 0.4 MG SL tablet   Sublingual   Place 1 tablet (0.4 mg total) under the tongue every 5 (five) minutes as needed.   25 tablet   3   . omeprazole (PRILOSEC) 20 MG capsule   Oral   Take 1 capsule (20 mg total) by mouth 2 (two) times daily before a meal.   60 capsule   5   . ondansetron (ZOFRAN) 4 MG tablet   Oral   Take 4 mg by mouth every 8 (eight) hours as needed for nausea or vomiting.         . pravastatin (PRAVACHOL) 40 MG tablet   Oral   Take 1 tablet (40 mg total) by mouth daily.   30 tablet   3   . sucralfate (CARAFATE) 1 GM/10ML suspension   Oral   Take 10 mLs (1 g total) by mouth 4 (four) times daily -  with meals and at bedtime.   420 mL   0   . Travoprost, BAK Free, (TRAVATAN) 0.004 % SOLN ophthalmic solution   Both Eyes   Place 1 drop into both eyes at bedtime.          . vitamin B-12 (CYANOCOBALAMIN) 100 MCG tablet   Oral   Take 3,000 mcg by mouth daily.          Marland Kitchen zolpidem (AMBIEN) 10 MG tablet   Oral   Take 5 mg by mouth at bedtime as needed. For sleep          Triage Vitals: BP 108/44  Pulse 94  Temp(Src) 97.5 F (36.4 C) (Oral)  Resp 22  Ht 6\' 2"  (1.88 m)  Wt 170 lb (77.111 kg)  BMI 21.82 kg/m2  SpO2 98%  Physical Exam  Nursing note and  vitals reviewed. Constitutional: He is oriented to person, place, and time.  He appears well-developed and well-nourished.  HENT:  Head: Normocephalic and atraumatic.  Dry MM  Eyes: Conjunctivae and EOM are normal. No scleral icterus.  Pale sclera   Neck: Neck supple. No thyromegaly present.  Cardiovascular: Normal rate and regular rhythm.  Exam reveals no gallop and no friction rub.   No murmur heard. Pulmonary/Chest: Effort normal. No stridor. He has no wheezes. He exhibits no tenderness.  Mild crackles bilaterally   Abdominal: He exhibits no distension. There is no tenderness. There is no rebound.  Musculoskeletal: Normal range of motion. He exhibits no edema.  Lymphadenopathy:    He has no cervical adenopathy.  Neurological: He is alert and oriented to person, place, and time. He exhibits normal muscle tone. Coordination normal.  Skin: Skin is warm and dry. No rash noted. No erythema. There is pallor.  Psychiatric: He has a normal mood and affect. His behavior is normal.    ED Course  Procedures (including critical care time)  Medications  sodium chloride 0.9 % bolus 500 mL (not administered)    DIAGNOSTIC STUDIES: Oxygen Saturation is 98% on room air, normal by my interpretation.    COORDINATION OF CARE: 3:49 PM-Discussed treatment plan which includes blood work with pt at bedside and pt agreed to plan.   Labs Review Labs Reviewed - No data to display Imaging Review No results found.  EKG Interpretation    Date/Time:  Thursday July 23 2013 16:26:48 EST Ventricular Rate:  91 PR Interval:  218 QRS Duration: 132 QT Interval:  408 QTC Calculation: 501 R Axis:   153 Text Interpretation:  Sinus rhythm with 1st degree A-V block Non-specific intra-ventricular conduction block Possible Anterolateral infarct (cited on or before 24-Nov-2011) T wave abnormality, consider inferior ischemia Abnormal ECG When compared with ECG of 27-Feb-2013 11:45, Significant changes have  occurred Confirmed by Malick Netz  MD, Kenyada Dosch ([id number]) on 07/23/2013 5:49:32 PM          CRITICAL CARE Performed by: Kenshawn Maciolek L Total critical care time: 35 Critical care time was exclusive of separately billable procedures and treating other patients. Critical care was necessary to treat or prevent imminent or life-threatening deterioration. Critical care was time spent personally by me on the following activities: development of treatment plan with patient and/or surrogate as well as nursing, discussions with consultants, evaluation of patient's response to treatment, examination of patient, obtaining history from patient or surrogate, ordering and performing treatments and interventions, ordering and review of laboratory studies, ordering and review of radiographic studies, pulse oximetry and re-evaluation of patient's condition.   MDM  Anemia admit The chart was scribed for me under my direct supervision.  I personally performed the history, physical, and medical decision making and all procedures in the evaluation of this patient.Benny Lennert, MD 07/24/13 [phone]

## 2013-07-23 NOTE — ED Notes (Signed)
Dr Estell Harpin gave verbal order to stop 500 ml bolus due to overload. Fluids changed from bolus to KVO at 25 ml/hr. Primary RNs Danelle Earthly and [place] notified of lab values and MD orders.

## 2013-07-23 NOTE — ED Notes (Signed)
CRITICAL VALUE ALERT  Critical value received:  Hgb and Hct   Date of notification:  07/23/13  Time of notification:  1645  Critical value read back:yes  Nurse who received alert:  Lake Bells, RN  MD notified (1st page):  Dr Estell Harpin  Time of first page:  [phone]

## 2013-07-23 NOTE — Progress Notes (Signed)
-  Hospitalized, seen in hospital.  Will set-up for weekly CBC and follow-up visit in 3 weeks.  Will transfuse as necessary given patient's decline of Hospice services.Dellis Anes

## 2013-07-24 ENCOUNTER — Ambulatory Visit (HOSPITAL_COMMUNITY): Payer: Medicare Other | Admitting: Oncology

## 2013-07-24 DIAGNOSIS — D469 Myelodysplastic syndrome, unspecified: Secondary | ICD-10-CM

## 2013-07-24 DIAGNOSIS — D46C Myelodysplastic syndrome with isolated del(5q) chromosomal abnormality: Secondary | ICD-10-CM

## 2013-07-24 LAB — BASIC METABOLIC PANEL
CO2: 22 mEq/L (ref 19–32)
Calcium: 8.5 mg/dL (ref 8.4–10.5)
Chloride: 105 mEq/L (ref 96–112)
Creatinine, Ser: 1.24 mg/dL (ref 0.50–1.35)
GFR calc Af Amer: 58 mL/min — ABNORMAL LOW (ref >90)
Glucose, Bld: 106 mg/dL — ABNORMAL HIGH (ref 70–99)
Potassium: 4.1 mEq/L (ref 3.5–5.1)
Sodium: 139 mEq/L (ref 135–145)

## 2013-07-24 LAB — HEMOGLOBIN AND HEMATOCRIT, BLOOD
HCT: 23.6 % — ABNORMAL LOW (ref 39.0–52.0)
Hemoglobin: 8 g/dL — ABNORMAL LOW (ref 13.0–17.0)

## 2013-07-24 LAB — CBC
MCV: 94.7 fL (ref 78.0–100.0)
Platelets: 82 10*3/uL — ABNORMAL LOW (ref 150–400)
RBC: 2.63 MIL/uL — ABNORMAL LOW (ref 4.22–5.81)
WBC: 7.7 10*3/uL (ref 4.0–10.5)

## 2013-07-24 MED ORDER — ALBUTEROL SULFATE HFA 108 (90 BASE) MCG/ACT IN AERS: 2.0000 | INHALATION_SPRAY | RESPIRATORY_TRACT | Status: AC | PRN

## 2013-07-24 MED ORDER — POTASSIUM CHLORIDE ER 10 MEQ PO TBCR: 20.0000 meq | EXTENDED_RELEASE_TABLET | Freq: Every day | ORAL | Status: AC

## 2013-07-24 MED ORDER — CARVEDILOL 6.25 MG PO TABS: 6.2500 mg | ORAL_TABLET | Freq: Two times a day (BID) | ORAL | Status: AC

## 2013-07-24 MED ORDER — FUROSEMIDE 40 MG PO TABS: 40.0000 mg | ORAL_TABLET | Freq: Every day | ORAL | Status: AC

## 2013-07-24 NOTE — Discharge Summary (Addendum)
Physician Discharge Summary  Lee Ramirez ZOX:[id number] DOB: 01/09/1922 DOA: 07/23/2013  PCP: [name], MD  Admit date: 07/23/2013 Discharge date: 07/24/2013  Time spent: 40 minutes  Recommendations for Outpatient Follow-up:  May need to transition to full comfort care at some point. Can only get hospice care here in [facility] if family is agreeable to stop blood transfusion.  Discharge Diagnoses:  Principal Problem:   Anemia Active Problems:   Thrombocytopenia, acquired   Hyperlipidemia   MDS (myelodysplastic syndrome) with 5q- syndrome   Weakness   Acute systolic CHF (congestive heart failure)   Discharge Condition: fair  Diet recommendation: as tolerated  Filed Weights   07/23/13 1513 07/23/13 1917 07/24/13 0500  Weight: 77.111 kg (170 lb) 77.1 kg (169 lb 15.6 oz) 77.2 kg (170 lb 3.1 oz)    History of present illness:  Lee Ramirez is a 79 y.o. male with a Past Medical History of chronic systolic heart failure, transfusion dependent severe myelodysplastic syndrome who presented on 07/23/13 cc worsening SOB. Per patient, he was in his usual state of health until approximately 2 days prior when he noted much more shortness of breath than usual. Per family and patient, even walking a few feet to go to the bathroom made him short of breath. Overnight he developed some mild orthopnea and started using 2 pillows, he also woke up multiple times during night with shortness of breath. He was brought to the ED for further evaluation, hemoglobin was found to be 5.7. Patient denied any chest pain, leg edema. No history of fever, nausea, vomiting or diarrhea. No history of abdominal pain Per daughter at bedside, patient has had significant decline in his functional the past few months, very poor appetite and has lost a lot of weight. Patient is a DO NOT RESUSCITATE, and the family has tried hospice options, but were told that they could not quantify because of ongoing  blood transfusion needs.   Hospital Course:  . Marland Kitchen Anemia  - Secondary to myelodysplastic syndrome. S/P 2 units PRBC's. Hg 8.0 on discharge. Continue followup at cancer Center.  . Acute systolic CHF (congestive heart failure)  - Patient has exertional dyspnea, which is likely multifactorial and from anemia and decompensated CHF. Improved this am. Patient reportedly prone to hypotension, therefore Coreg held and lasix given to try and diurese him gently. Volume status unclear as no intake and output documented. Wt 77.1kg. Will continue with Lasix on discharge for 3 more days, then discontinue and resume coreg .   - Spoke with patient's cardiologist-Dr. Charlsie Quest who is agreeable with the above - Please note, patient has not tolerated ACE inhibitor in the past due to hypotension.  . MDS (myelodysplastic syndrome) with 5q- syndrome  - Transfusion dependent, per patient's family requires almost a monthly transfusions. Also getting injectable erythropoietin and vitamin B12.  Family is aware of poor prognosis. At this time would like to continue with transfusions as much as possible. End of life goals discussed. Oncology consult requested per request. Pt currently not hospice candidate due to continued transfusions. - Reevaluated this morning-patient's daughter still would want to continue with transfusions and not ready to transition to full comfort care    . Thrombocytopenia, acquired  - Secondary to myelodysplastic syndrome   . Weakness  - Secondary to failure to thrive/myelodysplastic syndrome  - PT eval who recommended HH PT. Question benefit of this given prognosis  . History of coronary artery disease  - Apart from shortness of breath, no  chest pain. Not a candidate for any further workup. Will not cycle enzymes in the setting.   Marland Kitchen Hyperlipidemia  - Continue statins   Procedures: Consultations:  Oncology  Discharge Exam: Filed Vitals:   07/24/13 0836  BP:   Pulse: 87  Temp:   Resp:  16    General: pale frail clearly near end of life. Alert NAD Cardiovascular: RRR No MGR No LE edeam Respiratory: normal effort BS with fine crackle particularly bases. No wheeze  Discharge Instructions       Future Appointments Provider Department Dept Phone   07/31/2013 2:10 PM Jodelle Gross, NP [facility] [phone]       Medication List         acetaminophen 500 MG tablet  Commonly known as:  TYLENOL  Take 1,000 mg by mouth every 6 (six) hours as needed. Pain     allopurinol 300 MG tablet  Commonly known as:  ZYLOPRIM  Take 300 mg by mouth every evening.     carvedilol 6.25 MG tablet  Commonly known as:  COREG  Take 1 tablet (6.25 mg total) by mouth 2 (two) times daily. Resume on 07/28/13.     CRANBERRY PO  Take 1 capsule by mouth daily.     cyanocobalamin 1000 MCG/ML injection  Commonly known as:  (VITAMIN B-12)  Inject 1,000 mcg into the muscle every 30 (thirty) days.     DAILY-VITAMIN/IRON PO  Take 1 tablet by mouth every morning.     furosemide 40 MG tablet  Commonly known as:  LASIX  Take 1 tablet (40 mg total) by mouth daily.     meclizine 12.5 MG tablet  Commonly known as:  ANTIVERT  Take 12.5 mg by mouth 3 (three) times daily as needed. Dizziness     nitroGLYCERIN 0.4 MG SL tablet  Commonly known as:  NITROSTAT  Place 1 tablet (0.4 mg total) under the tongue every 5 (five) minutes as needed.     ondansetron 4 MG tablet  Commonly known as:  ZOFRAN  Take 4 mg by mouth every 8 (eight) hours as needed for nausea or vomiting.     potassium chloride 10 MEQ tablet  Commonly known as:  K-DUR  Take 2 tablets (20 mEq total) by mouth daily.     pravastatin 40 MG tablet  Commonly known as:  PRAVACHOL  Take 1 tablet (40 mg total) by mouth daily.     PROAIR HFA 108 (90 BASE) MCG/ACT inhaler  Generic drug:  albuterol  Inhale 2 puffs into the lungs every 6 (six) hours as needed. For shortness of breath     albuterol 108 (90 BASE)  MCG/ACT inhaler  Commonly known as:  PROVENTIL HFA;VENTOLIN HFA  Inhale 2 puffs into the lungs every 4 (four) hours as needed for wheezing or shortness of breath.     Travoprost (BAK Free) 0.004 % Soln ophthalmic solution  Commonly known as:  TRAVATAN  Place 1 drop into both eyes at bedtime.     zolpidem 10 MG tablet  Commonly known as:  AMBIEN  Take 5 mg by mouth at bedtime as needed. For sleep       Allergies  Allergen Reactions  . Aspirin Anaphylaxis  . Revlimid [Lenalidomide] Rash   Follow-up Information   Please follow up.   Contact information:   [facility] [phone]      Follow up with [name], MD. Schedule an appointment as soon as possible for a visit in  2 weeks. (for evaluation of symptoms)    Specialty:  Family Medicine   Contact information:   [address] [place] [address] [phone]       Follow up with [facility].   Specialty:  Home Health Services   Contact information:   [address] [place] [address] [phone]       Follow up with [facility] On 07/31/2013. (Your appointment is at 2:10 pm)    Specialty:  Cardiology   Contact information:   [address] [place] [address] [phone]      Follow up with [name], MD. Schedule an appointment as soon as possible for a visit in 1 week.   Specialty:  Family Medicine   Contact information:   [address] [place] [address] [phone]       Follow up with [facility] In 1 week. (Labs every week)    Contact information:   [address] [place] [address]        The results of significant diagnostics from this hospitalization (including imaging, microbiology, ancillary and laboratory) are listed below for reference.    Significant Diagnostic Studies: Dg Chest Port 1 View  07/23/2013   CLINICAL DATA:  Shortness of breath.  EXAM:  PORTABLE CHEST - 1 VIEW  COMPARISON:  04/06/2013.  FINDINGS: Cardiomegaly with pulmonary vascular prominence noted. Bilateral pulmonary alveolar infiltrates are present. Pulmonary alveolar infiltrates are new from prior study. These findings are consistent with congestive heart failure and bilateral pulmonary edema. Bilateral pneumonia could also present this fashion. No pleural effusion or pneumothorax.  Sclerosis is noted about the left scapula, and the distal portions of both clavicles. The ribs are slightly sclerotic. Blastic metastatic disease , myelogenous, or metabolic process cannot be excluded. Degenerative changes noted both shoulders and thoracic spine.  IMPRESSION: 1. Congestive heart failure with pulmonary alveolar edema. Bilateral pneumonia could present this fashion. 2. Dense bones. Blastic metastatic disease, myelogenous process, or metabolic process could present in this fashion.   Electronically Signed   By: Maisie Fus  Register   On: 07/23/2013 16:19    Microbiology: No results found for this or any previous visit (from the past 240 hour(s)).   Labs: Basic Metabolic Panel:  Recent Labs Lab 07/23/13 1605 07/24/13 0359  NA 139 139  K 4.6 4.1  CL 106 105  CO2 24 22  GLUCOSE 112* 106*  BUN 33* 34*  CREATININE 1.18 1.24  CALCIUM 8.5 8.5   Liver Function Tests:  Recent Labs Lab 07/23/13 1605  AST 16  ALT 8  ALKPHOS 478*  BILITOT 0.5  PROT 6.4  ALBUMIN 2.4*   No results found for this basename: LIPASE, AMYLASE,  in the last 168 hours No results found for this basename: AMMONIA,  in the last 168 hours CBC:  Recent Labs Lab 07/23/13 1605 07/24/13 0359  WBC 7.2 7.7  NEUTROABS 3.5  --   HGB 5.6* 8.2*  8.0*  HCT 17.4* 24.9*  23.6*  MCV 102.4* 94.7  PLT 95* 82*   Cardiac Enzymes:  Recent Labs Lab 07/23/13 1605  TROPONINI <0.30   BNP: BNP (last 3 results)  Recent Labs  07/23/13 1605  PROBNP 15956.0*   CBG: No results found for this basename: GLUCAP,   in the last 168 hours     Signed:  Toya Smothers NP  [facility] 07/24/2013, 2:45 PM  Attending -  Patient seen and examined, the with the above assessment and plan. Much better after 2 units of PRBC transfusion, lungs mostly clear with a few bibasilar rales. Patient and family wanting to be discharged today, will continue with Lasix for 3 more days, following which this can be discontinued and he can go back on Coreg. Patient in the past cannot tolerate multiple antihypertensive agents at the same time-and has had severe hypotension. Has not tolerated ACE inhibitor in the past because of hypotension as well.  Very poor prognosis-unfortunately patient's daughter is not yet ready to transition to comfort care. Cannot be hospice eligible in [facility] as long as the family and patient continue to get blood transfusion.  Windell Norfolk MD

## 2013-07-24 NOTE — Progress Notes (Signed)
INITIAL NUTRITION ASSESSMENT  DOCUMENTATION CODES Per approved criteria  -Not Applicable   INTERVENTION: Provided CHF diet recommendation reveiw  NUTRITION DIAGNOSIS: Knowledge deficit related to Low Sodium diet as evidenced by limited food choices due to depends on daughter to provide meals.  Goal: Pt to meet >/= 90% of their estimated nutrition needs   Monitor:  Po meal and supplement  intake, labs and wt trends  Reason for Assessment: Malnutrition Screen Score = 3  79 y.o. male  Admitting Dx: Anemia  ASSESSMENT: Weight variable related to CHF?? He is in the process of being discharged home. He does not weigh himself daily. Reports he has a scale but doesn't use it. Reviewed diet hx. Eats cereal for breakfast, drinks Ensure for mid-morning snack, deli- sandwich for lunch and various restaurant foods for dinner which daughter brings home.  Patient Active Problem List   Diagnosis Date Noted  . Anemia 07/23/2013  . Acute systolic CHF (congestive heart failure) 07/23/2013  . Chronic systolic CHF (congestive heart failure) 04/06/2013  . Cardiomyopathy 02/27/2013  . Weakness 02/27/2013  . MDS (myelodysplastic syndrome) with 5q- syndrome 03/19/2012  . GERD (gastroesophageal reflux disease) 11/24/2011  . Insomnia 11/15/2011  . Thrombocytopenia, acquired   . Hyperlipidemia   . Hypertension   . Orthostatic hypotension   . Anemia, macrocytic   . Arteriosclerotic cardiovascular disease (ASCVD)-ischemic cardiomyopathy 11/21/2009    Height: Ht Readings from Last 1 Encounters:  07/23/13 6\' 2"  (1.88 m)    Weight: Wt Readings from Last 1 Encounters:  07/24/13 170 lb 3.1 oz (77.2 kg)    Ideal Body Weight: 190# (86.3 kg)  % Ideal Body Weight: 89%  Wt Readings from Last 10 Encounters:  07/24/13 170 lb 3.1 oz (77.2 kg)  07/03/13 159 lb 6.4 oz (72.303 kg)  06/19/13 158 lb 4.8 oz (71.804 kg)  05/14/13 161 lb (73.029 kg)  05/07/13 162 lb (73.483 kg)  04/15/13 165 lb 9.6 oz  (75.116 kg)  04/06/13 162 lb 7.7 oz (73.7 kg)  03/19/13 172 lb 4.8 oz (78.155 kg)  02/28/13 175 lb 4.3 oz (79.5 kg)  02/19/13 173 lb 14.4 oz (78.881 kg)    Usual Body Weight: pt doesn't know  BMI:  Body mass index is 21.84 kg/(m^2).normal range  Estimated Nutritional Needs: Kcal: 2150-2310 Protein: 85-98 gr Fluid: >2000 ml daily  Skin: no new issues  Diet Order: Cardiac  EDUCATION NEEDS: -No education needs identified at this time  No intake or output data in the 24 hours ending 07/24/13 1006  Last BM: 07/22/13  Labs:   Recent Labs Lab 07/23/13 1605 07/24/13 0359  NA 139 139  K 4.6 4.1  CL 106 105  CO2 24 22  BUN 33* 34*  CREATININE 1.18 1.24  CALCIUM 8.5 8.5  GLUCOSE 112* 106*    CBG (last 3)  No results found for this basename: GLUCAP,  in the last 72 hours  Scheduled Meds: . allopurinol  300 mg Oral QPM  . furosemide  40 mg Intravenous Daily  . latanoprost  1 drop Right Eye QHS  . simvastatin  20 mg Oral q1800  . sodium chloride  3 mL Intravenous Q12H  . sodium chloride  3 mL Intravenous Q12H    Continuous Infusions:   Past Medical History  Diagnosis Date  . ASCVD (arteriosclerotic cardiovascular disease)     Sizable non-Q. myocardial infarction in 09/2009; severe three-vessel disease with an ejection fraction of 35%; three-vessel DES complicated by thrombocytopenia and mild renal insufficiency  .  Hyperlipidemia     09/2010: TC-151, TG-411, H.-32, L.-not calculated   . Anemia     H/H -12.3/37.2; MCV-103; platelets-114 (08/2010)   . 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 Hypertension   . Orthostatic hypotension     Lightheaded; no syncope  . Thrombocytopenia, acquired     09/2010-114,000  . Hepatic steatosis 2013  . Thrombocytopenia     referred to hem-onc 5/13  . Squamous cell skin cancer 10/2012    of nose  . Myelodysplastic syndrome   . Myelodysplastic syndrome     Past Surgical History   Procedure Laterality Date  . Inguinal hernia repair      Right  . Appendectomy    . Coronary angioplasty with stent placement    . Bone marrow biopsy  02/19/12  . Bone marrow aspiration  02/19/12  . Skin cancer excision  10/2012  . Cardiac surgery      Royann Shivers MS,RD,CSG,LDN Office: #[phone] Pager: [phone]

## 2013-07-24 NOTE — Progress Notes (Signed)
UR chart review completed.  

## 2013-07-24 NOTE — Progress Notes (Addendum)
07/24/13 1535 Reviewed discharge instructions with patient, daughter [name] at bedside. Given copy of instructions, medication list, prescriptions, f/u appointment information. Noted when home medications next due on list. Patient and daughter verbalized understanding of instructions, when to call MD. IV site d/c'd per nurse tech, within normal limits.  No c/o pain or discomfort at time of discharge. Earnstine Regal, RN 07/24/13 1540 Home health to resume per [facility] and nurse aide through [facility] per Case Management. Pt and daughter aware. Patient left floor in stable condition via w/c accompanied by nurse tech. Discharged home. Earnstine Regal, RN

## 2013-07-24 NOTE — Care Management Note (Addendum)
    Page 1 of 2   07/24/2013     11:57:26 AM   CARE MANAGEMENT NOTE 07/24/2013  Patient:  JOSS, MCDILL   Account Number:  [id number]  Date Initiated:  07/24/2013  Documentation initiated by:  Sharrie Rothman  Subjective/Objective Assessment:   Pt admitted from home with weakness and anemia. Pt lives with his wife and daughter. Pt has a HH aide with [facility] 8.5 hours a day (aide is for pt and his wife). Pt has a BSC, walker, w/c, and cane.     Action/Plan:   CM contacted [facility] and they will resume services at discharge. Will send D/C summary once done. Pt for discharge today.   Anticipated DC Date:  07/24/2013   Anticipated DC Plan:  HOME W HOME HEALTH SERVICES      DC Planning Services  CM consult      [facility] Choice  Resumption Of Svcs/PTA Provider   Choice offered to / List presented to:  C-1 Patient        HH arranged  HH-4 NURSE'S AIDE  HH-1 RN  HH-2 PT      HH agency  [facility]   Status of service:  Completed, signed off Medicare Important Message given?  NA - LOS <3 / Initial given by admissions (If response is "NO", the following Medicare IM given date fields will be blank) Date Medicare IM given:   Date Additional Medicare IM given:    Discharge Disposition:  HOME W HOME HEALTH SERVICES  Per UR Regulation:    If discussed at Long Length of Stay Meetings, dates discussed:    Comments:  07/24/13 1155 Arlyss Queen, RN BSN CM Pt will have resumption of HH RN and PT with [facility]. Kristen Hollowell of [facility] made aware and will collect the pts information from the chart. Pt and pts nurse aware of discharge arrangements.  07/24/13 1100 Arlyss Queen, RN BSN CM

## 2013-07-24 NOTE — Consult Note (Signed)
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 is an 79 year old man who is well-known to the [facility] who was diagnosed with 5q- MDS.  He was initially treated with Revlimid, but developed a diffuse drug-rash that was grade 3-4.  This was discontinued and he was tried on Thalidomide with poor tolerance.  This was discontinued and he was noted to require more and more transfusions, indicative of transformation to acute leukemia possibly which is part of the natural history of 5q- MDS.    He was admitted on 07/23/2013 with anemia with a Hgb of 5.6 g/dL in conjunction with acute systolic congestive heart failure.  He was treated with 2 unit PRBC transfusion with an appropriate increase in Hgb.  Additionally, his CHF was treated appropriately as well.  His Hgb is noted to be 8.2 g/dL.  I broached the topic of Hospice again.  They respectfully declined.  Unfortunately, Kaige would greatly benefit from Hospice, but that would requiring ceasing transfusions.  His daughter is against that idea and today Lee Ramirez voices that he is not interested in Hospice.  We have spent time discussing the patient's diagnosis and prognosis.  We discussed the risks, benefits, alternatives, and potential complications of continued transfusions.  Despite this, they wish to push forward with symptomatic management and refuse Hospice again.   His daughter requests weekly labs which we can do.  We will get him set-up for weekly labs every Thursday at the [facility] affording [place] the opportunity to transfuse blood the following day on Friday.  We will see him in follow-up in three weeks.   Additionally,  she requests a refill on his ProAir inhaler.  I am unable to do that as he is admitted to the hospital and so I asked the hospitalist to prescribe and I will gladly refill as an outpatient.    She also asks about O2 at home.  We will monitor O2 sats as an outpatient and if deemed appropriate, will prescribe as an outpatient.   PAST MEDICAL HISTORY:   Past Medical History  Diagnosis Date  . ASCVD (arteriosclerotic cardiovascular disease)     Sizable non-Q. myocardial infarction in 09/2009; severe three-vessel disease with an ejection fraction of 35%; three-vessel DES complicated by thrombocytopenia and mild renal insufficiency  . Hyperlipidemia     09/2010: TC-151, TG-411, H.-32, L.-not calculated   . Anemia     H/H -12.3/37.2; MCV-103; platelets-114 (08/2010)   . 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 Hypertension   . Orthostatic hypotension     Lightheaded; no syncope  . Thrombocytopenia, acquired     09/2010-114,000  . Hepatic steatosis 2013  . Thrombocytopenia     referred to hem-onc 5/13  . Squamous cell skin cancer 10/2012    of nose  . Myelodysplastic syndrome   . Myelodysplastic syndrome     ALLERGIES: Allergies  Allergen Reactions  . Aspirin Anaphylaxis  . Revlimid [Lenalidomide] Rash      MEDICATIONS: I have reviewed the patient's current medications.     PAST SURGICAL HISTORY Past Surgical History  Procedure Laterality  Date  . Inguinal hernia repair      Right  . Appendectomy    . Coronary angioplasty with stent placement    . Bone marrow biopsy  02/19/12  . Bone marrow aspiration  02/19/12  . Skin cancer excision  10/2012  . Cardiac surgery      FAMILY HISTORY: History reviewed. No pertinent family history.  SOCIAL HISTORY:  reports that he has never smoked. He has never used smokeless tobacco. He reports that he does not drink alcohol or use illicit drugs.  PERFORMANCE STATUS: The patient's performance status is  3 - Symptomatic, >50% confined to bed  PHYSICAL EXAM: Most Recent Vital Signs: Blood pressure 103/68, pulse 87, temperature 97.4 F (36.3 C), temperature source Oral, resp. rate 16, height 6\' 2"  (1.88 m), weight 170 lb 3.1 oz (77.2 kg), SpO2 100.00%. Laying bed comfortably.  Multiple ecchymoses on UE.  Pale. Comfortable.  LABORATORY DATA:  Results for orders placed during the hospital encounter of 07/23/13 (from the past 48 hour(s))  CBC WITH DIFFERENTIAL     Status: Abnormal   Collection Time    07/23/13  4:05 PM      Result Value Range   WBC 7.2  4.0 - 10.5 K/uL   RBC 1.70 (*) 4.22 - 5.81 MIL/uL   Comment: RESULT REPEATED AND VERIFIED   Hemoglobin 5.6 (*) 13.0 - 17.0 g/dL   Comment: CRITICAL RESULT CALLED TO, READ BACK BY AND VERIFIED WITH:     OSBORNE,T. AT 1645 ON 07/23/2013 BY BAUGHAM,M.   HCT 17.4 (*) 39.0 - 52.0 %   Comment: RESULT REPEATED AND VERIFIED   MCV 102.4 (*) 78.0 - 100.0 fL   MCH 32.9  26.0 - 34.0 pg   MCHC 32.2  30.0 - 36.0 g/dL   RDW 40.9 (*) 81.1 - 91.4 %   Platelets 95 (*) 150 - 400 K/uL   Neutrophils Relative % 48  43 - 77 %   Lymphocytes Relative 45  12 - 46 %   Monocytes Relative 4  3 - 12 %   Eosinophils Relative 2  0 - 5 %   Basophils Relative 1  0 - 1 %   Neutro Abs 3.5  1.7 - 7.7 K/uL   Lymphs Abs 3.2  0.7 - 4.0 K/uL   Monocytes Absolute 0.3  0.1 - 1.0 K/uL   Eosinophils Absolute 0.1  0.0 - 0.7 K/uL   Basophils Absolute 0.1  0.0 - 0.1 K/uL   WBC Morphology ATYPICAL LYMPHOCYTES     Smear Review PLATELET COUNT CONFIRMED BY SMEAR     Comment: PLATELETS APPEAR DECREASED     LARGE PLATELETS PRESENT     GIANT PLATELETS SEEN  COMPREHENSIVE METABOLIC PANEL     Status: Abnormal   Collection Time    07/23/13  4:05 PM      Result Value Range   Sodium 139  135 - 145 mEq/L   Potassium 4.6  3.5 - 5.1 mEq/L   Chloride 106  96 - 112 mEq/L   CO2 24  19 - 32 mEq/L   Glucose, Bld 112 (*) 70 - 99 mg/dL   BUN 33 (*) 6 - 23 mg/dL   Creatinine, Ser 7.82  0.50  - 1.35 mg/dL   Calcium 8.5  8.4 - 95.6 mg/dL   Total Protein 6.4  6.0 - 8.3 g/dL   Albumin 2.4 (*) 3.5 - 5.2 g/dL   AST 16  0 - 37 U/L   ALT 8  0 - 53 U/L   Alkaline Phosphatase 478 (*) 39 - 117 U/L   Total Bilirubin 0.5  0.3 - 1.2 mg/dL   GFR calc non Af Amer 53 (*) >90 mL/min   GFR calc Af Amer 61 (*) >90 mL/min   Comment: (NOTE)     The eGFR has been calculated using the CKD EPI equation.     This calculation has not been validated in all clinical situations.     eGFR's persistently <90 mL/min signify possible Chronic Kidney     Disease.  PRO B NATRIURETIC PEPTIDE     Status: Abnormal   Collection Time    07/23/13  4:05 PM      Result Value Range   Pro B Natriuretic peptide (BNP) 15956.0 (*) 0 - 450 pg/mL  TROPONIN I     Status: None   Collection Time    07/23/13  4:05 PM      Result Value Range   Troponin I <0.30  <0.30 ng/mL   Comment:            Due to the release kinetics of cTnI,     a negative result within the first hours     of the onset of symptoms does not rule out     myocardial infarction with certainty.     If myocardial infarction is still suspected,     repeat the test at appropriate intervals.  TYPE AND SCREEN     Status: None   Collection Time    07/23/13  4:05 PM      Result Value Range   ABO/RH(D) O POS     Antibody Screen NEG     Sample Expiration 07/26/2013     Unit Number [id number]     Blood Component Type RBC, LR IRR     Unit division 00     Status of Unit ISSUED     Transfusion Status OK TO TRANSFUSE     Crossmatch Result Compatible     Unit Number [id number]     Blood Component Type RBC, LR IRR     Unit division 00     Status of Unit ISSUED,FINAL     Transfusion Status OK TO TRANSFUSE     Crossmatch Result Compatible    PREPARE RBC (CROSSMATCH)     Status: None   Collection Time    07/23/13  4:05 PM      Result Value Range   Order Confirmation ORDER PROCESSED BY BLOOD BANK    CBC     Status: Abnormal   Collection Time     07/24/13  3:59 AM      Result Value Range   WBC 7.7  4.0 - 10.5 K/uL   RBC 2.63 (*) 4.22 - 5.81 MIL/uL   Hemoglobin 8.2 (*) 13.0 - 17.0 g/dL   HCT 02.7 (*) 25.3 - 66.4 %   MCV 94.7  78.0 - 100.0 fL   Comment: DELTA CHECK NOTED   MCH 31.2  26.0 - 34.0 pg   MCHC 32.9  30.0 - 36.0 g/dL   RDW 40.3 (*) 47.4 - 25.9 %   Platelets 82 (*) 150 - 400 K/uL  BASIC METABOLIC PANEL     Status: Abnormal   Collection Time    07/24/13  3:59 AM      Result Value Range   Sodium 139  135 - 145 mEq/L   Potassium 4.1  3.5 - 5.1 mEq/L   Chloride 105  96 -  112 mEq/L   CO2 22  19 - 32 mEq/L   Glucose, Bld 106 (*) 70 - 99 mg/dL   BUN 34 (*) 6 - 23 mg/dL   Creatinine, Ser 9.56  0.50 - 1.35 mg/dL   Calcium 8.5  8.4 - 21.3 mg/dL   GFR calc non Af Amer 50 (*) >90 mL/min   GFR calc Af Amer 58 (*) >90 mL/min   Comment: (NOTE)     The eGFR has been calculated using the CKD EPI equation.     This calculation has not been validated in all clinical situations.     eGFR's persistently <90 mL/min signify possible Chronic Kidney     Disease.  HEMOGLOBIN AND HEMATOCRIT, BLOOD     Status: Abnormal   Collection Time    07/24/13  3:59 AM      Result Value Range   Hemoglobin 8.0 (*) 13.0 - 17.0 g/dL   Comment: DELTA CHECK NOTED   HCT 23.6 (*) 39.0 - 52.0 %      RADIOGRAPHY: Dg Chest Port 1 View  07/23/2013   CLINICAL DATA:  Shortness of breath.  EXAM: PORTABLE CHEST - 1 VIEW  COMPARISON:  04/06/2013.  FINDINGS: Cardiomegaly with pulmonary vascular prominence noted. Bilateral pulmonary alveolar infiltrates are present. Pulmonary alveolar infiltrates are new from prior study. These findings are consistent with congestive heart failure and bilateral pulmonary edema. Bilateral pneumonia could also present this fashion. No pleural effusion or pneumothorax.  Sclerosis is noted about the left scapula, and the distal portions of both clavicles. The ribs are slightly sclerotic. Blastic metastatic disease , myelogenous, or  metabolic process cannot be excluded. Degenerative changes noted both shoulders and thoracic spine.  IMPRESSION: 1. Congestive heart failure with pulmonary alveolar edema. Bilateral pneumonia could present this fashion. 2. Dense bones. Blastic metastatic disease, myelogenous process, or metabolic process could present in this fashion.   Electronically Signed   By: Maisie Fus  Register   On: 07/23/2013 16:19         ASSESSMENT:  1. 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  2. Recurrent falls, with injury to right thorax 3. Poor prognosis.  Patient Active Problem List   Diagnosis Date Noted  . Anemia 07/23/2013  . Acute systolic CHF (congestive heart failure) 07/23/2013  . Chronic systolic CHF (congestive heart failure) 04/06/2013  . Cardiomyopathy 02/27/2013  . Weakness 02/27/2013  . MDS (myelodysplastic syndrome) with 5q- syndrome 03/19/2012  . GERD (gastroesophageal reflux disease) 11/24/2011  . Insomnia 11/15/2011  . Thrombocytopenia, acquired   . Hyperlipidemia   . Hypertension   . Orthostatic hypotension   . Anemia, macrocytic   . Arteriosclerotic cardiovascular disease (ASCVD)-ischemic cardiomyopathy 11/21/2009     PLAN:  1. I personally reviewed and went over laboratory results with the patient. 2. Recommended Hospice, but patient and daughter decline. 3. Labs every week: CBC 4. Recommend ProAir inhaler at time of discharge 5. Will follow O2 sats as an outpatient and prescribe O2 as deemed necessary 6. Will continue with symptom management 7. Daughter understands prognosis, but "I am not ready to let Daddy go." 8. Follow-up appointment in 3 weeks at the [facility].  All questions were answered. The patient knows to call the clinic with any problems, questions or concerns. We can certainly see the patient much sooner if necessary.  Patient and plan discussed with Dr. Alla German  and he is in agreement with the aforementioned.  KEFALAS,THOMAS

## 2013-07-24 NOTE — Evaluation (Signed)
Physical Therapy Evaluation Patient Details Name: Lee Ramirez MRN: [id number] DOB: [birth year] Today's Date: 07/24/2013 Time: 0900-0930 PT Time Calculation (min): 30 min  PT Assessment / Plan / Recommendation History of Present Illness  Pt is admitted again with severe anemia.  He was seen by this therapist on 04-07-13 for evaluation.  He has a hx of myelodysplastic syndrome and came is with severe dyspnea.  He lives with his wife and daughter.  They have CGs during the day who assist pt with bathing and dressing.  He is very sedentary at baseline, using a walker to get from chair to bathroom with a walker.  Clinical Impression   Pt was seen for evaluation.  He was found to be significantly deconditioned, however he is very close to his functional baseline.  He needs mod assist to transfer OOB and was only able to ambulate 20' with a walker.  His O2 sats were checked.  Pt was found to be on 3 L O2 when I first arrived.  O2 sat=99%.  He was taken off of supplemental PT and throughout the 30 minutes I spent with him, O2 sat did not dip below 91% (with gait).  Resting O2 sat on RA =99%.      PT Assessment  Patient needs continued PT services    Follow Up Recommendations  Home health PT    Does the patient have the potential to tolerate intense rehabilitation      Barriers to Discharge        Equipment Recommendations       Recommendations for Other Services     Frequency Min 3X/week    Precautions / Restrictions Precautions Precautions: Fall Restrictions Weight Bearing Restrictions: No   Pertinent Vitals/Pain       Mobility  Bed Mobility Bed Mobility: Supine to Sit Supine to Sit: 3: Mod assist Details for Bed Mobility Assistance: pt states that he usually needs assist to exit the bed Transfers Transfers: Sit to Stand;Stand to Sit Sit to Stand: 4: Min assist;From bed;With upper extremity assist Stand to Sit: 5: Supervision;To chair/3-in-1;With upper extremity  assist Ambulation/Gait Ambulation/Gait Assistance: 5: Supervision Ambulation Distance (Feet): 20 Feet Assistive device: Rolling walker Gait Pattern: Trunk flexed;Right flexed knee in stance;Left flexed knee in stance Gait velocity: slow and labored Stairs: No Wheelchair Mobility Wheelchair Mobility: No    Exercises     PT Diagnosis: Difficulty walking;Generalized weakness  PT Problem List: Decreased strength;Decreased activity tolerance PT Treatment Interventions: Gait training;Functional mobility training;Therapeutic exercise;Patient/family education     PT Goals(Current goals can be found in the care plan section) Acute Rehab PT Goals Patient Stated Goal: none stated PT Goal Formulation: With patient Time For Goal Achievement: 08/07/13 Potential to Achieve Goals: Good  Visit Information  Last PT Received On: 07/24/13 History of Present Illness: Pt is admitted again with severe anemia.  He was seen by this therapist on 04-07-13 for evaluation.  He has a hx of myelodysplastic syndrome and came is with severe dyspnea.  He lives with his wife and daughter.  They have CGs during the day who assist pt with bathing and dressing.  He is very sedentary at baseline, using a walker to get from chair to bathroom with a walker.       Prior Functioning  Home Living Family/patient expects to be discharged to:: Private residence    Cognition  Cognition Arousal/Alertness: Awake/alert Behavior During Therapy: [facility] for tasks assessed/performed Overall Cognitive Status: Within Functional Limits for tasks assessed  Extremity/Trunk Assessment Lower Extremity Assessment Lower Extremity Assessment: Generalized weakness   Balance Balance Balance Assessed: No  End of Session PT - End of Session Equipment Utilized During Treatment: Gait belt Activity Tolerance: Patient tolerated treatment well Patient left: in chair;with call bell/phone within reach;with chair alarm set  GP Functional  Assessment Tool Used: clinical judgement Functional Limitation: Mobility: Walking and moving around Mobility: Walking and Moving Around Current Status (N8295): At least 20 percent but less than 40 percent impaired, limited or restricted Mobility: Walking and Moving Around Goal Status [id number]): At least 1 percent but less than 20 percent impaired, limited or restricted   Konrad Penta 07/24/2013, 9:48 AM

## 2013-07-25 LAB — TYPE AND SCREEN
ABO/RH(D): O POS
Antibody Screen: NEGATIVE
Unit division: 0

## 2013-07-30 ENCOUNTER — Other Ambulatory Visit (HOSPITAL_COMMUNITY): Payer: Medicare Other

## 2013-07-30 ENCOUNTER — Other Ambulatory Visit (HOSPITAL_COMMUNITY): Payer: Self-pay | Admitting: Oncology

## 2013-07-30 ENCOUNTER — Encounter (HOSPITAL_COMMUNITY): Payer: Medicare Other | Attending: Internal Medicine

## 2013-07-30 DIAGNOSIS — D469 Myelodysplastic syndrome, unspecified: Secondary | ICD-10-CM

## 2013-07-30 LAB — CBC
HCT: 22 % — ABNORMAL LOW (ref 39.0–52.0)
MCHC: 32.7 g/dL (ref 30.0–36.0)
MCV: 97.3 fL (ref 78.0–100.0)
Platelets: 52 10*3/uL — ABNORMAL LOW (ref 150–400)
RBC: 2.26 MIL/uL — ABNORMAL LOW (ref 4.22–5.81)
RDW: 21.3 % — ABNORMAL HIGH (ref 11.5–15.5)

## 2013-07-30 IMAGING — US US ABDOMEN LIMITED
1 series · 14 of 25 positions shown · non-contrast
Comparison: None.

CLINICAL DATA: Elevated alkaline phosphatase.

LIMITED ABDOMINAL ULTRASOUND - RIGHT UPPER QUADRANT

[Series 1: us abdomen limited · 0.23mm/px · 14 of 74 slices shown]
[im 1/74]
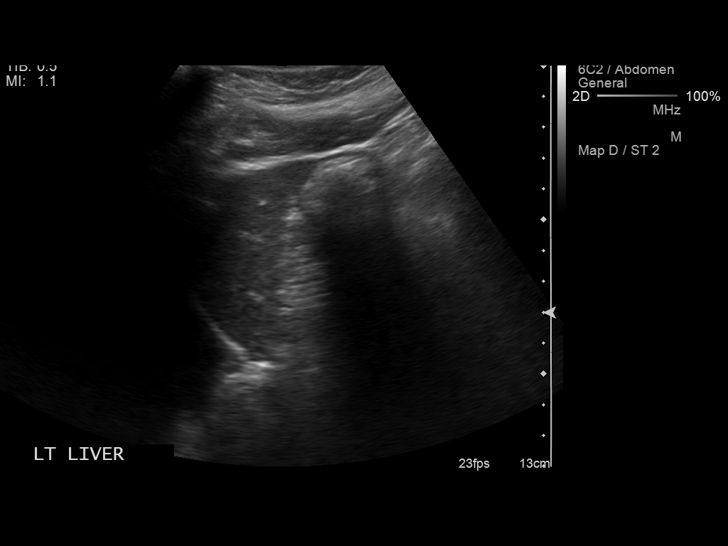
[im 7/74]
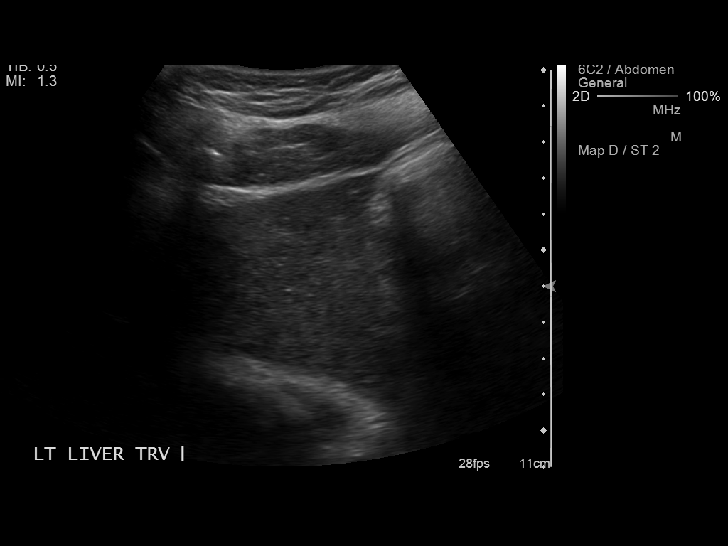
[im 13/74]
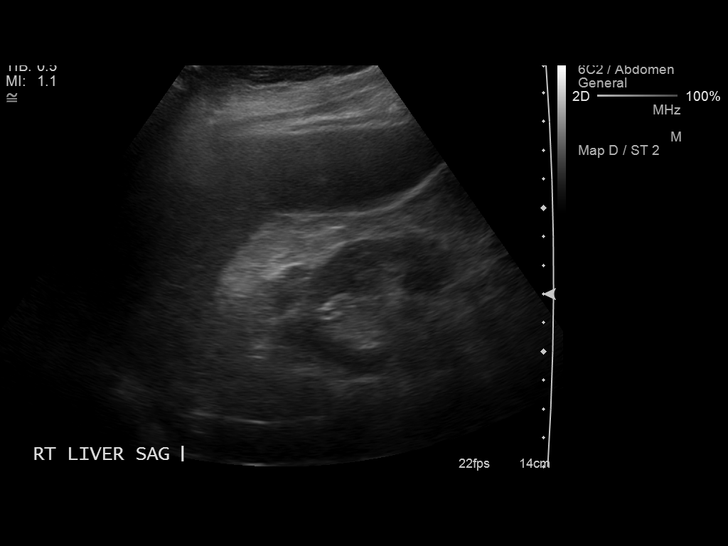
[im 19/74]
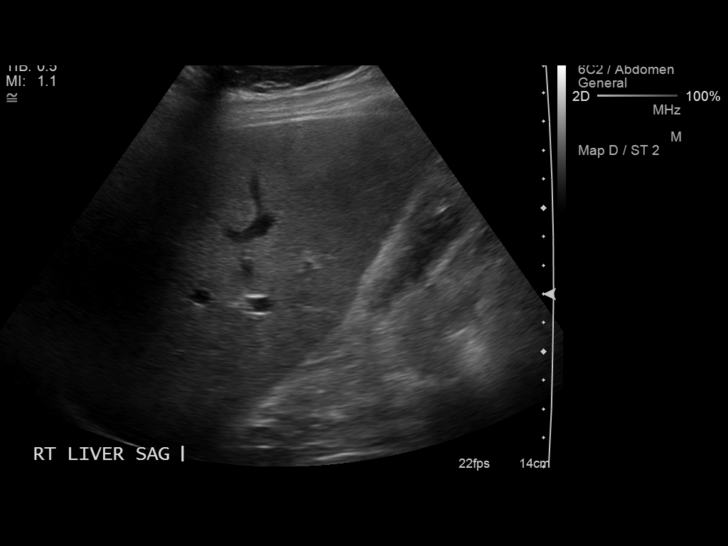
[im 25/74]
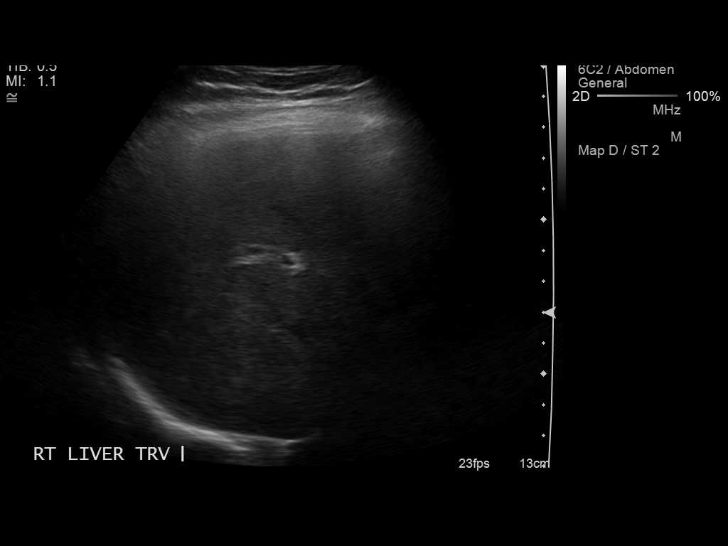
[im 28/74]
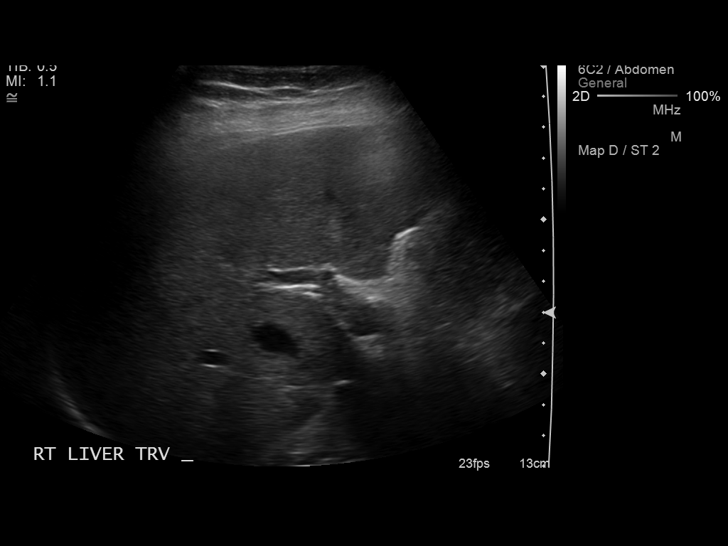
[im 34/74]
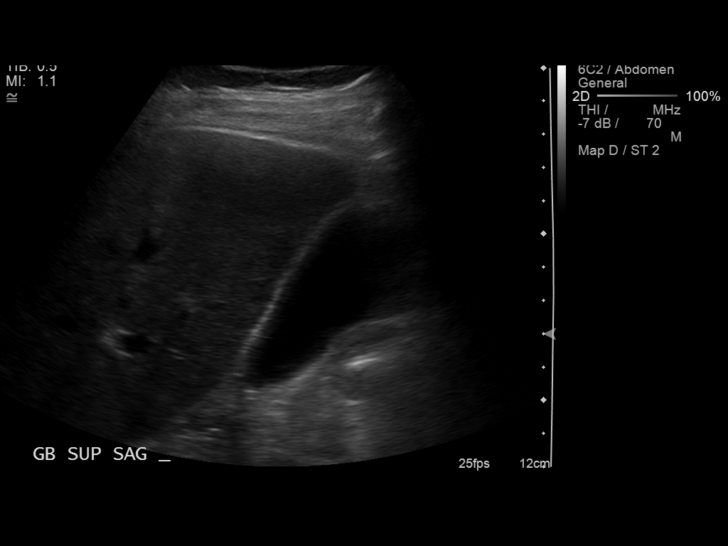
[im 40/74]
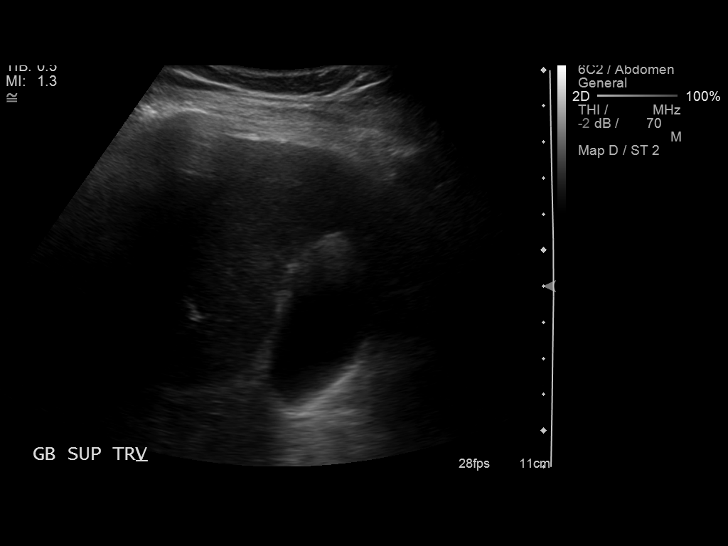
[im 46/74]
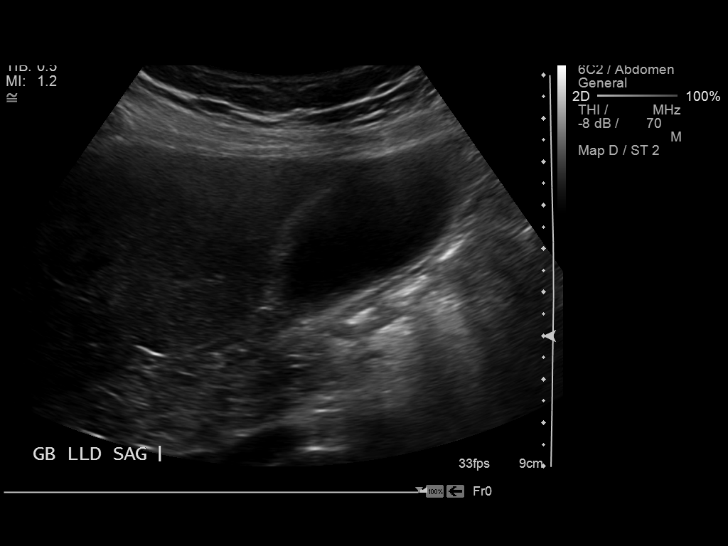
[im 49/74]
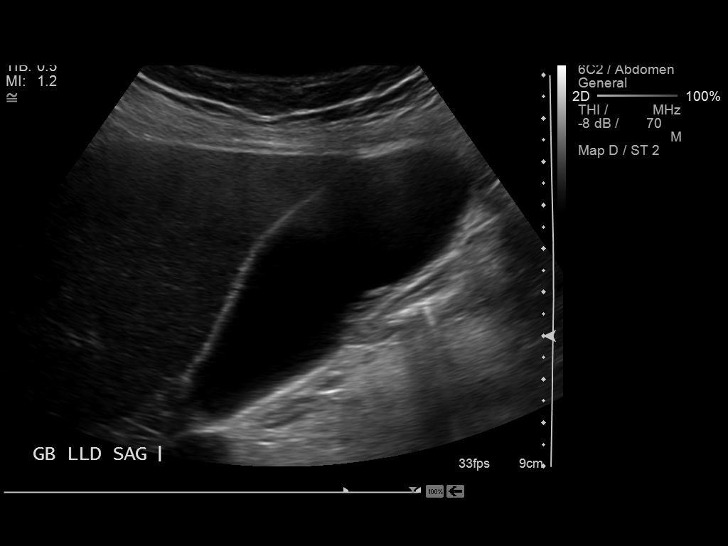
[im 55/74]
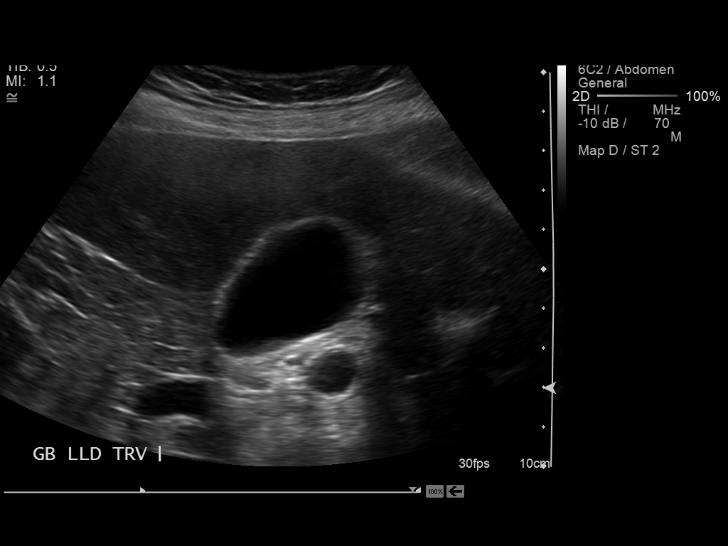
[im 61/74]
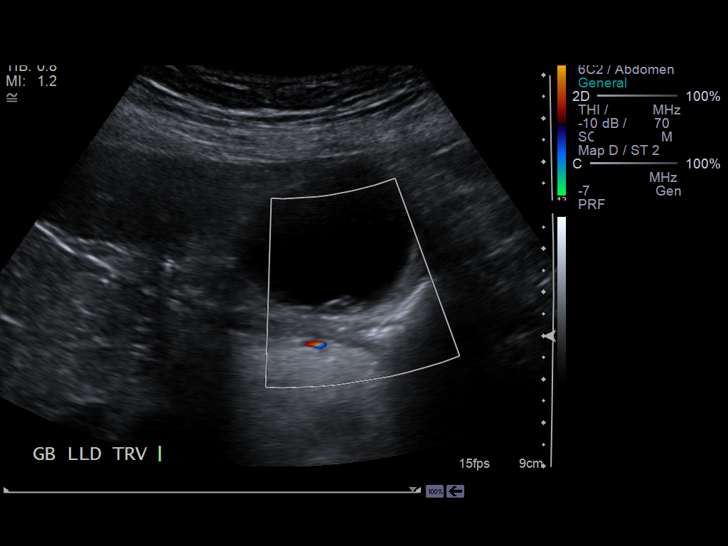
[im 67/74]
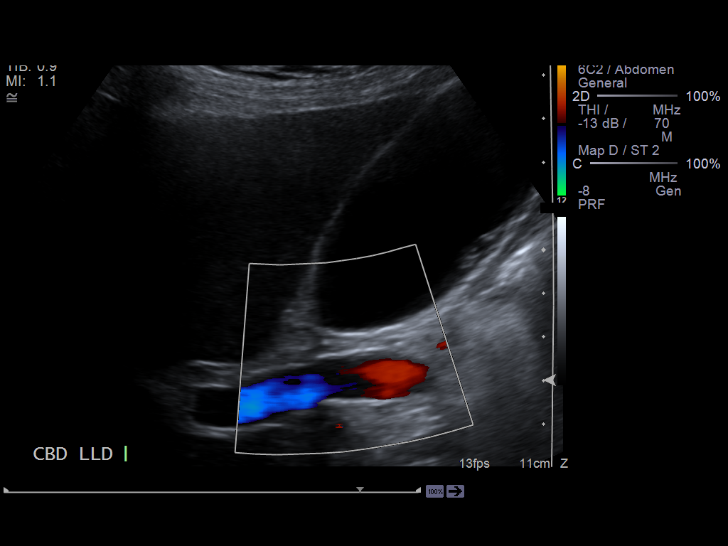
[im 74/74]
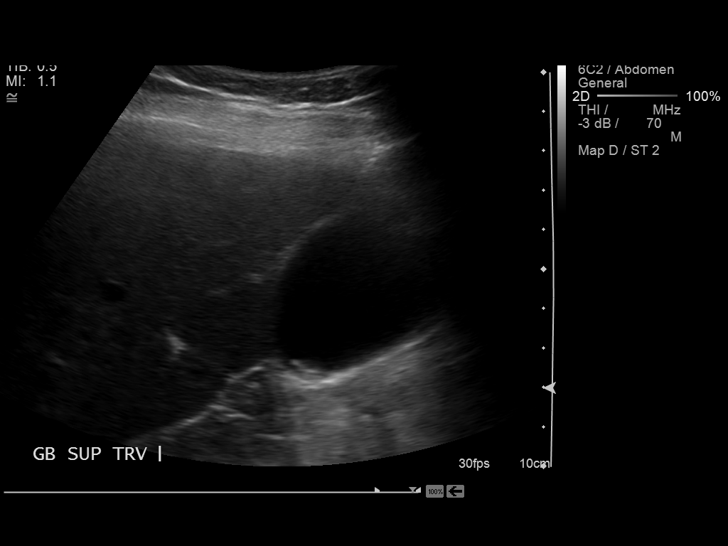

[14 of 25 positions shown; findings below may reference images not displayed]

FINDINGS: Gallbladder:  Small amount of sludge.  No visible shadowing stones.
No wall thickening or negative sonographic Giorgi.

Common bile duct:  Normal caliber, 4 mm.

Liver:  Normal size and echotexture.  No focal abnormality.  No
biliary ductal dilatation.
IMPRESSION: Small amount of sludge in the gallbladder.  No acute findings.

## 2013-07-30 NOTE — Addendum Note (Signed)
Addended byLeida Lauth on: 07/30/2013 11:26 AM   Modules accepted: Orders

## 2013-07-30 NOTE — Progress Notes (Signed)
Labs drawn today for cbc 

## 2013-07-30 NOTE — Addendum Note (Signed)
Addended by: Edythe Lynn A on: 07/30/2013 11:39 AM   Modules accepted: Orders

## 2013-07-31 ENCOUNTER — Encounter (HOSPITAL_BASED_OUTPATIENT_CLINIC_OR_DEPARTMENT_OTHER): Payer: Medicare Other

## 2013-07-31 ENCOUNTER — Encounter: Payer: Medicare Other | Admitting: Adult Health

## 2013-07-31 VITALS — BP 96/52 | HR 72 | Temp 97.6°F | Resp 18

## 2013-07-31 DIAGNOSIS — D469 Myelodysplastic syndrome, unspecified: Secondary | ICD-10-CM

## 2013-07-31 MED ORDER — HEPARIN SOD (PORK) LOCK FLUSH 100 UNIT/ML IV SOLN: 500.0000 [IU] | Freq: Every day | INTRAVENOUS | Status: DC | PRN

## 2013-07-31 MED ORDER — DIPHENHYDRAMINE HCL 25 MG PO CAPS
25.0000 mg | ORAL_CAPSULE | Freq: Once | ORAL | Status: AC
Start: 2013-07-31 — End: 2013-07-31
  Administered 2013-07-31: 25 mg via ORAL
  Filled 2013-07-31: qty 1

## 2013-07-31 MED ORDER — SODIUM CHLORIDE 0.9 % IJ SOLN
10.0000 mL | INTRAMUSCULAR | Status: AC | PRN
  Administered 2013-07-31: 10 mL

## 2013-07-31 MED ORDER — ACETAMINOPHEN 325 MG PO TABS
650.0000 mg | ORAL_TABLET | Freq: Once | ORAL | Status: AC
  Administered 2013-07-31: 650 mg via ORAL
  Filled 2013-07-31: qty 2

## 2013-07-31 MED ORDER — SODIUM CHLORIDE 0.9 % IV SOLN
250.0000 mL | Freq: Once | INTRAVENOUS | Status: AC
  Administered 2013-07-31: 250 mL via INTRAVENOUS

## 2013-07-31 NOTE — Progress Notes (Signed)
Tolerated prbc transfusion well.  Home accompanied by daughter.

## 2013-08-01 LAB — TYPE AND SCREEN
ABO/RH(D): O POS
Antibody Screen: NEGATIVE
Unit division: 0
Unit division: 0

## 2013-08-03 ENCOUNTER — Encounter: Payer: Medicare Other | Admitting: Adult Health

## 2013-08-03 NOTE — Progress Notes (Signed)
HPI: Lee Ramirez is an 79 year old former patient of Dr. Dietrich Pates we are following for ongoing assessment and management of chronic systolic heart failure, with history of transfusion dependent myelodysplastic syndrome, thrombocytopenia, and hyperlipidemia. The patient was recently admitted to the hospital and on 07/23/2013 in the setting of anemia with a hemoglobin of 5.7, and significant shortness of breath. The patient is diuresis to a weight of 77.1 kg. Discharged on Lasix 40 mg daily and continued on carvedilol 6.2 mg twice a day.  Allergies  Allergen Reactions  . Aspirin Anaphylaxis  . Revlimid [Lenalidomide] Rash  . Carafate [Sucralfate]     Current Outpatient Prescriptions  Medication Sig Dispense Refill  . acetaminophen (TYLENOL) 500 MG tablet Take 1,000 mg by mouth every 6 (six) hours as needed. Pain      . albuterol (PROAIR HFA) 108 (90 BASE) MCG/ACT inhaler Inhale 2 puffs into the lungs every 6 (six) hours as needed. For shortness of breath      . albuterol (PROVENTIL HFA;VENTOLIN HFA) 108 (90 BASE) MCG/ACT inhaler Inhale 2 puffs into the lungs every 4 (four) hours as needed for wheezing or shortness of breath.  1 Inhaler  0  . allopurinol (ZYLOPRIM) 300 MG tablet Take 300 mg by mouth every evening.       . carvedilol (COREG) 6.25 MG tablet Take 1 tablet (6.25 mg total) by mouth 2 (two) times daily. Resume on 07/28/13.      Marland Kitchen CRANBERRY PO Take 1 capsule by mouth daily.      . cyanocobalamin (,VITAMIN B-12,) 1000 MCG/ML injection Inject 1,000 mcg into the muscle every 30 (thirty) days.      . furosemide (LASIX) 40 MG tablet Take 1 tablet (40 mg total) by mouth daily.  3 tablet  0  . meclizine (ANTIVERT) 12.5 MG tablet Take 12.5 mg by mouth 3 (three) times daily as needed. Dizziness      . Multiple Vitamins-Iron (DAILY-VITAMIN/IRON PO) Take 1 tablet by mouth every morning.       . nitroGLYCERIN (NITROSTAT) 0.4 MG SL tablet Place 1 tablet (0.4 mg total) under the tongue every 5  (five) minutes as needed.  25 tablet  3  . ondansetron (ZOFRAN) 4 MG tablet Take 4 mg by mouth every 8 (eight) hours as needed for nausea or vomiting.      . potassium chloride (K-DUR) 10 MEQ tablet Take 2 tablets (20 mEq total) by mouth daily.  3 tablet  0  . pravastatin (PRAVACHOL) 40 MG tablet Take 1 tablet (40 mg total) by mouth daily.  30 tablet  3  . Travoprost, BAK Free, (TRAVATAN) 0.004 % SOLN ophthalmic solution Place 1 drop into both eyes at bedtime.       Marland Kitchen zolpidem (AMBIEN) 10 MG tablet Take 5 mg by mouth at bedtime as needed. For sleep       No current facility-administered medications for this visit.    Past Medical History  Diagnosis Date  . ASCVD (arteriosclerotic cardiovascular disease)     Sizable non-Q. myocardial infarction in 09/2009; severe three-vessel disease with an ejection fraction of 35%; three-vessel DES complicated by thrombocytopenia and mild renal insufficiency  . Hyperlipidemia     09/2010: TC-151, TG-411, H.-32, L.-not calculated   . Anemia     H/H -12.3/37.2; MCV-103; platelets-114 (08/2010)   . 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 Hypertension   . Orthostatic hypotension     Lightheaded; no  syncope  . Thrombocytopenia, acquired     09/2010-114,000  . Hepatic steatosis 2013  . Thrombocytopenia     referred to hem-onc 5/13  . Squamous cell skin cancer 10/2012    of nose  . Myelodysplastic syndrome   . Myelodysplastic syndrome     Past Surgical History  Procedure Laterality Date  . Inguinal hernia repair      Right  . Appendectomy    . Coronary angioplasty with stent placement    . Bone marrow biopsy  02/19/12  . Bone marrow aspiration  02/19/12  . Skin cancer excision  10/2012  . Cardiac surgery      ROS: PHYSICAL EXAM There were no vitals taken for this visit.  EKG:  ASSESSMENT AND PLAN

## 2013-08-06 ENCOUNTER — Other Ambulatory Visit (HOSPITAL_COMMUNITY): Payer: Medicare Other

## 2013-08-10 ENCOUNTER — Other Ambulatory Visit (HOSPITAL_COMMUNITY): Payer: Medicare Other

## 2013-08-10 ENCOUNTER — Telehealth (HOSPITAL_COMMUNITY): Payer: Self-pay | Admitting: Oncology

## 2013-08-10 NOTE — Telephone Encounter (Signed)
Davide Risdon Herzig's daughter, [name], called and advised that home health is unable to draw labs and get results today - advised that patient needs to come to [facility] in the morning to get labs and potentially a blood transfusion.  Also, MD appt rescheduled for January and daughter was advised of same.  Verbalization of understanding for follow-up confirmed.

## 2013-08-11 ENCOUNTER — Encounter (HOSPITAL_BASED_OUTPATIENT_CLINIC_OR_DEPARTMENT_OTHER): Payer: Medicare Other

## 2013-08-11 ENCOUNTER — Telehealth (HOSPITAL_COMMUNITY): Payer: Self-pay | Admitting: *Deleted

## 2013-08-11 ENCOUNTER — Other Ambulatory Visit (HOSPITAL_COMMUNITY): Payer: Self-pay | Admitting: Hematology and Oncology

## 2013-08-11 VITALS — BP 119/71 | HR 97 | Temp 97.4°F | Resp 20

## 2013-08-11 DIAGNOSIS — D469 Myelodysplastic syndrome, unspecified: Secondary | ICD-10-CM

## 2013-08-11 LAB — CBC
HCT: 24.5 % — ABNORMAL LOW (ref 39.0–52.0)
Hemoglobin: 7.9 g/dL — ABNORMAL LOW (ref 13.0–17.0)
MCV: 95.7 fL (ref 78.0–100.0)
RBC: 2.56 MIL/uL — ABNORMAL LOW (ref 4.22–5.81)
WBC: 7.8 10*3/uL (ref 4.0–10.5)

## 2013-08-11 MED ORDER — SODIUM CHLORIDE 0.9 % IJ SOLN
10.0000 mL | INTRAMUSCULAR | Status: AC | PRN
  Administered 2013-08-11: 10 mL

## 2013-08-11 MED ORDER — ACETAMINOPHEN 325 MG PO TABS
ORAL_TABLET | ORAL | Status: AC
  Filled 2013-08-11: qty 2

## 2013-08-11 MED ORDER — ACETAMINOPHEN 325 MG PO TABS
650.0000 mg | ORAL_TABLET | Freq: Once | ORAL | Status: AC
  Administered 2013-08-11: 650 mg via ORAL

## 2013-08-11 MED ORDER — SODIUM CHLORIDE 0.9 % IV SOLN
250.0000 mL | Freq: Once | INTRAVENOUS | Status: AC
  Administered 2013-08-11: 250 mL via INTRAVENOUS

## 2013-08-11 MED ORDER — FUROSEMIDE 10 MG/ML IJ SOLN
20.0000 mg | Freq: Once | INTRAMUSCULAR | Status: AC
  Administered 2013-08-11: 20 mg via INTRAVENOUS
  Filled 2013-08-11: qty 2

## 2013-08-11 NOTE — Progress Notes (Signed)
1415 tylenol 650 mg given po for c/o back pain.   At 1445 was asleep.  No further c/o pain rest of visit Home at 1600 accompanied by family friend. States he feels a little stronger after transfusion.

## 2013-08-11 NOTE — Telephone Encounter (Signed)
Per lab, hemoglobin is 7.9. Patient is here waiting on results to see if transfusion is needed.

## 2013-08-11 NOTE — Progress Notes (Signed)
Labs drawn today for cbc 

## 2013-08-12 ENCOUNTER — Other Ambulatory Visit (HOSPITAL_COMMUNITY): Payer: Medicare Other

## 2013-08-12 ENCOUNTER — Ambulatory Visit (HOSPITAL_COMMUNITY): Payer: Medicare Other

## 2013-08-12 LAB — TYPE AND SCREEN
Antibody Screen: NEGATIVE
Unit division: 0

## 2013-08-25 IMAGING — CR DG CHEST 1V PORT
1 series · 1 of 1 positions shown · non-contrast
Comparison: 03/02/2013

CLINICAL DATA: Generalized weakness

PORTABLE CHEST - 1 VIEW

[portable]
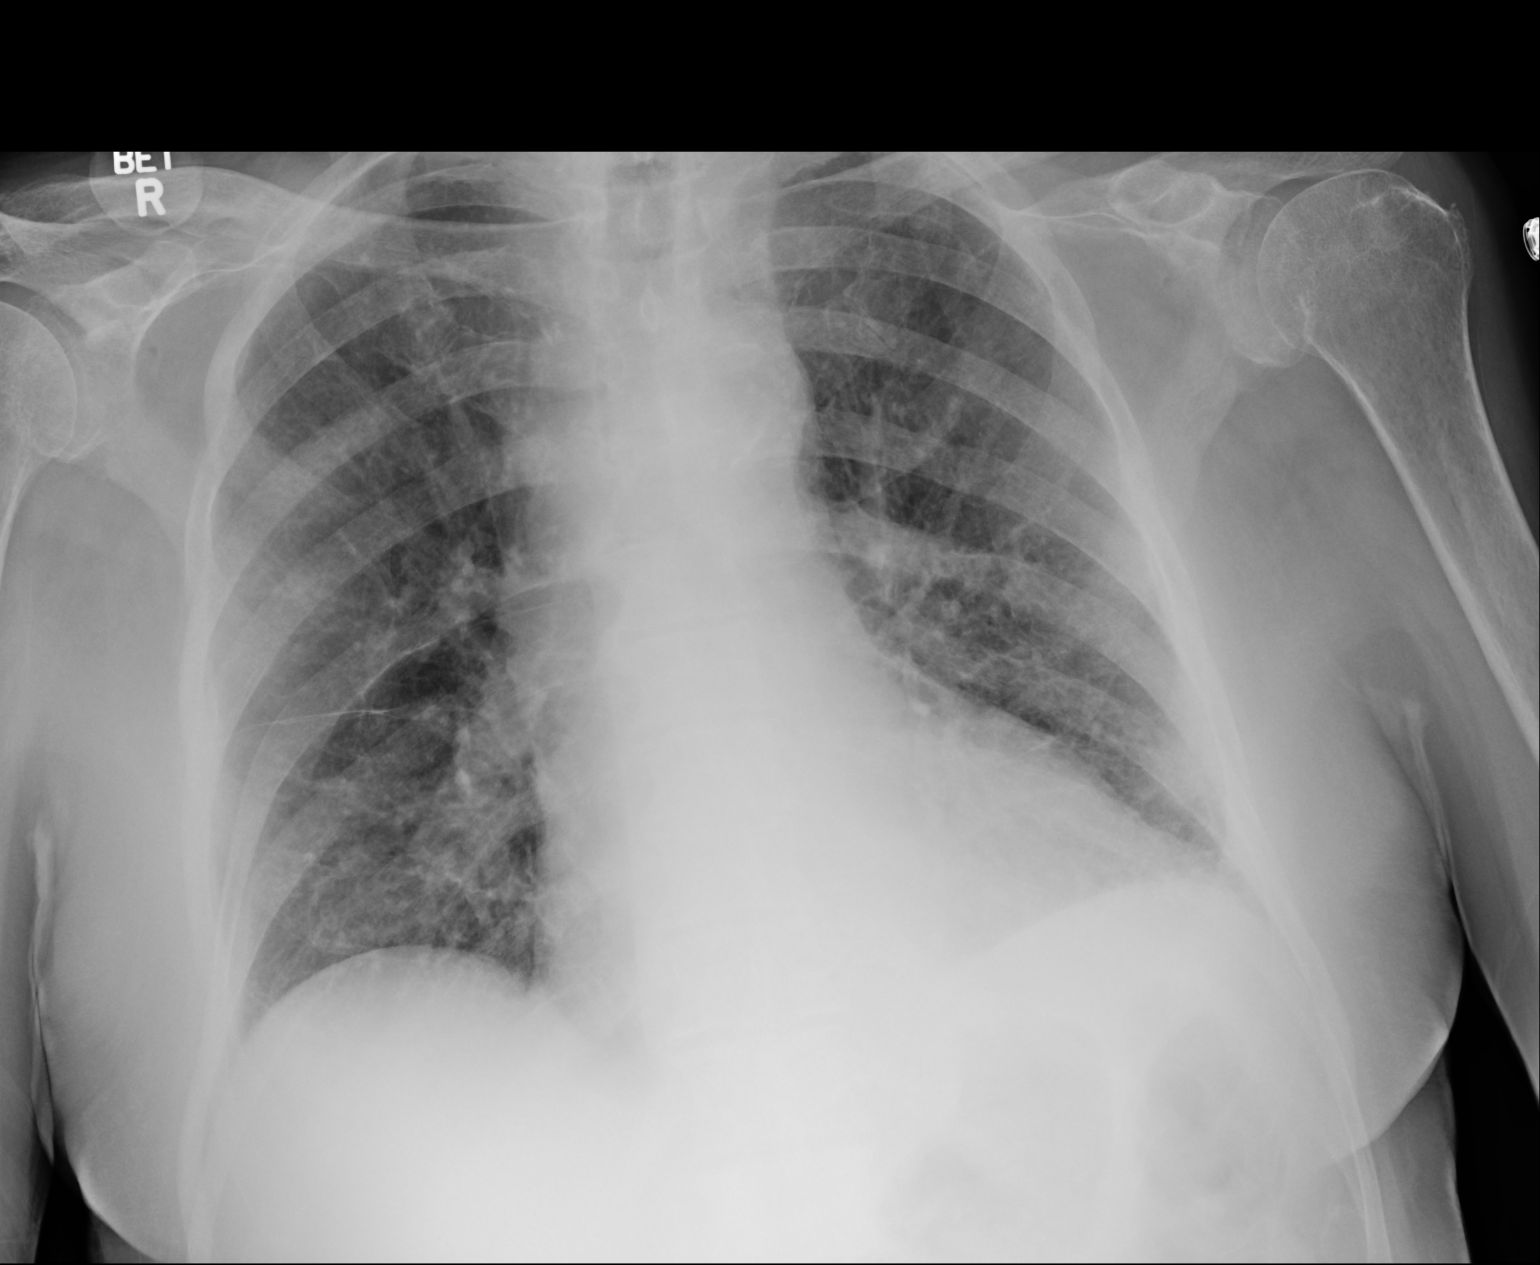

[1 of 1 positions shown; findings below may reference images not displayed]

FINDINGS: Normal cardiac silhouette.  Lungs are mildly
hyperinflated.  No effusion, infiltrate, or pneumothorax.  Chronic
bronchitic change centrally.
IMPRESSION: Chronic bronchitic markings.  No acute findings.

## 2013-08-27 ENCOUNTER — Ambulatory Visit (HOSPITAL_COMMUNITY): Payer: Medicare Other

## 2013-08-28 ENCOUNTER — Encounter (HOSPITAL_COMMUNITY): Payer: Medicare Other | Attending: Internal Medicine

## 2013-08-28 ENCOUNTER — Encounter (HOSPITAL_BASED_OUTPATIENT_CLINIC_OR_DEPARTMENT_OTHER): Payer: Medicare Other

## 2013-08-28 VITALS — BP 112/55 | HR 62 | Temp 97.4°F | Resp 20

## 2013-08-28 DIAGNOSIS — D469 Myelodysplastic syndrome, unspecified: Secondary | ICD-10-CM | POA: Insufficient documentation

## 2013-08-28 DIAGNOSIS — R0602 Shortness of breath: Secondary | ICD-10-CM

## 2013-08-28 LAB — HEMOGLOBIN AND HEMATOCRIT, BLOOD
HCT: 22.2 % — ABNORMAL LOW (ref 39.0–52.0)
HEMOGLOBIN: 7.3 g/dL — AB (ref 13.0–17.0)

## 2013-08-28 LAB — PREPARE RBC (CROSSMATCH)

## 2013-08-28 MED ORDER — DIPHENHYDRAMINE HCL 25 MG PO CAPS
25.0000 mg | ORAL_CAPSULE | Freq: Once | ORAL | Status: AC
  Administered 2013-08-28: 25 mg via ORAL
  Filled 2013-08-28: qty 1

## 2013-08-28 MED ORDER — SODIUM CHLORIDE 0.9 % IV SOLN: 250.0000 mL | Freq: Once | INTRAVENOUS | Status: DC

## 2013-08-28 MED ORDER — SODIUM CHLORIDE 0.9 % IV SOLN
250.0000 mL | Freq: Once | INTRAVENOUS | Status: AC
  Administered 2013-08-28: 250 mL via INTRAVENOUS

## 2013-08-28 MED ORDER — FUROSEMIDE 10 MG/ML IJ SOLN
INTRAMUSCULAR | Status: AC
  Filled 2013-08-28: qty 2

## 2013-08-28 MED ORDER — FUROSEMIDE 10 MG/ML IJ SOLN
20.0000 mg | Freq: Once | INTRAMUSCULAR | Status: AC
  Administered 2013-08-28: 20 mg via INTRAVENOUS

## 2013-08-28 MED ORDER — ACETAMINOPHEN 325 MG PO TABS
650.0000 mg | ORAL_TABLET | Freq: Once | ORAL | Status: AC
  Administered 2013-08-28: 650 mg via ORAL
  Filled 2013-08-28: qty 2

## 2013-08-28 MED ORDER — ALBUTEROL SULFATE HFA 108 (90 BASE) MCG/ACT IN AERS: 2.0000 | INHALATION_SPRAY | Freq: Four times a day (QID) | RESPIRATORY_TRACT | Status: AC | PRN

## 2013-08-28 NOTE — Patient Instructions (Signed)
.  [facility] Discharge Instructions  RECOMMENDATIONS MADE BY THE CONSULTANT AND ANY TEST RESULTS WILL BE SENT TO YOUR REFERRING PHYSICIAN.  EXAM FINDINGS BY THE PHYSICIAN TODAY AND SIGNS OR SYMPTOMS TO REPORT TO CLINIC OR PRIMARY PHYSICIAN: Exam and findings as discussed by Dr. Barnet Glasgow. Weekly blood work and transfusions if needed. Your 0xygen saturation was normal at  95 %. Insurance will not pay for oxygen unless you are below 90%.  INSTRUCTIONS/FOLLOW-UP: 1 month   Thank you for choosing [facility] to provide your oncology and hematology care.  To afford each patient quality time with our providers, please arrive at least 15 minutes before your scheduled appointment time.  With your help, our goal is to use those 15 minutes to complete the necessary work-up to ensure our physicians have the information they need to help with your evaluation and healthcare recommendations.    Effective January 1st, 2014, we ask that you re-schedule your appointment with our physicians should you arrive 10 or more minutes late for your appointment.  We strive to give you quality time with our providers, and arriving late affects you and other patients whose appointments are after yours.    Again, thank you for choosing [facility].  Our hope is that these requests will decrease the amount of time that you wait before being seen by our physicians.       _____________________________________________________________  Should you have questions after your visit to [facility], please contact our office at [phone] between the hours of 8:30 a.m. and 5:00 p.m.  Voicemails left after 4:30 p.m. will not be returned until the following business day.  For prescription refill requests, have your pharmacy contact our office with your prescription refill request.

## 2013-08-28 NOTE — Progress Notes (Signed)
Tolerated transfusion 2 units prbc well.

## 2013-08-28 NOTE — Progress Notes (Signed)
Labs drawn today for hh and type and cross

## 2013-08-28 NOTE — Progress Notes (Signed)
[facility]  OFFICE PROGRESS NOTE  Leonides Grills, MD [address] [place] [address]  DIAGNOSIS: Myelodysplastic syndrome - Plan: 0.9 %  sodium chloride infusion, sodium chloride 0.9 % injection 10 mL, heparin lock flush 100 unit/mL, heparin lock flush 100 unit/mL, sodium chloride 0.9 % injection 3 mL, Type and screen, Prepare RBC, Hemoglobin and hematocrit, blood, Type and screen, Hemoglobin and hematocrit, blood, Prepare RBC, PR NONINVASV OXYGEN SATUR,MULTIPLE, PR OXYGEN CONCENTRATOR, DISCONTINUED: acetaminophen (TYLENOL) tablet 650 mg, DISCONTINUED: diphenhydrAMINE (BENADRYL) capsule 25 mg  Chief Complaint  Patient presents with  . Anemia    Myelodysplastic syndrome nonresponsive erythrocyte stimulating agents. Intolerant of Revlimid.    CURRENT THERAPY: Periodic transfusions for anemia secondary to myelodysplastic syndrome, not responsive to erythrocyte stimulating agents.  INTERVAL HISTORY: Lee Ramirez 80 y.o. male returns for continuation of transfusion therapy for myelodysplastic syndrome, 5q-, intolerant of Revlimid and nonresponsive to erythrocyte stimulating agents. He is taking Ensure by mouth and denies any fever or chills. He does have diarrhea on occasion. He has had reddening of his left ear but he lays on his left side most of the time period his wife is also gravely ill and is cared for by home health aide during the day in shifts. He again was questioned in the presence of his son, daughter-in-law, and nephew as to whether or not he wants to continue transfusions. He stated that he wishes to do so. Outpatient hemoglobin determine him 08/28/2011 was 6.9 and for that reason he was brought in early today in hopes of getting 2 units of packed red blood cells. His O2 saturation at the bedside was 95% but his hands were both cyanotic and cold. He complains of shortness of breath even at rest. He denies any  palpitations or episodes of PND.  MEDICAL HISTORY: Past Medical History  Diagnosis Date  . ASCVD (arteriosclerotic cardiovascular disease)     Sizable non-Q. myocardial infarction in 09/2009; severe three-vessel disease with an ejection fraction of 35%; three-vessel DES complicated by thrombocytopenia and mild renal insufficiency  . Hyperlipidemia     09/2010: TC-151, TG-411, H.-32, L.-not calculated   . Anemia     H/H -12.3/37.2; MCV-103; platelets-114 (08/2010)   . 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 Hypertension   . Orthostatic hypotension     Lightheaded; no syncope  . Thrombocytopenia, acquired     09/2010-114,000  . Hepatic steatosis 2013  . Thrombocytopenia     referred to hem-onc 5/13  . Squamous cell skin cancer 10/2012    of nose  . Myelodysplastic syndrome   . Myelodysplastic syndrome     INTERIM HISTORY: has Arteriosclerotic cardiovascular disease (ASCVD)-ischemic cardiomyopathy; Thrombocytopenia, acquired; Hyperlipidemia; Hypertension; Orthostatic hypotension; Anemia, macrocytic; Insomnia; GERD (gastroesophageal reflux disease); MDS (myelodysplastic syndrome) with 5q- syndrome; Cardiomyopathy; Weakness; Chronic systolic CHF (congestive heart failure); Anemia; and Acute systolic CHF (congestive heart failure) on his problem list.    ALLERGIES:  is allergic to aspirin; revlimid; and carafate.  MEDICATIONS: has a current medication list which includes the following prescription(s): acetaminophen, albuterol, albuterol, allopurinol, carvedilol, cranberry, cyanocobalamin, furosemide, meclizine, multiple vitamins-iron, nitroglycerin, ondansetron, potassium chloride, pravastatin, travoprost (bak free), and zolpidem, and the following Facility-Administered Medications: sodium chloride, acetaminophen, diphenhydramine, and furosemide.  SURGICAL HISTORY:  Past Surgical History  Procedure Laterality Date  . Inguinal hernia repair       Right  .  Appendectomy    . Coronary angioplasty with stent placement    . Bone marrow biopsy  02/19/12  . Bone marrow aspiration  02/19/12  . Skin cancer excision  10/2012  . Cardiac surgery      FAMILY HISTORY: family history is not on file.  SOCIAL HISTORY:  reports that he has never smoked. He has never used smokeless tobacco. He reports that he does not drink alcohol or use illicit drugs.  REVIEW OF SYSTEMS:  Other than that discussed above is noncontributory.  PHYSICAL EXAMINATION: ECOG PERFORMANCE STATUS: 4 - Bedbound  There were no vitals taken for this visit.  GENERAL:alert, cachectic. Markedly pale. SKIN: skin color, texture, turgor are normal, no rashes or significant lesions EYES: PERLA; Conjunctiva are pink and non-injected, sclera clear EARS: Left ear erythematous with evidence of previous resection for basal cell carcinoma. OROPHARYNX oral encrustations. NECK: supple, thyroid normal size, non-tender, without nodularity. No masses CHEST: Increased AP diameter with no gynecomastia. LYMPH:  no palpable lymphadenopathy in the cervical, axillary or inguinal LUNGS: clear to auscultation and percussion with normal breathing effort HEART: regular rate & rhythm and no murmurs. ABDOMEN:abdomen soft, non-tender and normal bowel sounds MUSCULOSKELETAL:no cyanosis of digits and no clubbing. Range of motion normal.  NEURO: alert & oriented x 3 with fluent speech, no focal motor/sensory deficits   LABORATORY DATA:  08/27/2013: WBC 8.4, hemoglobin 6.9, platelets 69,000.   Office Visit on 08/28/2013  Component Date Value Range Status  . ABO/RH(D) 08/28/2013 O POS   Final  . Antibody Screen 08/28/2013 NEG   Final  . Sample Expiration 08/28/2013 08/31/2013   Final  . Unit Number 08/28/2013 [id number]   Final  . Blood Component Type 08/28/2013 RBC, LR IRR   Final  . Unit division 08/28/2013 00   Final  . Status of Unit 08/28/2013 ALLOCATED   Final  . Transfusion Status  08/28/2013 OK TO TRANSFUSE   Final  . Crossmatch Result 08/28/2013 Compatible   Final  . Hemoglobin 08/28/2013 7.3* 13.0 - 17.0 g/dL Final  . HCT 08/28/2013 22.2* 39.0 - 52.0 % Final  . Order Confirmation 08/28/2013 ORDER PROCESSED BY BLOOD BANK   Final  Infusion on 08/11/2013  Component Date Value Range Status  . Order Confirmation 08/11/2013 ORDER PROCESSED BY BLOOD BANK   Final  . ABO/RH(D) 08/11/2013 O POS   Final  . Antibody Screen 08/11/2013 NEG   Final  . Sample Expiration 08/11/2013 08/14/2013   Final  . Unit Number 08/11/2013 [id number]   Final  . Blood Component Type 08/11/2013 RBC, LR IRR   Final  . Unit division 08/11/2013 00   Final  . Status of Unit 08/11/2013 ISSUED,FINAL   Final  . Transfusion Status 08/11/2013 OK TO TRANSFUSE   Final  . Crossmatch Result 08/11/2013 Compatible   Final  . Unit Number 08/11/2013 [id number]   Final  . Blood Component Type 08/11/2013 RBC, LR IRR   Final  . Unit division 08/11/2013 00   Final  . Status of Unit 08/11/2013 ISSUED,FINAL   Final  . Transfusion Status 08/11/2013 OK TO TRANSFUSE   Final  . Crossmatch Result 08/11/2013 Compatible   Final  Infusion on 08/11/2013  Component Date Value Range Status  . WBC 08/11/2013 7.8  4.0 - 10.5 K/uL Final  . RBC 08/11/2013 2.56* 4.22 - 5.81 MIL/uL Final  . Hemoglobin 08/11/2013 7.9* 13.0 - 17.0 g/dL Final  . HCT 08/11/2013 24.5* 39.0 - 52.0 % Final  . MCV 08/11/2013 95.7  78.0 - 100.0 fL Final  . MCH 08/11/2013 30.9  26.0 - 34.0 pg Final  . MCHC 08/11/2013 32.2  30.0 - 36.0 g/dL Final  . RDW 08/11/2013 19.6* 11.5 - 15.5 % Final  . Platelets 08/11/2013 78* 150 - 400 K/uL Final   CONSISTENT WITH PREVIOUS RESULT  Infusion on 07/30/2013  Component Date Value Range Status  . WBC 07/30/2013 7.9  4.0 - 10.5 K/uL Final  . RBC 07/30/2013 2.26* 4.22 - 5.81 MIL/uL Final  . Hemoglobin 07/30/2013 7.2* 13.0 - 17.0 g/dL Final  . HCT 07/30/2013 22.0* 39.0 - 52.0 % Final  . MCV 07/30/2013 97.3   78.0 - 100.0 fL Final  . MCH 07/30/2013 31.9  26.0 - 34.0 pg Final  . MCHC 07/30/2013 32.7  30.0 - 36.0 g/dL Final  . RDW 07/30/2013 21.3* 11.5 - 15.5 % Final  . Platelets 07/30/2013 52* 150 - 400 K/uL Final   Comment: PLATELET COUNT CONFIRMED BY SMEAR                          PLATELETS APPEAR DECREASED                          LARGE PLATELETS PRESENT  . ABO/RH(D) 07/30/2013 O POS   Final  . Antibody Screen 07/30/2013 NEG   Final  . Sample Expiration 07/30/2013 08/02/2013   Final  . Unit Number 07/30/2013 [id number]   Final  . Blood Component Type 07/30/2013 RBC, LR IRR   Final  . Unit division 07/30/2013 00   Final  . Status of Unit 07/30/2013 ISSUED,FINAL   Final  . Transfusion Status 07/30/2013 OK TO TRANSFUSE   Final  . Crossmatch Result 07/30/2013 Compatible   Final  . Unit Number 07/30/2013 [id number]   Final  . Blood Component Type 07/30/2013 RBC, LR IRR   Final  . Unit division 07/30/2013 00   Final  . Status of Unit 07/30/2013 ISSUED,FINAL   Final  . Transfusion Status 07/30/2013 OK TO TRANSFUSE   Final  . Crossmatch Result 07/30/2013 Compatible   Final  . Order Confirmation 07/30/2013 ORDER PROCESSED BY BLOOD BANK   Final    PATHOLOGY: No new pathology.  Urinalysis    Component Value Date/Time   COLORURINE YELLOW 02/27/2013 1051   APPEARANCEUR CLEAR 02/27/2013 1051   LABSPEC 1.025 02/27/2013 1051   PHURINE 5.5 02/27/2013 1051   GLUCOSEU NEGATIVE 02/27/2013 1051   HGBUR NEGATIVE 02/27/2013 1051   BILIRUBINUR NEGATIVE 02/27/2013 1051   KETONESUR TRACE* 02/27/2013 1051   PROTEINUR TRACE* 02/27/2013 1051   UROBILINOGEN 0.2 02/27/2013 1051   NITRITE NEGATIVE 02/27/2013 1051   LEUKOCYTESUR NEGATIVE 02/27/2013 1051    RADIOGRAPHIC STUDIES: No results found.  ASSESSMENT:  #1. Myelodysplastic syndrome, and stage, still willing to undergo transfusions periodically, not requiring weekly. #2. Coronary artery disease, stable. #3. Shortness of breath at rest with profound  anemia and normal oxygen saturation, not candidate for oxygen therapy technically but I believe because of his systemic disease, he would benefit regarding symptom control, specifically shortness of breath. #4. Terminally ill.   PLAN:  #1. Again the concept of hospice care was initiated during which red cell transfusions will be discontinued. The patient does not wish to participate and wishes to continue to receive transfusions. He was told there was no future and that he would never get better. #2. Hopefully 2 units of packed red blood cells to be  transfused today. #3. Weekly CBC and transfusions as needed. #4. Office visit in 4 weeks. #5. Hopefully oxygen can be provided for palliation.   All questions were answered. The patient knows to call the clinic with any problems, questions or concerns. We can certainly see the patient much sooner if necessary.   I spent 25 minutes counseling the patient face to face. The total time spent in the appointment was 30 minutes.    Doroteo Bradford, MD 08/28/2013 12:02 PM

## 2013-08-29 LAB — TYPE AND SCREEN
ABO/RH(D): O POS
Antibody Screen: NEGATIVE
UNIT DIVISION: 0
Unit division: 0

## 2013-08-31 ENCOUNTER — Telehealth (HOSPITAL_COMMUNITY): Payer: Self-pay | Admitting: *Deleted

## 2013-08-31 NOTE — Telephone Encounter (Signed)
BP 90/42. No dizziness or other symptoms. Family concerned so nurse Lexine Baton called. CBC drawn. I told nurse that the BP was ok.

## 2013-09-14 ENCOUNTER — Telehealth: Payer: Self-pay

## 2013-09-14 NOTE — Telephone Encounter (Signed)
Patient past away @ Home per [facility] in [employer] °

## 2013-09-15 ENCOUNTER — Other Ambulatory Visit (HOSPITAL_COMMUNITY): Payer: Self-pay | Admitting: Oncology

## 2013-09-20 DEATH — deceased

## 2013-09-22 ENCOUNTER — Encounter: Payer: Self-pay | Admitting: Oncology

## 2013-09-24 ENCOUNTER — Ambulatory Visit (HOSPITAL_COMMUNITY): Payer: Medicare Other
